# Patient Record
Sex: Female | Born: 1945 | Race: White | Hispanic: No | Marital: Married | Smoking: Never smoker
Health system: Southern US, Community
[De-identification: ages and names within clinical notes are randomized; demographics above are authoritative.]

## PROBLEM LIST (undated history)

## (undated) DIAGNOSIS — F411 Generalized anxiety disorder: Secondary | ICD-10-CM

## (undated) DIAGNOSIS — R739 Hyperglycemia, unspecified: Secondary | ICD-10-CM

## (undated) DIAGNOSIS — E785 Hyperlipidemia, unspecified: Secondary | ICD-10-CM

## (undated) DIAGNOSIS — I10 Essential (primary) hypertension: Secondary | ICD-10-CM

## (undated) DIAGNOSIS — T7840XA Allergy, unspecified, initial encounter: Secondary | ICD-10-CM

## (undated) HISTORY — PX: TONSILLECTOMY: SUR1361

## (undated) HISTORY — DX: Generalized anxiety disorder: F41.1

## (undated) HISTORY — DX: Essential (primary) hypertension: I10

## (undated) HISTORY — DX: Hyperglycemia, unspecified: R73.9

## (undated) HISTORY — DX: Hyperlipidemia, unspecified: E78.5

## (undated) HISTORY — DX: Allergy, unspecified, initial encounter: T78.40XA

---

## 2007-07-31 ENCOUNTER — Ambulatory Visit: Payer: Self-pay | Admitting: Family Medicine

## 2008-08-11 ENCOUNTER — Ambulatory Visit: Payer: Self-pay | Admitting: Gastroenterology

## 2008-08-13 ENCOUNTER — Ambulatory Visit: Payer: Self-pay | Admitting: Family Medicine

## 2009-12-28 ENCOUNTER — Ambulatory Visit: Payer: Self-pay | Admitting: Family Medicine

## 2012-06-27 ENCOUNTER — Ambulatory Visit: Payer: Self-pay | Admitting: Family Medicine

## 2012-06-27 IMAGING — MG MM CAD SCREENING MAMMO
1 series · 5 of 5 positions shown · non-contrast
Comparison: none

REASON FOR EXAM: SCR MAMMO NO ORDER
COMMENTS:

[R CC · right · 5 of 5 slices shown]
[im 1/5]
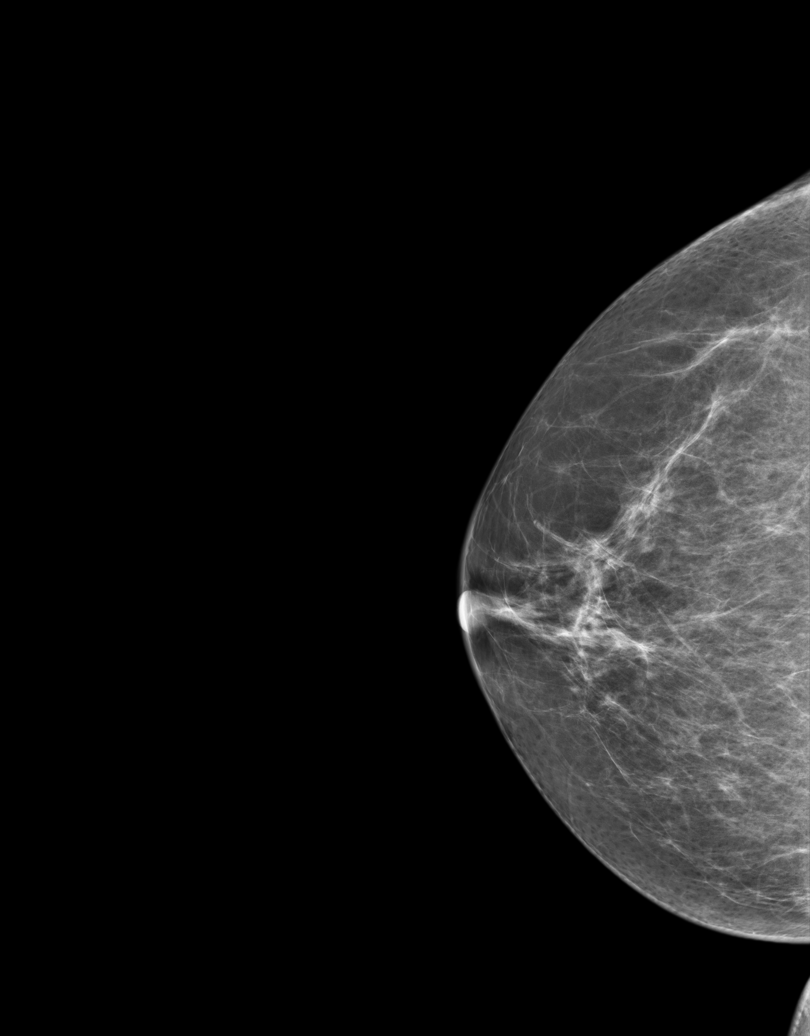
[im 2/5]
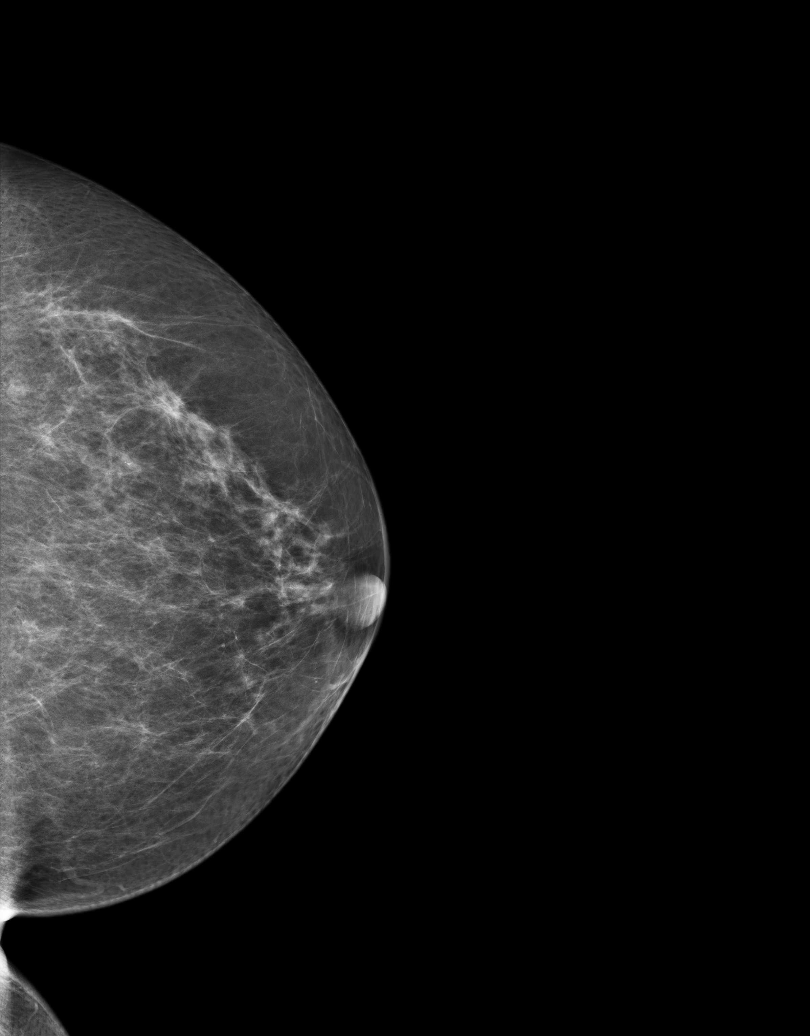
[im 3/5]
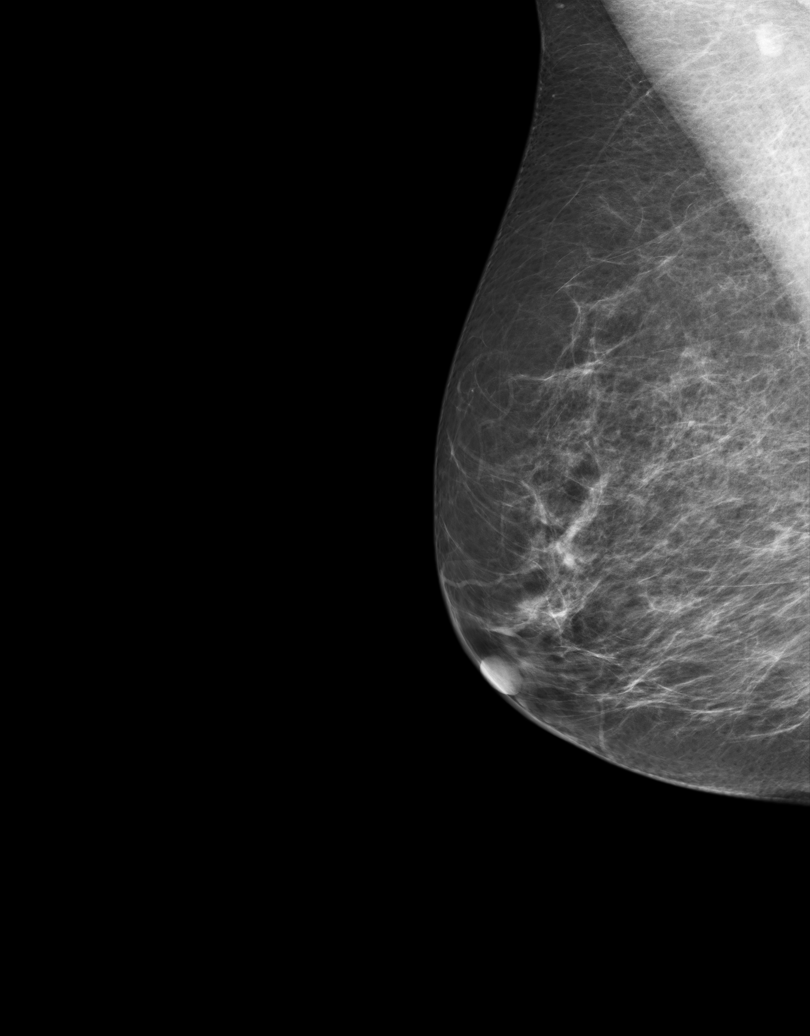
[im 4/5]
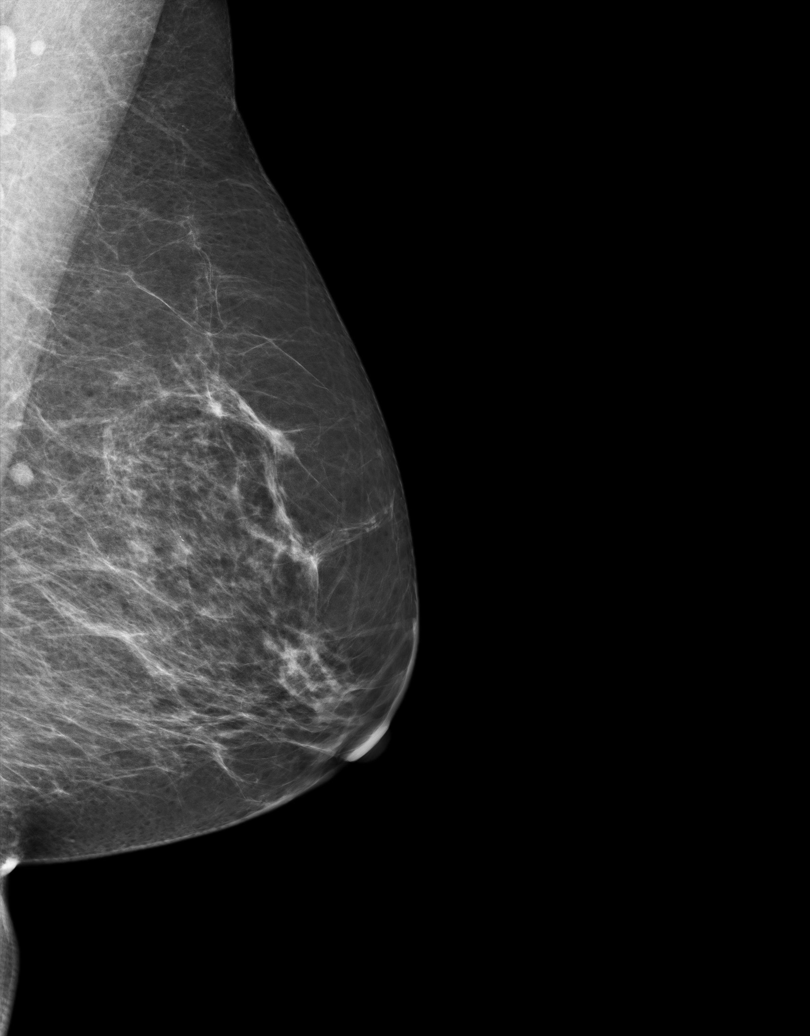
[im 5/5]
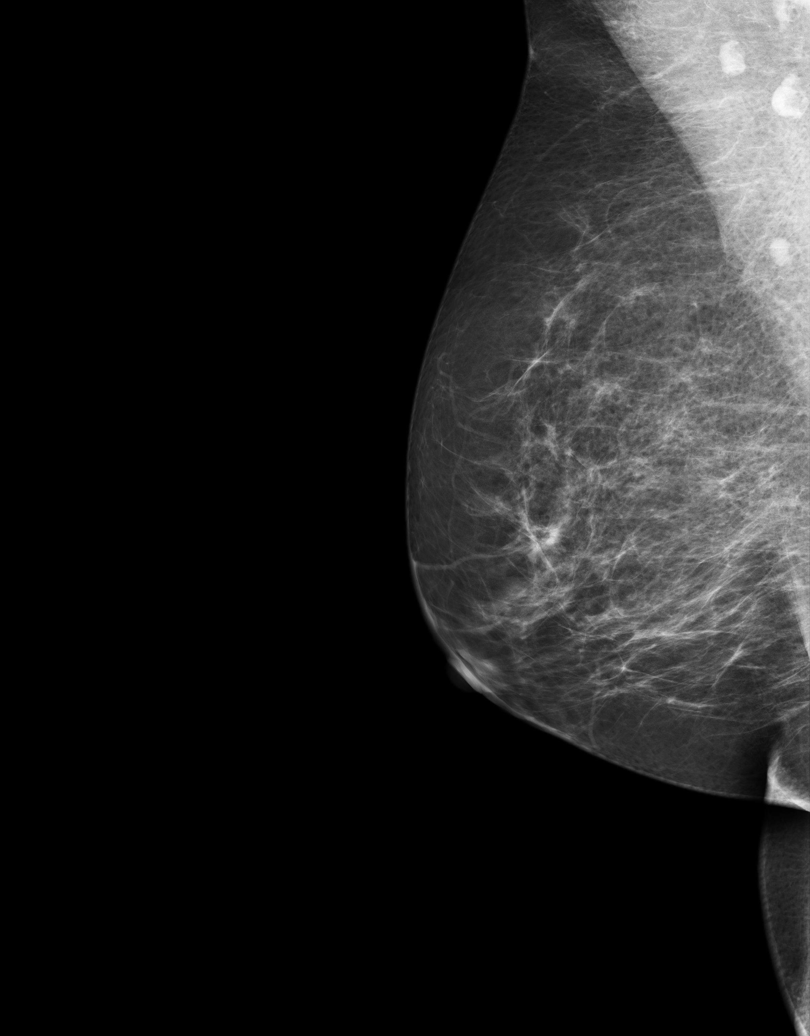

[5 of 5 positions shown; findings below may reference images not displayed]

PROCEDURE:     MAM - MAM DGTL SCRN MAM NO ORDER W/CAD  - [DATE]  [DATE]

RESULT:     There is a family history of breast cancer in the patient's
maternal aunt and maternal grandmother. The patient denies previous breast
surgery or breast cancer. Comparison is made to digital examinations
performed on [DATE] [DATE] [DATE], [DATE] [DATE] [DATE] and [DATE]. There is a
mild parenchymal density without a dominant mass or malignant appearing
calcification. There is stable nodularity in the MLO view of the left breast
posteriorly along the pectoralis region. Some stable central calcifications
are seen in the left breast laterally.
IMPRESSION: 1. Stable, benign appearing bilateral mammogram.

BI-RADS: Category 2 - Benign Finding.

Please continue to encourage annual mammographic follow-up.

A NEGATIVE MAMMOGRAM REPORT DOES NOT PRECLUDE BIOPSY OR OTHER EVALUATION OF
A CLINICALLY PALPABLE OR OTHERWISE SUSPICIOUS MASS OR LESION. BREAST CANCER
MAY NOT BE DETECTED BY MAMMOGRAPHY IN UP TO 10% OF CASES.

[REDACTED]

## 2012-10-09 ENCOUNTER — Ambulatory Visit: Payer: Self-pay | Admitting: Family Medicine

## 2013-01-13 LAB — HM MAMMOGRAPHY: HM Mammogram: NORMAL

## 2013-02-24 ENCOUNTER — Ambulatory Visit: Payer: Self-pay | Admitting: Unknown Physician Specialty

## 2013-02-26 LAB — PATHOLOGY REPORT

## 2013-03-20 LAB — HM COLONOSCOPY

## 2014-10-05 DIAGNOSIS — Z0000 Encounter for general adult medical examination without abnormal findings: Secondary | ICD-10-CM | POA: Diagnosis not present

## 2014-10-05 DIAGNOSIS — Z9181 History of falling: Secondary | ICD-10-CM | POA: Diagnosis not present

## 2014-10-05 DIAGNOSIS — Z1389 Encounter for screening for other disorder: Secondary | ICD-10-CM | POA: Diagnosis not present

## 2014-10-05 DIAGNOSIS — Z13820 Encounter for screening for osteoporosis: Secondary | ICD-10-CM | POA: Diagnosis not present

## 2014-10-05 DIAGNOSIS — Z1211 Encounter for screening for malignant neoplasm of colon: Secondary | ICD-10-CM | POA: Diagnosis not present

## 2015-04-19 ENCOUNTER — Encounter: Payer: Self-pay | Admitting: Family Medicine

## 2015-04-19 ENCOUNTER — Ambulatory Visit (INDEPENDENT_AMBULATORY_CARE_PROVIDER_SITE_OTHER): Payer: Medicare Other | Admitting: Family Medicine

## 2015-04-19 VITALS — BP 118/72 | HR 69 | Temp 97.9°F | Resp 16 | Ht 66.0 in | Wt 173.6 lb

## 2015-04-19 DIAGNOSIS — Z23 Encounter for immunization: Secondary | ICD-10-CM

## 2015-04-19 DIAGNOSIS — Z780 Asymptomatic menopausal state: Secondary | ICD-10-CM | POA: Diagnosis not present

## 2015-04-19 DIAGNOSIS — I10 Essential (primary) hypertension: Secondary | ICD-10-CM

## 2015-04-19 DIAGNOSIS — E785 Hyperlipidemia, unspecified: Secondary | ICD-10-CM | POA: Diagnosis not present

## 2015-04-19 DIAGNOSIS — R9431 Abnormal electrocardiogram [ECG] [EKG]: Secondary | ICD-10-CM

## 2015-04-19 MED ORDER — LOSARTAN POTASSIUM 100 MG PO TABS: 100.0000 mg | ORAL_TABLET | Freq: Every day | ORAL | Status: DC

## 2015-04-19 MED ORDER — SIMVASTATIN 40 MG PO TABS: 40.0000 mg | ORAL_TABLET | Freq: Every day | ORAL | Status: DC

## 2015-04-19 MED ORDER — ASPIRIN 81 MG PO TABS: 81.0000 mg | ORAL_TABLET | Freq: Every day | ORAL | Status: DC

## 2015-04-19 NOTE — Patient Instructions (Signed)
Referral to cardiologist in view of abnormal EKG and risk factors of hyperlipidemia and hypertension and age over 4.

## 2015-04-19 NOTE — Progress Notes (Signed)
Name: Hannah Lindsey   MRN: [id number]    DOB: 01-20-1946   Date:04/19/2015       Progress Note  Subjective  Chief Complaint  Chief Complaint  Patient presents with  . Hyperlipidemia    6 month follow up  . Hypertension    HPI  Hyperlipidemia  Patient has a history of hyperlipidemia for over 5 years.  Current medical regimen consist of simvastatin 40 .  Compliance is good .  Diet and exercise are currently followed moderately well .  Risk factors for cardiovascular disease include hyperlipidemia hypertension relatively sedentary lifestyle .   There have been no side effects from the medication.    Hypertension   Patient presents for follow-up of hypertension. It has been present for over 5 years.  Patient states that there is compliance with medical regimen which consists of losartan 100 mg daily . There is no end organ disease. Cardiac risk factors include hypertension hyperlipidemia and diabetes.  Exercise regimen consist of some walking .  Diet consist of low sodium low-fat the most part  Menopause  Patient is many years postmenopausal. She has no recurrent complaint of menopausal symptoms. It has been over 2 years since her last DEXA scan. .  Past Medical History  Diagnosis Date  . Hypertension   . Psoriasis   . Hyperglycemia   . Allergy   . Hyperlipidemia     Social History  Substance Use Topics  . Smoking status: Never Smoker   . Smokeless tobacco: Not on file  . Alcohol Use: No     Current outpatient prescriptions:  .  Calcium Carbonate-Vitamin D (CALCIUM-VITAMIN D) 500-200 MG-UNIT per tablet, Take 1 tablet by mouth daily., Disp: , Rfl:  .  losartan (COZAAR) 100 MG tablet, Take 1 tablet (100 mg total) by mouth daily., Disp: 90 tablet, Rfl: 1 .  simvastatin (ZOCOR) 40 MG tablet, Take 1 tablet (40 mg total) by mouth daily., Disp: 90 tablet, Rfl: 1  Allergies  Allergen Reactions  . Crestor [Rosuvastatin Calcium] Other (See Comments)    headaches    Review  of Systems  Constitutional: Negative for fever, chills and weight loss.  HENT: Negative for congestion, hearing loss, sore throat and tinnitus.   Eyes: Negative for blurred vision, double vision and redness.  Respiratory: Negative for cough, hemoptysis and shortness of breath.   Cardiovascular: Negative for chest pain, palpitations, orthopnea, claudication and leg swelling.  Gastrointestinal: Negative for heartburn, nausea, vomiting, diarrhea, constipation and blood in stool.  Genitourinary: Negative for dysuria, urgency, frequency and hematuria.  Musculoskeletal: Positive for joint pain. Negative for myalgias, back pain, falls and neck pain.  Skin: Negative for itching.  Neurological: Negative for dizziness, tingling, tremors, focal weakness, seizures, loss of consciousness, weakness and headaches.  Endo/Heme/Allergies: Does not bruise/bleed easily.  Psychiatric/Behavioral: Negative for depression and substance abuse. The patient is not nervous/anxious and does not have insomnia.      Objective  Filed Vitals:   04/19/15 0744  BP: 118/72  Pulse: 69  Temp: 97.9 F (36.6 C)  TempSrc: Oral  Resp: 16  Height: 5\' 6"  (1.676 m)  Weight: 173 lb 9.6 oz (78.744 kg)  SpO2: 98%     Physical Exam  Constitutional: She is oriented to person, place, and time and well-developed, well-nourished, and in no distress.  HENT:  Head: Normocephalic.  Eyes: EOM are normal. Pupils are equal, round, and reactive to light.  Neck: Normal range of motion. No thyromegaly present.  Cardiovascular: Normal rate,  regular rhythm and normal heart sounds.   No murmur heard. Pulmonary/Chest: Effort normal and breath sounds normal.  Abdominal: Soft. Bowel sounds are normal.  Musculoskeletal: Normal range of motion. She exhibits no edema.  Neurological: She is alert and oriented to person, place, and time. No cranial nerve deficit. Gait normal.  Skin: Skin is warm and dry. No rash noted.  Psychiatric: Memory and  affect normal.      Assessment & Plan  1. Hyperlipemia Labs today - Lipid panel  2. Essential hypertension Well-controlled - Comprehensive metabolic panel - TSH  3. Need for influenza vaccination Given today - Flu vaccine HIGH DOSE PF (Fluzone High dose)  4. Menopause Bone density - DG Bone Density; Future  5. Abnormal EKG Referral to cardiologist for consideration for cardiac evaluation as well as stress Myoview - Ambulatory referral to Cardiology

## 2015-04-20 LAB — COMPREHENSIVE METABOLIC PANEL
ALT: 28 IU/L (ref 0–32)
AST: 26 IU/L (ref 0–40)
Albumin/Globulin Ratio: 1.8 (ref 1.1–2.5)
Albumin: 4.7 g/dL (ref 3.6–4.8)
Alkaline Phosphatase: 91 IU/L (ref 39–117)
BUN/Creatinine Ratio: 25 (ref 11–26)
BUN: 21 mg/dL (ref 8–27)
Bilirubin Total: 0.7 mg/dL (ref 0.0–1.2)
CO2: 28 mmol/L (ref 18–29)
Calcium: 10 mg/dL (ref 8.7–10.3)
Chloride: 99 mmol/L (ref 97–108)
Creatinine, Ser: 0.83 mg/dL (ref 0.57–1.00)
GFR calc Af Amer: 84 mL/min/{1.73_m2} (ref >59)
GFR calc non Af Amer: 73 mL/min/{1.73_m2} (ref >59)
Globulin, Total: 2.6 g/dL (ref 1.5–4.5)
Glucose: 110 mg/dL — ABNORMAL HIGH (ref 65–99)
Potassium: 4.9 mmol/L (ref 3.5–5.2)
Sodium: 140 mmol/L (ref 134–144)
Total Protein: 7.3 g/dL (ref 6.0–8.5)

## 2015-04-20 LAB — LIPID PANEL
Chol/HDL Ratio: 3.7 ratio units (ref 0.0–4.4)
Cholesterol, Total: 188 mg/dL (ref 100–199)
HDL: 51 mg/dL (ref >39)
LDL Calculated: 113 mg/dL — ABNORMAL HIGH (ref 0–99)
Triglycerides: 118 mg/dL (ref 0–149)
VLDL Cholesterol Cal: 24 mg/dL (ref 5–40)

## 2015-04-20 LAB — TSH: TSH: 1.31 u[IU]/mL (ref 0.450–4.500)

## 2015-04-21 ENCOUNTER — Telehealth: Payer: Self-pay | Admitting: Emergency Medicine

## 2015-04-21 NOTE — Telephone Encounter (Signed)
Patient notified

## 2015-05-27 ENCOUNTER — Other Ambulatory Visit: Payer: Self-pay | Admitting: Family Medicine

## 2015-06-04 ENCOUNTER — Encounter: Payer: Self-pay | Admitting: Cardiovascular Disease

## 2015-06-04 ENCOUNTER — Ambulatory Visit (INDEPENDENT_AMBULATORY_CARE_PROVIDER_SITE_OTHER): Payer: Medicare Other | Admitting: Cardiovascular Disease

## 2015-06-04 VITALS — BP 126/70 | HR 68 | Ht 66.0 in | Wt 165.0 lb

## 2015-06-04 DIAGNOSIS — I10 Essential (primary) hypertension: Secondary | ICD-10-CM

## 2015-06-04 DIAGNOSIS — R9431 Abnormal electrocardiogram [ECG] [EKG]: Secondary | ICD-10-CM

## 2015-06-04 DIAGNOSIS — R079 Chest pain, unspecified: Secondary | ICD-10-CM | POA: Diagnosis not present

## 2015-06-04 NOTE — Assessment & Plan Note (Signed)
Rare atypical chest pain. Recent EKG was slightly abnormal likely due to lead misplacement. EKG today is normal. I requested a treadmill stress test for evaluation but overall suspicion for heart disease is low.

## 2015-06-04 NOTE — Assessment & Plan Note (Signed)
Blood pressure is controlled on losartan. 

## 2015-06-04 NOTE — Patient Instructions (Signed)
Medication Instructions:  Your physician recommends that you continue on your current medications as directed. Please refer to the Current Medication list given to you today.   Labwork: none  Testing/Procedures: Your physician has requested that you have an exercise tolerance test. For further information please visit [email or web address]. Please also follow instruction sheet, as given.    Follow-Up: Your physician recommends that you schedule a follow-up appointment with Dr. Fletcher Anon as needed.    Any Other Special Instructions Will Be Listed Below (If Applicable).     If you need a refill on your cardiac medications before your next appointment, please call your pharmacy.  Exercise Stress Electrocardiogram An exercise stress electrocardiogram is a test that is done to evaluate the blood supply to your heart. This test may also be called exercise stress electrocardiography. The test is done while you are walking on a treadmill. The goal of this test is to raise your heart rate. This test is done to find areas of poor blood flow to the heart by determining the extent of coronary artery disease (CAD).   CAD is defined as narrowing in one or more heart (coronary) arteries of more than 70%. If you have an abnormal test result, this may mean that you are not getting adequate blood flow to your heart during exercise. Additional testing may be needed to understand why your test was abnormal. LET [facility] CARE PROVIDER KNOW ABOUT:   Any allergies you have.  All medicines you are taking, including vitamins, herbs, eye drops, creams, and over-the-counter medicines.  Previous problems you or members of your family have had with the use of anesthetics.  Any blood disorders you have.  Previous surgeries you have had.  Medical conditions you have.  Possibility of pregnancy, if this applies. RISKS AND COMPLICATIONS Generally, this is a safe procedure. However, as with any procedure,  complications can occur. Possible complications can include:  Pain or pressure in the following areas:  Chest.  Jaw or neck.  Between your shoulder blades.  Radiating down your left arm.  Dizziness or light-headedness.  Shortness of breath.  Increased or irregular heartbeats.  Nausea or vomiting.  Heart attack (rare). BEFORE THE PROCEDURE  Avoid all forms of caffeine 24 hours before your test or as directed by your health care provider. This includes coffee, tea (even decaffeinated tea), caffeinated sodas, chocolate, cocoa, and certain pain medicines.  Follow your health care provider's instructions regarding eating and drinking before the test.  Take your medicines as directed at regular times with water unless instructed otherwise. Exceptions may include:  If you have diabetes, ask how you are to take your insulin or pills. It is common to adjust insulin dosing the morning of the test.  If you are taking beta-blocker medicines, it is important to talk to your health care provider about these medicines well before the date of your test. Taking beta-blocker medicines may interfere with the test. In some cases, these medicines need to be changed or stopped 24 hours or more before the test.  If you wear a nitroglycerin patch, it may need to be removed prior to the test. Ask your health care provider if the patch should be removed before the test.  If you use an inhaler for any breathing condition, bring it with you to the test.  If you are an outpatient, bring a snack so you can eat right after the stress phase of the test.  Do not smoke for 4 hours prior to the  test or as directed by your health care provider.  Do not apply lotions, powders, creams, or oils on your chest prior to the test.  Wear loose-fitting clothes and comfortable shoes for the test. This test involves walking on a treadmill. PROCEDURE  Multiple patches (electrodes) will be put on your chest. If needed,  small areas of your chest may have to be shaved to get better contact with the electrodes. Once the electrodes are attached to your body, multiple wires will be attached to the electrodes and your heart rate will be monitored.  Your heart will be monitored both at rest and while exercising.  You will walk on a treadmill. The treadmill will be started at a slow pace. The treadmill speed and incline will gradually be increased to raise your heart rate. AFTER THE PROCEDURE  Your heart rate and blood pressure will be monitored after the test.  You may return to your normal schedule including diet, activities, and medicines, unless your health care provider tells you otherwise.   This information is not intended to replace advice given to you by your health care provider. Make sure you discuss any questions you have with your health care provider.   Document Released: 07/21/2000 Document Revised: 07/29/2013 Document Reviewed: 03/31/2013 Elsevier Interactive Patient Education [employer].

## 2015-06-04 NOTE — Progress Notes (Signed)
Primary care physician: Dr. Rutherford Nail  HPI  This is a pleasant 69 year old female who was referred for evaluation of an abnormal EKG. She has no previous cardiac history. She has known history of well-controlled hypertension and hyperlipidemia. She is not a smoker. Family history is remarkable for coronary artery disease. Her father had CABG in his 75s. Her mother had rheumatic fever. The patient works at [employer] on her job is overall physical. She was found recently to have an abnormal EKG suggestive of possible old anterior MI. She reports rare episodes of chest pain described as indigestion at rest and not with physical activities. She denies any shortness of breath. No orthopnea, PND or lower extremity edema.  Allergies  Allergen Reactions  . Crestor [Rosuvastatin Calcium] Other (See Comments)    headaches     Current Outpatient Prescriptions on File Prior to Visit  Medication Sig Dispense Refill  . aspirin 81 MG tablet Take 1 tablet (81 mg total) by mouth daily. 30 tablet 12  . Calcium Carbonate-Vitamin D (CALCIUM-VITAMIN D) 500-200 MG-UNIT per tablet Take 1 tablet by mouth daily.    Marland Kitchen losartan (COZAAR) 100 MG tablet TAKE 1 TABLET BY MOUTH DAILY 90 tablet 0  . simvastatin (ZOCOR) 40 MG tablet Take 1 tablet (40 mg total) by mouth daily. 90 tablet 1   No current facility-administered medications on file prior to visit.     Past Medical History  Diagnosis Date  . Hypertension   . Psoriasis   . Hyperglycemia   . Allergy   . Hyperlipidemia      Past Surgical History  Procedure Laterality Date  . Tonsillectomy       Family History  Problem Relation Age of Onset  . Fibromyalgia Mother   . Heart Problems Mother   . Dementia Father   . Heart Problems Father   . Heart disease Father      Social History   Social History  . Marital Status: Married    Spouse Name: N/A  . Number of Children: N/A  . Years of Education: N/A   Occupational History  . Not on file.     Social History Main Topics  . Smoking status: Never Smoker   . Smokeless tobacco: Not on file  . Alcohol Use: 0.0 oz/week    0 Standard drinks or equivalent per week  . Drug Use: No  . Sexual Activity:    Partners: Male   Other Topics Concern  . Not on file   Social History Narrative     ROS A 10 point review of system was performed. It is negative other than that mentioned in the history of present illness.   PHYSICAL EXAM   BP 126/70 mmHg  Pulse 68  Ht 5\' 6"  (1.676 m)  Wt 165 lb (74.844 kg)  BMI 26.64 kg/m2 Constitutional: She is oriented to person, place, and time. She appears well-developed and well-nourished. No distress.  HENT: No nasal discharge.  Head: Normocephalic and atraumatic.  Eyes: Pupils are equal and round. No discharge.  Neck: Normal range of motion. Neck supple. No JVD present. No thyromegaly present.  Cardiovascular: Normal rate, regular rhythm, normal heart sounds. Exam reveals no gallop and no friction rub. No murmur heard.  Pulmonary/Chest: Effort normal and breath sounds normal. No stridor. No respiratory distress. She has no wheezes. She has no rales. She exhibits no tenderness.  Abdominal: Soft. Bowel sounds are normal. She exhibits no distension. There is no tenderness. There is no rebound and  no guarding.  Musculoskeletal: Normal range of motion. She exhibits no edema and no tenderness.  Neurological: She is alert and oriented to person, place, and time. Coordination normal.  Skin: Skin is warm and dry. No rash noted. She is not diaphoretic. No erythema. No pallor.  Psychiatric: She has a normal mood and affect. Her behavior is normal. Judgment and thought content normal.     EKG: Normal sinus rhythm with no significant ST or T wave changes   ASSESSMENT AND PLAN

## 2015-06-07 ENCOUNTER — Ambulatory Visit (INDEPENDENT_AMBULATORY_CARE_PROVIDER_SITE_OTHER): Payer: Medicare Other

## 2015-06-07 DIAGNOSIS — R079 Chest pain, unspecified: Secondary | ICD-10-CM

## 2015-06-09 LAB — EXERCISE TOLERANCE TEST
Estimated workload: 9.9 METS
Exercise duration (min): 7 min
Exercise duration (sec): 56 s
MPHR: 152 {beats}/min
Peak HR: 130 {beats}/min
Percent HR: 85 %
Rest HR: 79 {beats}/min

## 2015-09-05 ENCOUNTER — Other Ambulatory Visit: Payer: Self-pay | Admitting: Family Medicine

## 2015-10-18 ENCOUNTER — Ambulatory Visit: Payer: Medicare Other | Admitting: Family Medicine

## 2015-11-24 ENCOUNTER — Ambulatory Visit: Payer: Medicare Other | Admitting: Family Medicine

## 2016-05-22 ENCOUNTER — Telehealth: Payer: Self-pay | Admitting: Family Medicine

## 2016-05-22 NOTE — Telephone Encounter (Signed)
Called Pt to schedule AWV with NHA and CPE with Manuella Ghazi for 10/23-knb

## 2016-05-29 ENCOUNTER — Ambulatory Visit (INDEPENDENT_AMBULATORY_CARE_PROVIDER_SITE_OTHER): Payer: Medicare Other | Admitting: Family Medicine

## 2016-05-29 VITALS — BP 145/72 | HR 68 | Temp 97.9°F | Ht 64.75 in | Wt 166.5 lb

## 2016-05-29 DIAGNOSIS — Z1159 Encounter for screening for other viral diseases: Secondary | ICD-10-CM

## 2016-05-29 DIAGNOSIS — E162 Hypoglycemia, unspecified: Secondary | ICD-10-CM | POA: Diagnosis not present

## 2016-05-29 DIAGNOSIS — Z23 Encounter for immunization: Secondary | ICD-10-CM | POA: Diagnosis not present

## 2016-05-29 DIAGNOSIS — E7800 Pure hypercholesterolemia, unspecified: Secondary | ICD-10-CM

## 2016-05-29 DIAGNOSIS — Z0000 Encounter for general adult medical examination without abnormal findings: Secondary | ICD-10-CM | POA: Diagnosis not present

## 2016-05-29 LAB — HEPATITIS C ANTIBODY: HCV Ab: NEGATIVE

## 2016-05-29 LAB — TSH: TSH: 1.09 mIU/L

## 2016-05-29 NOTE — Patient Instructions (Signed)
Preventive Care for Adults  A healthy lifestyle and preventive care can promote health and wellness. Preventive health guidelines for adults include the following key practices.  . A routine yearly physical is a good way to check with your health care provider about your health and preventive screening. It is a chance to share any concerns and updates on your health and to receive a thorough exam.  . Visit your dentist for a routine exam and preventive care every 6 months. Brush your teeth twice a day and floss once a day. Good oral hygiene prevents tooth decay and gum disease.  . The frequency of eye exams is based on your age, health, family medical history, use  of contact lenses, and other factors. Follow your health care provider's ecommendations for frequency of eye exams.  . Eat a healthy diet. Foods like vegetables, fruits, whole grains, low-fat dairy products, and lean protein foods contain the nutrients you need without too many calories. Decrease your intake of foods high in solid fats, added sugars, and salt. Eat the right amount of calories for you. Get information about a proper diet from your health care provider, if necessary.  . Regular physical exercise is one of the most important things you can do for your health. Most adults should get at least 150 minutes of moderate-intensity exercise (any activity that increases your heart rate and causes you to sweat) each week. In addition, most adults need muscle-strengthening exercises on 2 or more days a week.  Silver Sneakers may be a benefit available to you. To determine eligibility, you may visit the website: www.silversneakers.com or contact program at 1-866-584-7389 Mon-Fri between 8AM-8PM.   . Maintain a healthy weight. The body mass index (BMI) is a screening tool to identify possible weight problems. It provides an estimate of body fat based on height and weight. Your health care provider can find your BMI and can help you  achieve or maintain a healthy weight.   For adults 20 years and older: ? A BMI below 18.5 is considered underweight. ? A BMI of 18.5 to 24.9 is normal. ? A BMI of 25 to 29.9 is considered overweight. ? A BMI of 30 and above is considered obese.   . Maintain normal blood lipids and cholesterol levels by exercising and minimizing your intake of saturated fat. Eat a balanced diet with plenty of fruit and vegetables. Blood tests for lipids and cholesterol should begin at age 20 and be repeated every 5 years. If your lipid or cholesterol levels are high, you are over 50, or you are at high risk for heart disease, you may need your cholesterol levels checked more frequently. Ongoing high lipid and cholesterol levels should be treated with medicines if diet and exercise are not working.  . If you smoke, find out from your health care provider how to quit. If you do not use tobacco, please do not start.  . If you choose to drink alcohol, please do not consume more than 2 drinks per day. One drink is considered to be 12 ounces (355 mL) of beer, 5 ounces (148 mL) of wine, or 1.5 ounces (44 mL) of liquor.  . If you are 55-79 years old, ask your health care provider if you should take aspirin to prevent strokes.  . Use sunscreen. Apply sunscreen liberally and repeatedly throughout the day. You should seek shade when your shadow is shorter than you. Protect yourself by wearing long sleeves, pants, a wide-brimmed hat, and sunglasses year   round, whenever you are outdoors.  . Once a month, do a whole body skin exam, using a mirror to look at the skin on your back. Tell your health care provider of new moles, moles that have irregular borders, moles that are larger than a pencil eraser, or moles that have changed in shape or color.     Hannah Lindsey , Thank you for taking time to come for your Medicare Wellness Visit. I appreciate your ongoing commitment to your health goals. Please review the following plan  we discussed and let me know if I can assist you in the future.   These are the goals we discussed: Goals    . water intake          Starting 05/29/16, I will continue to drink 6-8 glasses of water a day.       This is a list of the screening recommended for you and due dates:  Health Maintenance  Topic Date Due  . Mammogram  08/06/2016*  .  Hepatitis C: One time screening is recommended by Center for Disease Control  (CDC) for  adults born from [birth year] through 1964.   08/06/2016*  . Tetanus Vaccine  02/28/2022  . Colon Cancer Screening  03/21/2023  . Flu Shot  Completed  . DEXA scan (bone density measurement)  Completed  . Shingles Vaccine  Completed  . Pneumonia vaccines  Completed  *Topic was postponed. The date shown is not the original due date.

## 2016-05-29 NOTE — Progress Notes (Signed)
Subjective:   Hannah Lindsey is a 70 y.o. female who presents for Medicare Annual (Subsequent) preventive examination.  Review of Systems:  N/A Cardiac Risk Factors include: advanced age (>26men, >48 women);dyslipidemia;hypertension     Objective:     Vitals: BP (!) 145/72 (BP Location: Left Arm)   Pulse 68   Temp 97.9 F (36.6 C) (Oral)   Ht 5' 4.75" (1.645 m)   Wt 166 lb 8 oz (75.5 kg)   BMI 27.92 kg/m   Body mass index is 27.92 kg/m.   Tobacco History  Smoking Status  . Never Smoker  Smokeless Tobacco  . Never Used     Counseling given: No   Past Medical History:  Diagnosis Date  . Allergy   . Hyperglycemia   . Hyperlipidemia   . Hypertension   . Psoriasis    Past Surgical History:  Procedure Laterality Date  . TONSILLECTOMY     Family History  Problem Relation Age of Onset  . Fibromyalgia Mother   . Heart Problems Mother   . Dementia Father   . Heart Problems Father   . Heart disease Father    History  Sexual Activity  . Sexual activity: Yes  . Partners: Male    Outpatient Encounter Prescriptions as of 05/29/2016  Medication Sig  . diphenhydrAMINE (BENADRYL) 25 MG tablet Take 25 mg by mouth every 6 (six) hours as needed (as needed).  Marland Kitchen aspirin 81 MG tablet Take 1 tablet (81 mg total) by mouth daily.  . Calcium Carbonate-Vitamin D (CALCIUM-VITAMIN D) 500-200 MG-UNIT per tablet Take 1 tablet by mouth daily.  Marland Kitchen losartan (COZAAR) 100 MG tablet TAKE 1 TABLET BY MOUTH DAILY  . simvastatin (ZOCOR) 40 MG tablet TAKE 1 TABLET (40 MG TOTAL) BY MOUTH DAILY.   No facility-administered encounter medications on file as of 05/29/2016.     Activities of Daily Living In your present state of health, do you have any difficulty performing the following activities: 05/29/2016  Hearing? N  Vision? N  Difficulty concentrating or making decisions? N  Walking or climbing stairs? N  Dressing or bathing? N  Doing errands, shopping? N  Preparing Food and  eating ? N  Using the Toilet? N  In the past six months, have you accidently leaked urine? Y  Do you have problems with loss of bowel control? N  Managing your Medications? N  Managing your Finances? N  Housekeeping or managing your Housekeeping? N  Some recent data might be hidden    Patient Care Team: Ashok Norris, MD as PCP - General (Family Medicine)    Assessment:     Exercise Activities and Dietary recommendations Current Exercise Habits: Home exercise routine, Type of exercise: walking, Time (Minutes): 30, Frequency (Times/Week): 4, Weekly Exercise (Minutes/Week): 120, Intensity: Mild  Goals    . water intake          Starting 05/29/16, I will continue to drink 6-8 glasses of water a day.      Fall Risk Fall Risk  05/29/2016 04/19/2015 04/19/2015  Falls in the past year? No No No   Depression Screen PHQ 2/9 Scores 05/29/2016 04/19/2015  PHQ - 2 Score 0 0     Cognitive Function     6CIT Screen 05/29/2016  What Year? 0 points  What month? 0 points  What time? 0 points  Count back from 20 0 points  Months in reverse 0 points  Repeat phrase 2 points  Total Score 2  Immunization History  Administered Date(s) Administered  . Influenza, High Dose Seasonal PF 04/19/2015  . Influenza-Unspecified 04/07/2014  . Pneumococcal Conjugate-13 10/08/2013  . Pneumococcal Polysaccharide-23 02/29/2012  . Tdap 02/29/2012  . Zoster 04/14/2013   Screening Tests Health Maintenance  Topic Date Due  . MAMMOGRAM  08/06/2016 (Originally 01/14/2015)  . Hepatitis C Screening  08/06/2016 (Originally 1945-11-12)  . TETANUS/TDAP  02/28/2022  . COLONOSCOPY  03/21/2023  . INFLUENZA VACCINE  Completed  . DEXA SCAN  Completed  . ZOSTAVAX  Completed  . PNA vac Low Risk Adult  Completed      6CIT Screen 05/29/2016  What Year? 0 points  What month? 0 points  What time? 0 points  Count back from 20 0 points  Months in reverse 0 points  Repeat phrase 2 points  Total Score 2      Plan:    I have personally reviewed and addressed the Medicare Annual Wellness questionnaire and have noted the following in the patient's chart:  A. Medical and social history B. Use of alcohol, tobacco or illicit drugs  C. Current medications and supplements D. Functional ability and status E.  Nutritional status F.  Physical activity G. Advance directives H. List of other physicians I.  Hospitalizations, surgeries, and ER visits in previous 12 months J.  Roberts such as hearing and vision if needed, cognitive and depression L. Referrals and appointments - none  In addition, I have reviewed and discussed with patient certain preventive protocols, quality metrics, and best practice recommendations. A written personalized care plan for preventive services as well as general preventive health recommendations were provided to patient.  See attached scanned questionnaire for additional information.   Signed,  Fabio Neighbors, LPN Nurse Health Advisor

## 2016-05-30 LAB — LIPID PANEL
Cholesterol: 184 mg/dL (ref 125–200)
HDL: 59 mg/dL (ref >=46)
LDL Cholesterol: 103 mg/dL (ref <130)
Total CHOL/HDL Ratio: 3.1 Ratio (ref <=5.0)
Triglycerides: 109 mg/dL (ref <150)
VLDL: 22 mg/dL (ref <30)

## 2016-05-30 LAB — HEMOGLOBIN A1C
Hgb A1c MFr Bld: 5 % (ref <5.7)
Mean Plasma Glucose: 97 mg/dL

## 2016-06-19 ENCOUNTER — Encounter: Payer: Self-pay | Admitting: Family Medicine

## 2016-06-19 ENCOUNTER — Ambulatory Visit (INDEPENDENT_AMBULATORY_CARE_PROVIDER_SITE_OTHER): Payer: Medicare Other | Admitting: Family Medicine

## 2016-06-19 VITALS — BP 141/76 | HR 81 | Temp 98.2°F | Resp 17 | Ht 65.0 in | Wt 167.5 lb

## 2016-06-19 DIAGNOSIS — E785 Hyperlipidemia, unspecified: Secondary | ICD-10-CM | POA: Diagnosis not present

## 2016-06-19 DIAGNOSIS — I10 Essential (primary) hypertension: Secondary | ICD-10-CM | POA: Diagnosis not present

## 2016-06-19 MED ORDER — LOSARTAN POTASSIUM 100 MG PO TABS: 100.0000 mg | ORAL_TABLET | Freq: Every day | ORAL | 0 refills | Status: DC

## 2016-06-19 MED ORDER — SIMVASTATIN 40 MG PO TABS: ORAL_TABLET | ORAL | 0 refills | Status: DC

## 2016-06-19 NOTE — Progress Notes (Signed)
Name: Hannah Lindsey   MRN: [id number]    DOB: [birth year]   Date:06/19/2016       Progress Note  Subjective  Chief Complaint  Chief Complaint  Patient presents with  . Medication Refill    simvastatin / losartan    Hypertension  This is a chronic problem. The problem is unchanged. The problem is controlled. Pertinent negatives include no blurred vision, chest pain, headaches, palpitations or shortness of breath. Past treatments include angiotensin blockers. There is no history of kidney disease, CAD/MI or CVA.  Hyperlipidemia  This is a chronic problem. The problem is controlled. Recent lipid tests were reviewed and are normal. Pertinent negatives include no chest pain, leg pain, myalgias or shortness of breath. Current antihyperlipidemic treatment includes statins. Risk factors for coronary artery disease include dyslipidemia.    Past Medical History:  Diagnosis Date  . Allergy   . Hyperglycemia   . Hyperlipidemia   . Hypertension   . Psoriasis     Past Surgical History:  Procedure Laterality Date  . TONSILLECTOMY      Family History  Problem Relation Age of Onset  . Fibromyalgia Mother   . Heart Problems Mother   . Dementia Father   . Heart Problems Father   . Heart disease Father     Social History   Social History  . Marital status: Married    Spouse name: N/A  . Number of children: N/A  . Years of education: N/A   Occupational History  . Not on file.   Social History Main Topics  . Smoking status: Never Smoker  . Smokeless tobacco: Never Used  . Alcohol use 0.6 oz/week    1 Glasses of wine per week     Comment: nightly  . Drug use: No  . Sexual activity: Yes    Partners: Male   Other Topics Concern  . Not on file   Social History Narrative  . No narrative on file     Current Outpatient Prescriptions:  .  aspirin 81 MG tablet, Take 1 tablet (81 mg total) by mouth daily., Disp: 30 tablet, Rfl: 12 .  Calcium Carbonate-Vitamin D  (CALCIUM-VITAMIN D) 500-200 MG-UNIT per tablet, Take 1 tablet by mouth daily., Disp: , Rfl:  .  diphenhydrAMINE (BENADRYL) 25 MG tablet, Take 25 mg by mouth every 6 (six) hours as needed (as needed)., Disp: , Rfl:  .  losartan (COZAAR) 100 MG tablet, TAKE 1 TABLET BY MOUTH DAILY, Disp: 90 tablet, Rfl: 0 .  simvastatin (ZOCOR) 40 MG tablet, TAKE 1 TABLET (40 MG TOTAL) BY MOUTH DAILY., Disp: 90 tablet, Rfl: 0  Allergies  Allergen Reactions  . Crestor [Rosuvastatin Calcium] Other (See Comments)    headaches     Review of Systems  Eyes: Negative for blurred vision.  Respiratory: Negative for shortness of breath.   Cardiovascular: Negative for chest pain and palpitations.  Musculoskeletal: Negative for myalgias.  Neurological: Negative for headaches.    Objective  Vitals:   06/19/16 1022  BP: (!) 141/76  Pulse: 81  Resp: 17  Temp: 98.2 F (36.8 C)  TempSrc: Oral  SpO2: 97%  Weight: 167 lb 8 oz (76 kg)  Height: 5\' 5"  (1.651 m)    Physical Exam  Constitutional: She is well-developed, well-nourished, and in no distress.  Cardiovascular: Normal rate, regular rhythm, S1 normal and S2 normal.   No murmur heard. Pulmonary/Chest: Breath sounds normal. She has no wheezes. She has no rhonchi.  Abdominal: Soft. Bowel  sounds are normal. There is no tenderness.  Musculoskeletal:       Right ankle: She exhibits no swelling.       Left ankle: She exhibits no swelling.  Psychiatric: Mood, memory, affect and judgment normal.  Nursing note and vitals reviewed.    Assessment & Plan  1. Essential hypertension BP stable and controlled on present therapy - losartan (COZAAR) 100 MG tablet; Take 1 tablet (100 mg total) by mouth daily.  Dispense: 90 tablet; Refill: 0  2. Hyperlipidemia, unspecified hyperlipidemia type FLP at goal, continue on statin - simvastatin (ZOCOR) 40 MG tablet; TAKE 1 TABLET (40 MG TOTAL) BY MOUTH DAILY.  Dispense: 90 tablet; Refill: 0   Toyna Erisman Asad A.  [facility] 06/19/2016 10:39 AM

## 2016-06-26 ENCOUNTER — Other Ambulatory Visit: Payer: Self-pay | Admitting: Family Medicine

## 2016-06-26 DIAGNOSIS — I10 Essential (primary) hypertension: Secondary | ICD-10-CM

## 2016-06-30 ENCOUNTER — Other Ambulatory Visit: Payer: Self-pay | Admitting: Family Medicine

## 2016-06-30 DIAGNOSIS — E785 Hyperlipidemia, unspecified: Secondary | ICD-10-CM

## 2016-08-11 ENCOUNTER — Encounter: Payer: Medicare Other | Admitting: Family Medicine

## 2016-09-06 ENCOUNTER — Encounter: Payer: Self-pay | Admitting: Family Medicine

## 2016-09-06 ENCOUNTER — Ambulatory Visit (INDEPENDENT_AMBULATORY_CARE_PROVIDER_SITE_OTHER): Payer: Medicare Other | Admitting: Family Medicine

## 2016-09-06 DIAGNOSIS — Z780 Asymptomatic menopausal state: Secondary | ICD-10-CM

## 2016-09-06 DIAGNOSIS — Z1239 Encounter for other screening for malignant neoplasm of breast: Secondary | ICD-10-CM

## 2016-09-06 DIAGNOSIS — Z1231 Encounter for screening mammogram for malignant neoplasm of breast: Secondary | ICD-10-CM

## 2016-09-06 DIAGNOSIS — Z0000 Encounter for general adult medical examination without abnormal findings: Secondary | ICD-10-CM

## 2016-09-06 NOTE — Progress Notes (Signed)
Name: Hannah Lindsey   MRN: [id number]    DOB: 26-May-1946   Date:09/06/2016       Progress Note  Subjective  Chief Complaint  Chief Complaint  Patient presents with  . Annual Exam    HPI  Pt. Presents for Complete Physical Exam She will need mammogram and DEXA scan. She is up to date with colonoscopy, due in 2021.  Past Medical History:  Diagnosis Date  . Allergy   . Hyperglycemia   . Hyperlipidemia   . Hypertension   . Psoriasis     Past Surgical History:  Procedure Laterality Date  . TONSILLECTOMY      Family History  Problem Relation Age of Onset  . Fibromyalgia Mother   . Heart Problems Mother   . Dementia Father   . Heart Problems Father   . Heart disease Father     Social History   Social History  . Marital status: Married    Spouse name: N/A  . Number of children: N/A  . Years of education: N/A   Occupational History  . Not on file.   Social History Main Topics  . Smoking status: Never Smoker  . Smokeless tobacco: Never Used  . Alcohol use 0.6 oz/week    1 Glasses of wine per week     Comment: nightly  . Drug use: No  . Sexual activity: Yes    Partners: Male   Other Topics Concern  . Not on file   Social History Narrative  . No narrative on file     Current Outpatient Prescriptions:  .  aspirin 81 MG tablet, Take 1 tablet (81 mg total) by mouth daily., Disp: 30 tablet, Rfl: 12 .  Calcium Carbonate-Vitamin D (CALCIUM-VITAMIN D) 500-200 MG-UNIT per tablet, Take 1 tablet by mouth daily., Disp: , Rfl:  .  diphenhydrAMINE (BENADRYL) 25 MG tablet, Take 25 mg by mouth every 6 (six) hours as needed (as needed)., Disp: , Rfl:  .  losartan (COZAAR) 100 MG tablet, Take 1 tablet (100 mg total) by mouth daily., Disp: 90 tablet, Rfl: 0 .  simvastatin (ZOCOR) 40 MG tablet, TAKE 1 TABLET (40 MG TOTAL) BY MOUTH DAILY., Disp: 90 tablet, Rfl: 0  Allergies  Allergen Reactions  . Crestor [Rosuvastatin Calcium] Other (See Comments)    headaches      Review of Systems  Constitutional: Negative for chills, fever, malaise/fatigue and weight loss.  HENT: Negative for congestion, ear pain and sinus pain.   Eyes: Negative for blurred vision and double vision.  Respiratory: Negative for cough, shortness of breath and wheezing.   Cardiovascular: Negative for chest pain and leg swelling.  Gastrointestinal: Negative for blood in stool, constipation, nausea and vomiting.  Genitourinary: Negative for dysuria and hematuria.  Musculoskeletal: Positive for back pain (occasional back spasms). Negative for joint pain and neck pain.  Neurological: Negative for headaches.  Psychiatric/Behavioral: Negative for depression. The patient is not nervous/anxious and does not have insomnia.     Objective  Vitals:   09/06/16 1043  BP: 112/68  Pulse: 77  Resp: 16  Temp: 98.1 F (36.7 C)  TempSrc: Oral  SpO2: 97%  Weight: 168 lb 4.8 oz (76.3 kg)  Height: 5\' 5"  (1.651 m)    Physical Exam  Constitutional: She is oriented to person, place, and time and well-developed, well-nourished, and in no distress.  HENT:  Head: Normocephalic and atraumatic.  Right Ear: Tympanic membrane and ear canal normal. No drainage or swelling.  Mouth/Throat: No  posterior oropharyngeal erythema.  Left ear canal with cerumen impaction.  Eyes: Pupils are equal, round, and reactive to light.  Cardiovascular: Normal rate, regular rhythm, S1 normal, S2 normal and normal heart sounds.   No murmur heard. Pulmonary/Chest: Effort normal and breath sounds normal. She has no wheezes.  Abdominal: Soft. Bowel sounds are normal. There is no tenderness.  Musculoskeletal:       Right ankle: She exhibits no swelling.       Left ankle: She exhibits no swelling.  Neurological: She is alert and oriented to person, place, and time.  Skin: Skin is warm, dry and intact.  Psychiatric: Mood, memory, affect and judgment normal.  Nursing note and vitals reviewed.    Assessment &  Plan  1. Well woman exam (no gynecological exam)  2. Screening for breast cancer  - MM DIGITAL SCREENING BILATERAL; Future  3. Post-menopausal  - DG Bone Density; Future   Saga Balthazar Asad A. [facility] 09/06/2016 11:05 AM

## 2016-10-19 ENCOUNTER — Other Ambulatory Visit: Payer: Self-pay | Admitting: Family Medicine

## 2016-10-19 DIAGNOSIS — I10 Essential (primary) hypertension: Secondary | ICD-10-CM

## 2016-10-19 DIAGNOSIS — E785 Hyperlipidemia, unspecified: Secondary | ICD-10-CM

## 2016-10-19 NOTE — Telephone Encounter (Signed)
Last Cr, K+, and ALT were 2016; please ask patient to stop by NON-fasting sometime this week or next to just check kidney and liver tests on these medicines I'll be glad to re-order if she'll be able to do that for [place] [facility]: please enter CMP with GFR, diagnosis medication monitor encounter Thank you

## 2016-12-06 ENCOUNTER — Ambulatory Visit
Admission: RE | Admit: 2016-12-06 | Discharge: 2016-12-06 | Disposition: A | Discharge status: Home / Self Care | Payer: Medicare Other | Source: Ambulatory Visit | Attending: Family Medicine | Admitting: Family Medicine

## 2016-12-06 DIAGNOSIS — M8588 Other specified disorders of bone density and structure, other site: Secondary | ICD-10-CM | POA: Diagnosis not present

## 2016-12-06 DIAGNOSIS — Z780 Asymptomatic menopausal state: Secondary | ICD-10-CM

## 2016-12-06 DIAGNOSIS — Z1239 Encounter for other screening for malignant neoplasm of breast: Secondary | ICD-10-CM

## 2016-12-06 DIAGNOSIS — Z1231 Encounter for screening mammogram for malignant neoplasm of breast: Secondary | ICD-10-CM | POA: Insufficient documentation

## 2016-12-06 IMAGING — MG MM DIGITAL SCREENING BILAT W/ CAD
4 series · 4 of 4 positions shown · non-contrast
Comparison: Previous exam(s).

CLINICAL DATA: Screening.

EXAM:
DIGITAL SCREENING BILATERAL MAMMOGRAM WITH CAD

[R CC]
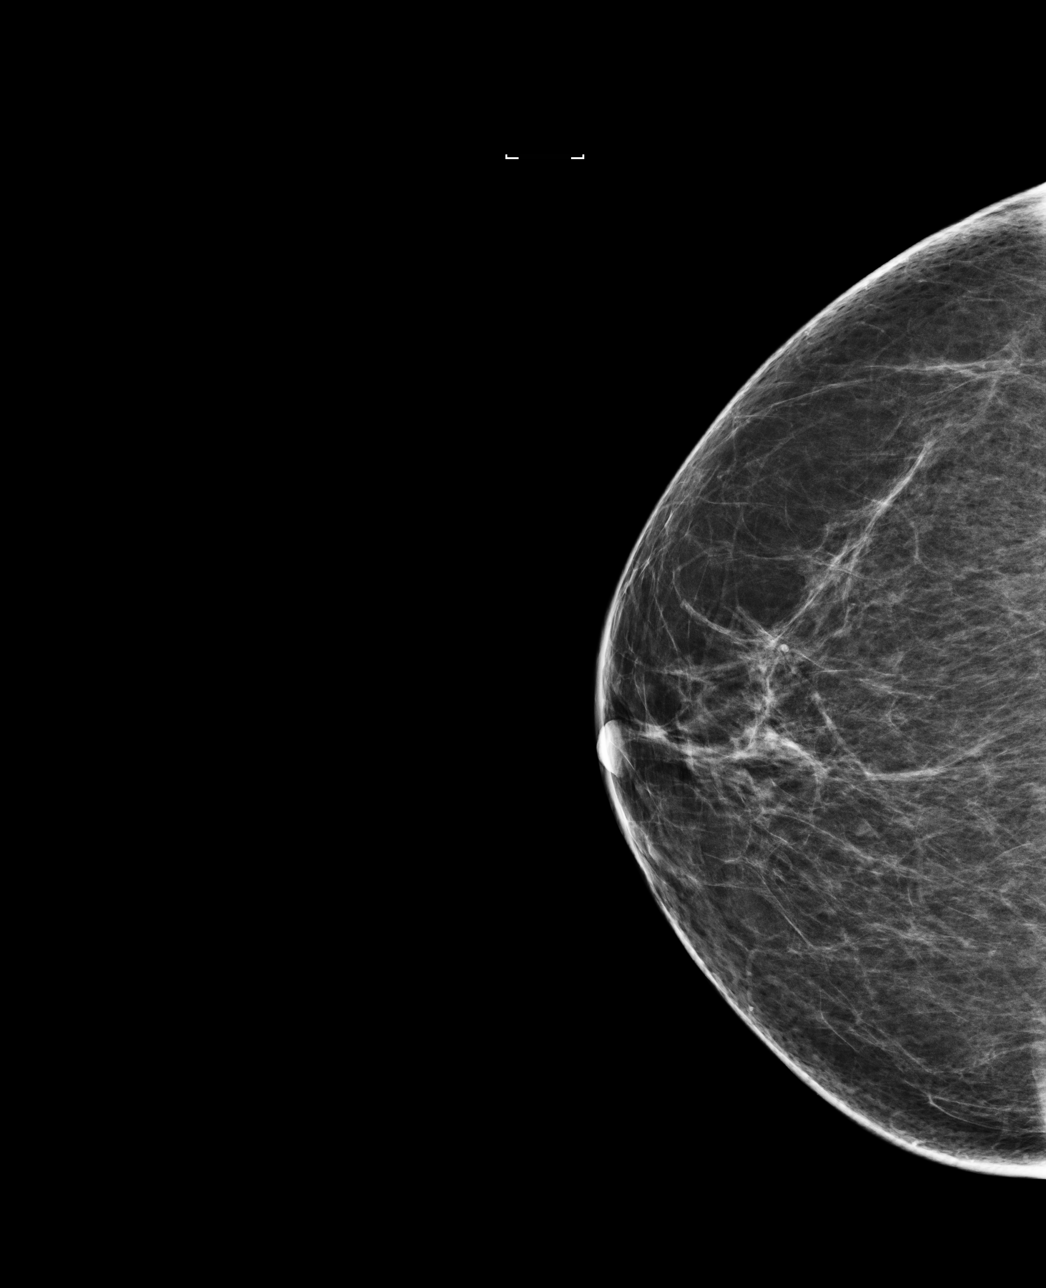

[L MLO]
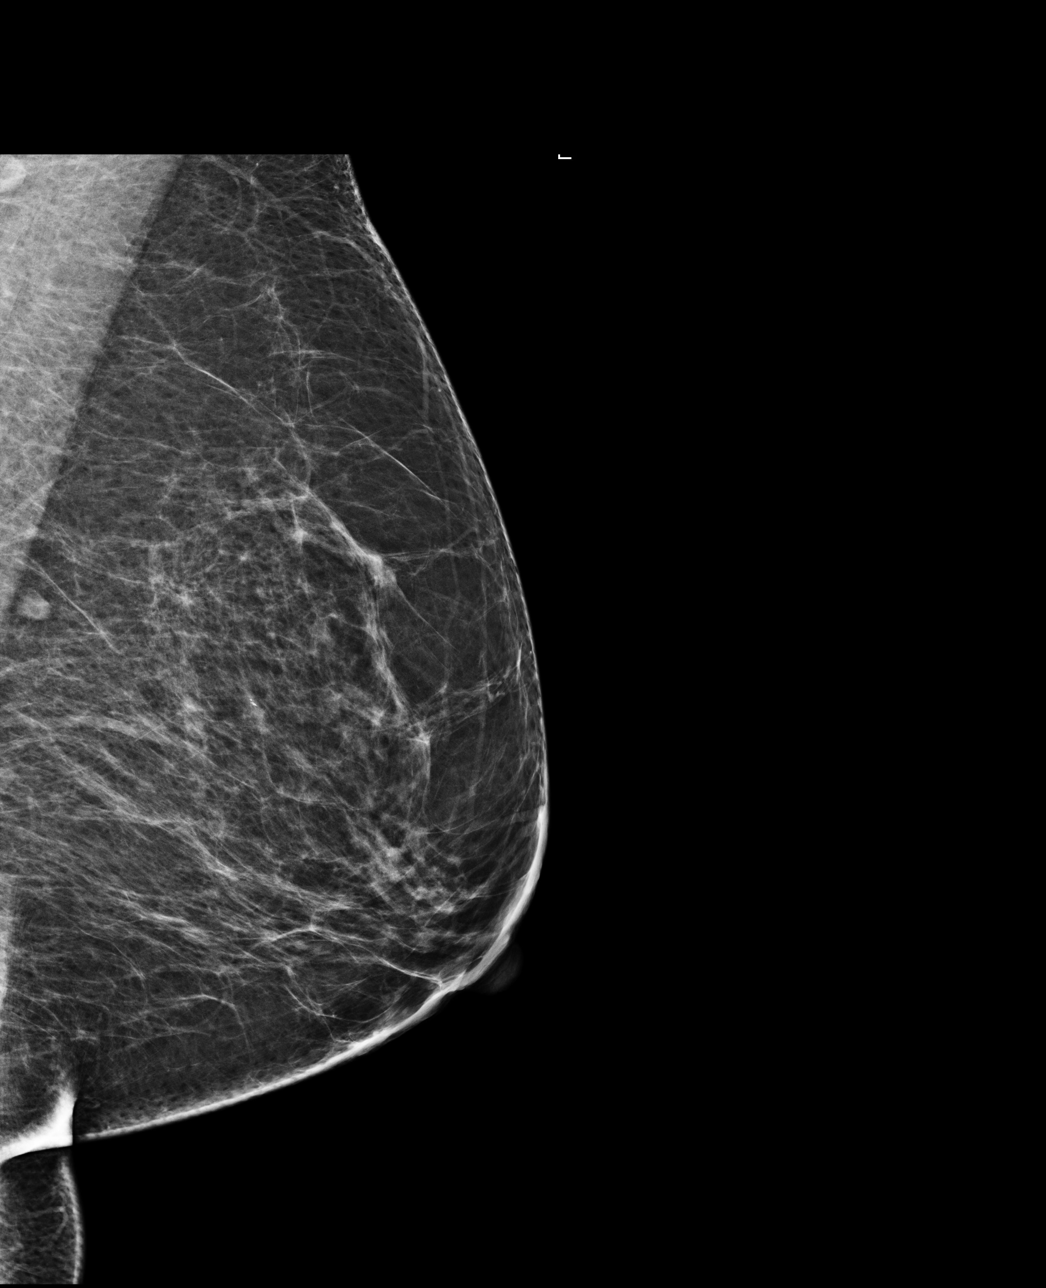

[R MLO]
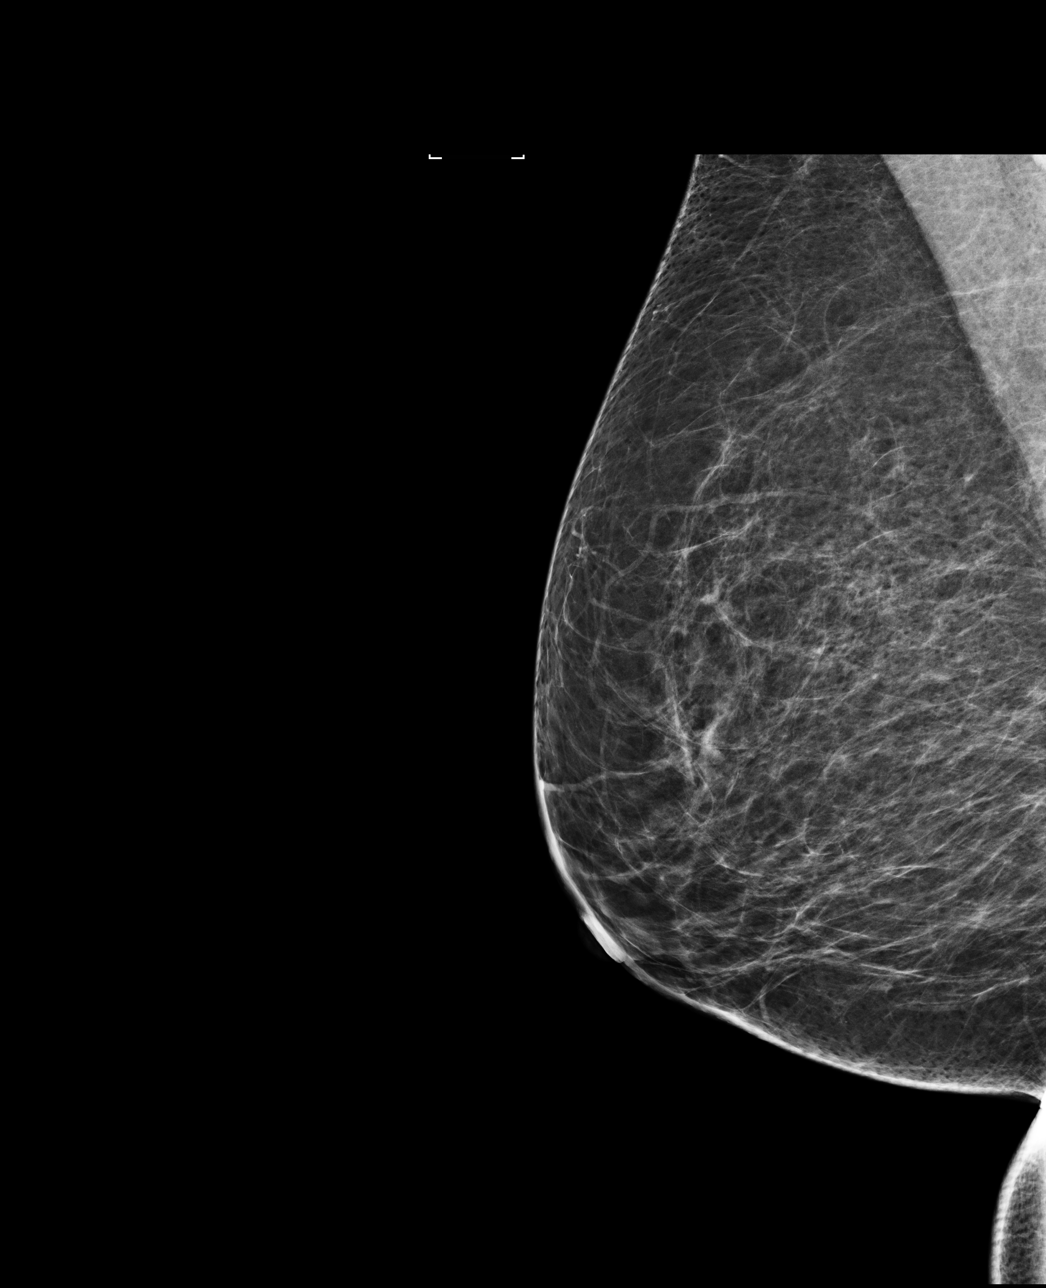

[L CC]
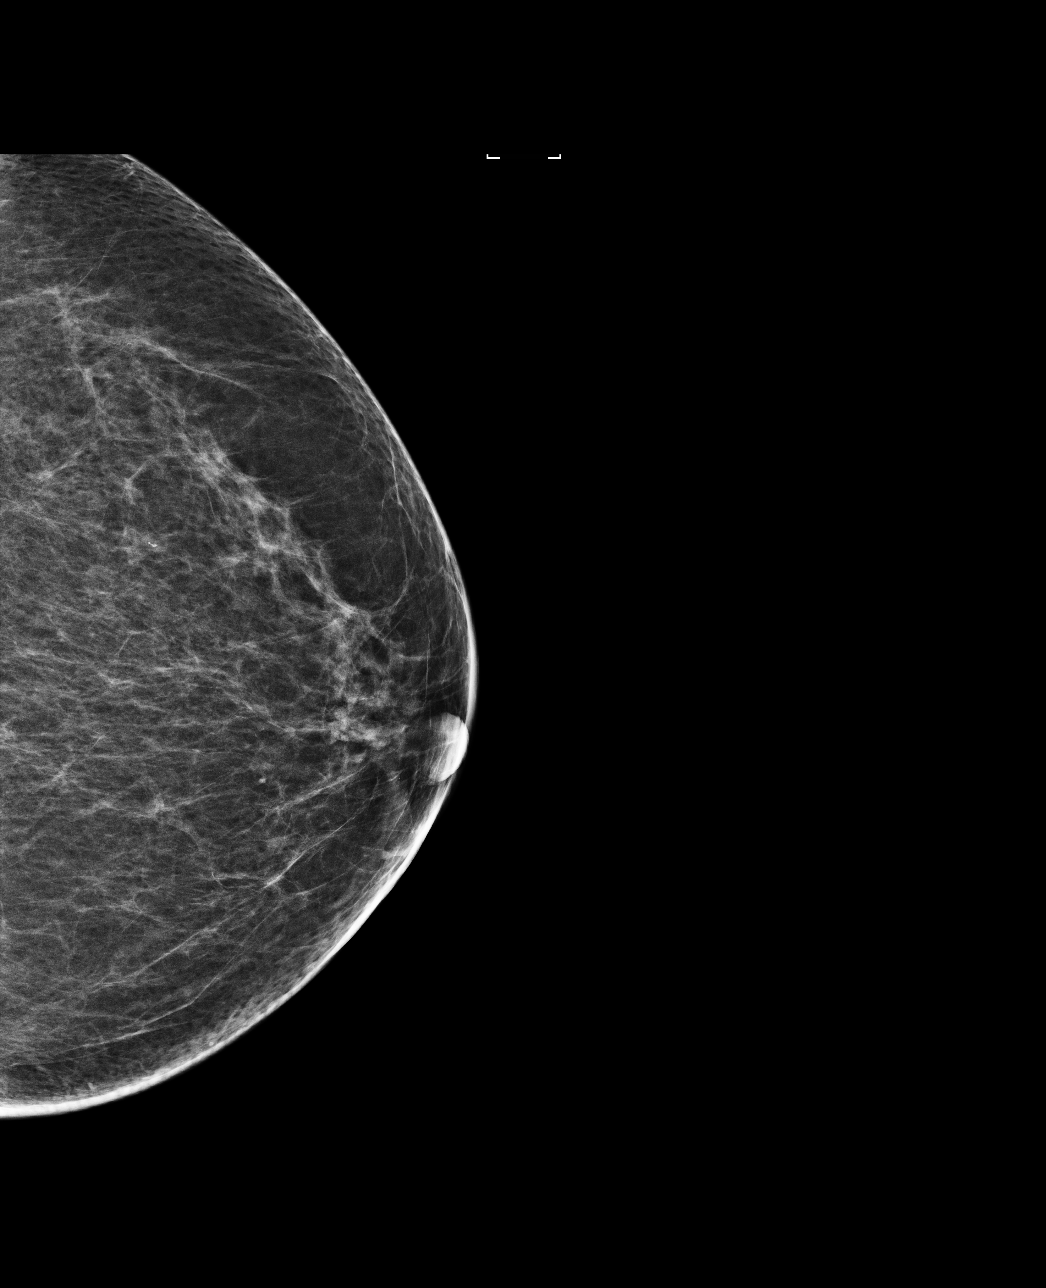

[4 of 4 positions shown; findings below may reference images not displayed]

ACR Breast Density Category b: There are scattered areas of
fibroglandular density.
FINDINGS: There are no findings suspicious for malignancy. Images were
processed with CAD.
IMPRESSION: No mammographic evidence of malignancy. A result letter of this
screening mammogram will be mailed directly to the patient.

RECOMMENDATION:
Screening mammogram in one year. (Code:[US])

BI-RADS CATEGORY  1: Negative.

## 2017-01-15 ENCOUNTER — Ambulatory Visit (INDEPENDENT_AMBULATORY_CARE_PROVIDER_SITE_OTHER): Payer: Medicare Other | Admitting: Family Medicine

## 2017-01-15 ENCOUNTER — Encounter: Payer: Self-pay | Admitting: Family Medicine

## 2017-01-15 DIAGNOSIS — E785 Hyperlipidemia, unspecified: Secondary | ICD-10-CM | POA: Diagnosis not present

## 2017-01-15 DIAGNOSIS — I10 Essential (primary) hypertension: Secondary | ICD-10-CM | POA: Diagnosis not present

## 2017-01-15 LAB — LIPID PANEL
Cholesterol: 178 mg/dL (ref <200)
HDL: 54 mg/dL (ref >50)
LDL Cholesterol: 106 mg/dL — ABNORMAL HIGH (ref <100)
Total CHOL/HDL Ratio: 3.3 Ratio (ref <5.0)
Triglycerides: 91 mg/dL (ref <150)
VLDL: 18 mg/dL (ref <30)

## 2017-01-15 MED ORDER — SIMVASTATIN 40 MG PO TABS: 40.0000 mg | ORAL_TABLET | Freq: Every day | ORAL | 0 refills | Status: DC

## 2017-01-15 MED ORDER — LOSARTAN POTASSIUM 100 MG PO TABS: 100.0000 mg | ORAL_TABLET | Freq: Every day | ORAL | 0 refills | Status: DC

## 2017-01-15 NOTE — Progress Notes (Signed)
Name: LEEONA MCCARDLE   MRN: [id number]    DOB: 04-05-1946   Date:01/15/2017       Progress Note  Subjective  Chief Complaint  Chief Complaint  Patient presents with  . Medication Refill    Hypertension  This is a chronic problem. The problem is unchanged. The problem is controlled. Pertinent negatives include no blurred vision, palpitations or shortness of breath. Past treatments include angiotensin blockers. There is no history of kidney disease, CAD/MI or CVA.  Hyperlipidemia  This is a chronic problem. The problem is controlled. Recent lipid tests were reviewed and are normal. Pertinent negatives include no leg pain or shortness of breath. Current antihyperlipidemic treatment includes statins. Risk factors for coronary artery disease include dyslipidemia.      Past Medical History:  Diagnosis Date  . Allergy   . Hyperglycemia   . Hyperlipidemia   . Hypertension   . Psoriasis     Past Surgical History:  Procedure Laterality Date  . TONSILLECTOMY      Family History  Problem Relation Age of Onset  . Fibromyalgia Mother   . Heart Problems Mother   . Dementia Father   . Heart Problems Father   . Heart disease Father   . Breast cancer Maternal Grandmother     Social History   Social History  . Marital status: Married    Spouse name: N/A  . Number of children: N/A  . Years of education: N/A   Occupational History  . Not on file.   Social History Main Topics  . Smoking status: Never Smoker  . Smokeless tobacco: Never Used  . Alcohol use 0.6 oz/week    1 Glasses of wine per week     Comment: nightly  . Drug use: No  . Sexual activity: Yes    Partners: Male   Other Topics Concern  . Not on file   Social History Narrative  . No narrative on file     Current Outpatient Prescriptions:  .  aspirin 81 MG tablet, Take 1 tablet (81 mg total) by mouth daily., Disp: 30 tablet, Rfl: 12 .  Calcium Carbonate-Vitamin D (CALCIUM-VITAMIN D) 500-200 MG-UNIT per  tablet, Take 1 tablet by mouth daily., Disp: , Rfl:  .  diphenhydrAMINE (BENADRYL) 25 MG tablet, Take 25 mg by mouth every 6 (six) hours as needed (as needed)., Disp: , Rfl:  .  losartan (COZAAR) 100 MG tablet, TAKE ONE TABLET BY MOUTH DAILY, Disp: 90 tablet, Rfl: 0 .  simvastatin (ZOCOR) 40 MG tablet, TAKE ONE TABLET BY MOUTH DAILY, Disp: 90 tablet, Rfl: 0  Allergies  Allergen Reactions  . Crestor [Rosuvastatin Calcium] Other (See Comments)    headaches     Review of Systems  Eyes: Negative for blurred vision.  Respiratory: Negative for shortness of breath.   Cardiovascular: Negative for palpitations.      Objective  Vitals:   01/15/17 0818  BP: 114/78  Pulse: 73  Resp: 16  Temp: 97.8 F (36.6 C)  TempSrc: Oral  SpO2: 96%  Weight: 171 lb 6.4 oz (77.7 kg)  Height: 5\' 5"  (1.651 m)    Physical Exam  Constitutional: She is oriented to person, place, and time and well-developed, well-nourished, and in no distress.  HENT:  Head: Normocephalic and atraumatic.  Cardiovascular: Normal rate, regular rhythm, S1 normal and S2 normal.   No murmur heard. Pulmonary/Chest: Breath sounds normal. She has no wheezes. She has no rhonchi.  Abdominal: Soft. Bowel sounds are normal. There  is no tenderness.  Musculoskeletal: She exhibits no edema.       Right ankle: She exhibits no swelling.       Left ankle: She exhibits no swelling.  Neurological: She is alert and oriented to person, place, and time.  Psychiatric: Mood, memory, affect and judgment normal.  Nursing note and vitals reviewed.    Assessment & Plan  1. Essential hypertension BP stable on present antihypertensive - losartan (COZAAR) 100 MG tablet; Take 1 tablet (100 mg total) by mouth daily.  Dispense: 90 tablet; Refill: 0  2. Hyperlipidemia, unspecified hyperlipidemia type Stable FLP, repeat today - simvastatin (ZOCOR) 40 MG tablet; Take 1 tablet (40 mg total) by mouth daily.  Dispense: 90 tablet; Refill: 0 - Lipid  panel   Anylah Scheib Asad A. [facility] 01/15/2017 8:34 AM

## 2017-05-09 ENCOUNTER — Ambulatory Visit (INDEPENDENT_AMBULATORY_CARE_PROVIDER_SITE_OTHER): Payer: Medicare Other | Admitting: Family Medicine

## 2017-05-09 ENCOUNTER — Encounter: Payer: Self-pay | Admitting: Family Medicine

## 2017-05-09 VITALS — BP 128/64 | HR 86 | Temp 98.0°F | Ht 65.0 in | Wt 167.7 lb

## 2017-05-09 DIAGNOSIS — E785 Hyperlipidemia, unspecified: Secondary | ICD-10-CM | POA: Diagnosis not present

## 2017-05-09 DIAGNOSIS — I10 Essential (primary) hypertension: Secondary | ICD-10-CM | POA: Diagnosis not present

## 2017-05-09 DIAGNOSIS — Z23 Encounter for immunization: Secondary | ICD-10-CM | POA: Diagnosis not present

## 2017-05-09 DIAGNOSIS — H6122 Impacted cerumen, left ear: Secondary | ICD-10-CM | POA: Diagnosis not present

## 2017-05-09 MED ORDER — SIMVASTATIN 40 MG PO TABS: 40.0000 mg | ORAL_TABLET | Freq: Every day | ORAL | 0 refills | Status: DC

## 2017-05-09 MED ORDER — LOSARTAN POTASSIUM 100 MG PO TABS: 100.0000 mg | ORAL_TABLET | Freq: Every day | ORAL | 0 refills | Status: DC

## 2017-05-09 NOTE — Progress Notes (Signed)
Name: Hannah Lindsey   MRN: [id number]    DOB: 08/12/1945   Date:05/09/2017       Progress Note  Subjective  Chief Complaint  Chief Complaint  Patient presents with  . Hyperlipidemia  . Hypertension    Hyperlipidemia  This is a chronic problem. The problem is uncontrolled. Recent lipid tests were reviewed and are high (elevated LDL). Pertinent negatives include no chest pain, leg pain or shortness of breath. Current antihyperlipidemic treatment includes statins. Risk factors for coronary artery disease include dyslipidemia.  Hypertension  This is a chronic problem. The problem is unchanged. The problem is controlled. Pertinent negatives include no blurred vision, chest pain, headaches, neck pain, palpitations or shortness of breath. Past treatments include angiotensin blockers. There is no history of kidney disease, CAD/MI or CVA.  Otalgia   There is pain in the left ear. This is a recurrent problem. The current episode started in the past 7 days (2 days ago). The problem has been waxing and waning. There has been no fever. The pain is at a severity of 1/10. Pertinent negatives include no coughing, ear discharge, headaches, hearing loss, neck pain, rash, sore throat or vomiting. Treatments tried: has used Debrox in the past, but nothing recently.     Past Medical History:  Diagnosis Date  . Allergy   . Hyperglycemia   . Hyperlipidemia   . Hypertension   . Psoriasis     Past Surgical History:  Procedure Laterality Date  . TONSILLECTOMY      Family History  Problem Relation Age of Onset  . Fibromyalgia Mother   . Heart Problems Mother   . Dementia Father   . Heart Problems Father   . Heart disease Father   . Breast cancer Maternal Grandmother     Social History   Social History  . Marital status: Married    Spouse name: N/A  . Number of children: N/A  . Years of education: N/A   Occupational History  . Not on file.   Social History Main Topics  . Smoking  status: Never Smoker  . Smokeless tobacco: Never Used  . Alcohol use 0.6 oz/week    1 Glasses of wine per week     Comment: nightly  . Drug use: No  . Sexual activity: Yes    Partners: Male    Birth control/ protection: Post-menopausal   Other Topics Concern  . Not on file   Social History Narrative  . No narrative on file     Current Outpatient Prescriptions:  .  aspirin 81 MG tablet, Take 1 tablet (81 mg total) by mouth daily., Disp: 30 tablet, Rfl: 12 .  Calcium Carbonate-Vitamin D (CALCIUM-VITAMIN D) 500-200 MG-UNIT per tablet, Take 1 tablet by mouth daily., Disp: , Rfl:  .  diphenhydrAMINE (BENADRYL) 25 MG tablet, Take 25 mg by mouth every 6 (six) hours as needed (as needed)., Disp: , Rfl:  .  losartan (COZAAR) 100 MG tablet, Take 1 tablet (100 mg total) by mouth daily., Disp: 90 tablet, Rfl: 0 .  simvastatin (ZOCOR) 40 MG tablet, Take 1 tablet (40 mg total) by mouth daily., Disp: 90 tablet, Rfl: 0  Allergies  Allergen Reactions  . Crestor [Rosuvastatin Calcium] Other (See Comments)    headaches     Review of Systems  HENT: Positive for ear pain. Negative for ear discharge, hearing loss and sore throat.   Eyes: Negative for blurred vision.  Respiratory: Negative for cough and shortness of breath.  Cardiovascular: Negative for chest pain and palpitations.  Gastrointestinal: Negative for vomiting.  Musculoskeletal: Negative for neck pain.  Skin: Negative for rash.  Neurological: Negative for headaches.      Objective  Vitals:   05/09/17 1107  BP: 128/64  Pulse: 86  Temp: 98 F (36.7 C)  TempSrc: Oral  SpO2: 96%  Weight: 167 lb 11.2 oz (76.1 kg)  Height: 5\' 5"  (1.651 m)    Physical Exam  Constitutional: She is oriented to person, place, and time and well-developed, well-nourished, and in no distress.  HENT:  Head: Normocephalic.  Right Ear: Tympanic membrane and ear canal normal. No drainage or swelling.  Left Ear: No drainage or swelling.  Left ear  canal with cerumen impaction.  Cardiovascular: Normal rate, regular rhythm and normal heart sounds.   No murmur heard. Pulmonary/Chest: Effort normal and breath sounds normal. She has no wheezes.  Neurological: She is alert and oriented to person, place, and time.  Nursing note and vitals reviewed.     Assessment & Plan  1. Flu vaccine need  - Flu vaccine HIGH DOSE PF (Fluzone High dose)  2. Essential hypertension BP stable on present antihypertensive treatment - losartan (COZAAR) 100 MG tablet; Take 1 tablet (100 mg total) by mouth daily.  Dispense: 90 tablet; Refill: 0  3. Hyperlipidemia, unspecified hyperlipidemia type  - simvastatin (ZOCOR) 40 MG tablet; Take 1 tablet (40 mg total) by mouth daily.  Dispense: 90 tablet; Refill: 0 - Lipid panel  4. Excessive cerumen in left ear canal Device to use over-the-counter cerumen removal treatment, if not successful, may need ear lavage in office   Bryn Saline Asad A. [facility] 05/09/2017 11:25 AM

## 2017-08-06 ENCOUNTER — Ambulatory Visit: Payer: Medicare Other

## 2017-08-06 LAB — LIPID PANEL
Cholesterol: 231 mg/dL — ABNORMAL HIGH (ref <200)
HDL: 57 mg/dL (ref >50)
LDL Cholesterol (Calc): 148 mg/dL (calc) — ABNORMAL HIGH
Non-HDL Cholesterol (Calc): 174 mg/dL (calc) — ABNORMAL HIGH (ref <130)
Total CHOL/HDL Ratio: 4.1 (calc) (ref <5.0)
Triglycerides: 135 mg/dL (ref <150)

## 2017-08-10 ENCOUNTER — Ambulatory Visit (INDEPENDENT_AMBULATORY_CARE_PROVIDER_SITE_OTHER): Payer: Medicare Other | Admitting: Family Medicine

## 2017-08-10 ENCOUNTER — Encounter: Payer: Self-pay | Admitting: Family Medicine

## 2017-08-10 VITALS — BP 126/64 | HR 62 | Temp 97.6°F | Resp 14 | Wt 170.8 lb

## 2017-08-10 DIAGNOSIS — E7800 Pure hypercholesterolemia, unspecified: Secondary | ICD-10-CM | POA: Diagnosis not present

## 2017-08-10 DIAGNOSIS — I10 Essential (primary) hypertension: Secondary | ICD-10-CM

## 2017-08-10 MED ORDER — LOSARTAN POTASSIUM 100 MG PO TABS: 100.0000 mg | ORAL_TABLET | Freq: Every day | ORAL | 0 refills | Status: DC

## 2017-08-10 MED ORDER — ROSUVASTATIN CALCIUM 10 MG PO TABS: 10.0000 mg | ORAL_TABLET | Freq: Every day | ORAL | 0 refills | Status: DC

## 2017-08-10 NOTE — Progress Notes (Signed)
Name: Hannah Lindsey   MRN: [id number]    DOB: 1946-05-14   Date:08/10/2017       Progress Note  Subjective  Chief Complaint  Chief Complaint  Patient presents with  . Follow-up    Discuss changing higher intensity statin due to abdnormal lipid panel  . Hyperlipidemia    Pt states she ruin her diet over the holidays but she is trying to get back on a low card diet   . Hypertension    Pt states Bp has been steady. Pt denies any issue.   . Medication Refill    Hyperlipidemia  This is a chronic problem. The problem is uncontrolled. Recent lipid tests were reviewed and are high. Pertinent negatives include no chest pain, leg pain, myalgias or shortness of breath. Current antihyperlipidemic treatment includes statins.  Hypertension  This is a chronic problem. The problem is unchanged. The problem is controlled. Pertinent negatives include no blurred vision, chest pain, headaches, palpitations or shortness of breath. Past treatments include angiotensin blockers. There is no history of kidney disease, CAD/MI or CVA.     Past Medical History:  Diagnosis Date  . Allergy   . Hyperglycemia   . Hyperlipidemia   . Hypertension   . Psoriasis     Past Surgical History:  Procedure Laterality Date  . TONSILLECTOMY      Family History  Problem Relation Age of Onset  . Fibromyalgia Mother   . Heart Problems Mother   . Dementia Father   . Heart Problems Father   . Heart disease Father   . Breast cancer Maternal Grandmother     Social History   Socioeconomic History  . Marital status: Married    Spouse name: Not on file  . Number of children: Not on file  . Years of education: Not on file  . Highest education level: Not on file  Social Needs  . Financial resource strain: Not on file  . Food insecurity - worry: Not on file  . Food insecurity - inability: Not on file  . Transportation needs - medical: Not on file  . Transportation needs - non-medical: Not on file   Occupational History  . Not on file  Tobacco Use  . Smoking status: Never Smoker  . Smokeless tobacco: Never Used  Substance and Sexual Activity  . Alcohol use: Yes    Alcohol/week: 0.6 oz    Types: 1 Glasses of wine per week    Comment: nightly  . Drug use: No  . Sexual activity: Yes    Partners: Male    Birth control/protection: Post-menopausal  Other Topics Concern  . Not on file  Social History Narrative  . Not on file     Current Outpatient Medications:  .  aspirin 81 MG tablet, Take 1 tablet (81 mg total) by mouth daily., Disp: 30 tablet, Rfl: 12 .  Calcium Carbonate-Vitamin D (CALCIUM-VITAMIN D) 500-200 MG-UNIT per tablet, Take 1 tablet by mouth daily., Disp: , Rfl:  .  diphenhydrAMINE (BENADRYL) 25 MG tablet, Take 25 mg by mouth every 6 (six) hours as needed (as needed)., Disp: , Rfl:  .  losartan (COZAAR) 100 MG tablet, Take 1 tablet (100 mg total) by mouth daily., Disp: 90 tablet, Rfl: 0 .  simvastatin (ZOCOR) 40 MG tablet, Take 1 tablet (40 mg total) by mouth daily., Disp: 90 tablet, Rfl: 0  Allergies  Allergen Reactions  . Crestor [Rosuvastatin Calcium] Other (See Comments)    headaches  Review of Systems  Eyes: Negative for blurred vision.  Respiratory: Negative for shortness of breath.   Cardiovascular: Negative for chest pain and palpitations.  Musculoskeletal: Negative for myalgias.  Neurological: Negative for headaches.    Objective  Vitals:   08/10/17 0822 08/10/17 0827  BP: 140/68 126/64  Pulse: 62   Resp: 14   Temp: 97.6 F (36.4 C)   TempSrc: Oral   SpO2: 98%   Weight: 170 lb 12.8 oz (77.5 kg)     Physical Exam  Constitutional: She is oriented to person, place, and time and well-developed, well-nourished, and in no distress.  HENT:  Head: Normocephalic and atraumatic.  Cardiovascular: Normal rate, regular rhythm and normal heart sounds.  No murmur heard. Pulmonary/Chest: Effort normal and breath sounds normal. She has no  wheezes.  Abdominal: Soft. Bowel sounds are normal.  Neurological: She is alert and oriented to person, place, and time.  Psychiatric: Mood, memory, affect and judgment normal.  Nursing note and vitals reviewed.     Assessment & Plan  1. Essential hypertension BP stable on present antihypertensive treatment - losartan (COZAAR) 100 MG tablet; Take 1 tablet (100 mg total) by mouth daily.  Dispense: 90 tablet; Refill: 0  2. Pure hypercholesterolemia Will change to Crestor for abnormal lipid profile, advised on dietary and lifestyle changes, recheck in 3 months - rosuvastatin (CRESTOR) 10 MG tablet; Take 1 tablet (10 mg total) by mouth daily.  Dispense: 90 tablet; Refill: 0   Taishaun Levels [facility] 08/10/2017 8:37 AM

## 2017-09-06 ENCOUNTER — Telehealth: Payer: Self-pay

## 2017-09-06 NOTE — Telephone Encounter (Signed)
Received a letter from her insurance regarding her losartan may be on recall. Pt states that she spoke with her pharmacist and its not the brand of losartan she is taking that has been recalled.

## 2017-09-21 DIAGNOSIS — H6123 Impacted cerumen, bilateral: Secondary | ICD-10-CM | POA: Diagnosis not present

## 2017-09-21 DIAGNOSIS — H10023 Other mucopurulent conjunctivitis, bilateral: Secondary | ICD-10-CM | POA: Diagnosis not present

## 2017-09-21 DIAGNOSIS — J0100 Acute maxillary sinusitis, unspecified: Secondary | ICD-10-CM | POA: Diagnosis not present

## 2017-09-24 ENCOUNTER — Ambulatory Visit: Payer: Medicare Other | Admitting: Family Medicine

## 2017-11-09 ENCOUNTER — Ambulatory Visit (INDEPENDENT_AMBULATORY_CARE_PROVIDER_SITE_OTHER): Payer: Medicare Other | Admitting: Family Medicine

## 2017-11-09 ENCOUNTER — Encounter: Payer: Self-pay | Admitting: Family Medicine

## 2017-11-09 ENCOUNTER — Ambulatory Visit (INDEPENDENT_AMBULATORY_CARE_PROVIDER_SITE_OTHER): Payer: Medicare Other

## 2017-11-09 VITALS — BP 128/68 | HR 84 | Temp 98.4°F | Ht 65.0 in | Wt 170.0 lb

## 2017-11-09 VITALS — BP 128/68 | HR 84 | Temp 98.4°F | Resp 16 | Ht 65.0 in | Wt 170.9 lb

## 2017-11-09 DIAGNOSIS — R252 Cramp and spasm: Secondary | ICD-10-CM

## 2017-11-09 DIAGNOSIS — I10 Essential (primary) hypertension: Secondary | ICD-10-CM

## 2017-11-09 DIAGNOSIS — E7800 Pure hypercholesterolemia, unspecified: Secondary | ICD-10-CM

## 2017-11-09 DIAGNOSIS — Z0000 Encounter for general adult medical examination without abnormal findings: Secondary | ICD-10-CM | POA: Diagnosis not present

## 2017-11-09 DIAGNOSIS — J302 Other seasonal allergic rhinitis: Secondary | ICD-10-CM | POA: Insufficient documentation

## 2017-11-09 DIAGNOSIS — J3089 Other allergic rhinitis: Secondary | ICD-10-CM | POA: Diagnosis not present

## 2017-11-09 DIAGNOSIS — G4709 Other insomnia: Secondary | ICD-10-CM | POA: Diagnosis not present

## 2017-11-09 DIAGNOSIS — M810 Age-related osteoporosis without current pathological fracture: Secondary | ICD-10-CM | POA: Diagnosis not present

## 2017-11-09 DIAGNOSIS — R739 Hyperglycemia, unspecified: Secondary | ICD-10-CM | POA: Diagnosis not present

## 2017-11-09 DIAGNOSIS — Z780 Asymptomatic menopausal state: Secondary | ICD-10-CM

## 2017-11-09 MED ORDER — LORATADINE 10 MG PO TABS: 10.0000 mg | ORAL_TABLET | Freq: Every day | ORAL | 1 refills | Status: DC

## 2017-11-09 MED ORDER — LOSARTAN POTASSIUM 100 MG PO TABS: 100.0000 mg | ORAL_TABLET | Freq: Every day | ORAL | 1 refills | Status: DC

## 2017-11-09 MED ORDER — FLUTICASONE PROPIONATE 50 MCG/ACT NA SUSP: 2.0000 | Freq: Every day | NASAL | 6 refills | Status: DC

## 2017-11-09 NOTE — Progress Notes (Addendum)
Subjective:   Hannah Lindsey is a 72 y.o. female who presents for Medicare Annual (Subsequent) preventive examination.  Review of Systems:  N/A Cardiac Risk Factors include: advanced age (>12men, >16 women);dyslipidemia;hypertension     Objective:     Vitals: BP 128/68   Pulse 84   Temp 98.4 F (36.9 C) (Oral)   Ht 5\' 5"  (1.651 m)   Wt 170 lb (77.1 kg)   BMI 28.29 kg/m   Body mass index is 28.29 kg/m.  Advanced Directives 11/09/2017 01/15/2017 09/06/2016 06/19/2016 05/29/2016 04/19/2015  Does Patient Have a Medical Advance Directive? No No No No No No  Would patient like information on creating a medical advance directive? Yes (MAU/Ambulatory/Procedural Areas - Information given) - - No - patient declined information No - patient declined information -    Tobacco Social History   Tobacco Use  Smoking Status Never Smoker  Smokeless Tobacco Never Used     Counseling given: No   Clinical Intake:  Pre-visit preparation completed: Yes  Pain : No/denies pain   BMI - recorded: 28.29 Nutritional Status: BMI 25 -29 Overweight Nutritional Risks: None Diabetes: No  How often do you need to have someone help you when you read instructions, pamphlets, or other written materials from your doctor or pharmacy?: 1 - Never  Interpreter Needed?: No  Information entered by :: AEversole, LPN  Hospitalizations/ED visits and surgeries occurring within the previous 12 months:  Within the previous 12 months, pt has not underwent any surgical procedures, has not been hospitalized for any conditions and has not been treated by an emergency room clinician.  Past Medical History:  Diagnosis Date  . Allergy   . Hyperglycemia   . Hyperlipidemia   . Hypertension    Past Surgical History:  Procedure Laterality Date  . TONSILLECTOMY     Family History  Problem Relation Age of Onset  . Fibromyalgia Mother   . Heart Problems Mother   . Dementia Father   . Heart Problems Father    . Heart disease Father   . Breast cancer Maternal Grandmother    Social History   Socioeconomic History  . Marital status: Married    Spouse name: Hannah Lindsey  . Number of children: 1  . Years of education: Not on file  . Highest education level: Bachelor's degree (e.g., BA, AB, BS)  Occupational History    Employer: [employer]  Social Needs  . Financial resource strain: Not hard at all  . Food insecurity:    Worry: Never true    Inability: Never true  . Transportation needs:    Medical: No    Non-medical: No  Tobacco Use  . Smoking status: Never Smoker  . Smokeless tobacco: Never Used  Substance and Sexual Activity  . Alcohol use: Yes    Alcohol/week: 0.6 oz    Types: 1 Glasses of wine per week    Comment: nightly  . Drug use: No  . Sexual activity: Not Currently    Partners: Male    Birth control/protection: Post-menopausal  Lifestyle  . Physical activity:    Days per week: 4 days    Minutes per session: 40 min  . Stress: Not at all  Relationships  . Social connections:    Talks on phone: Patient refused    Gets together: Patient refused    Attends religious service: Patient refused    Active member of club or organization: Patient refused    Attends meetings of clubs or organizations:  Patient refused    Relationship status: Married  Other Topics Concern  . Not on file  Social History Narrative  . Not on file    Outpatient Encounter Medications as of 11/09/2017  Medication Sig  . aspirin 81 MG tablet Take 1 tablet (81 mg total) by mouth daily.  . Calcium Carbonate-Vitamin D (CALCIUM-VITAMIN D) 500-200 MG-UNIT per tablet Take 1 tablet by mouth daily.  . fluticasone (FLONASE) 50 MCG/ACT nasal spray Place 2 sprays into both nostrils daily.  Marland Kitchen loratadine (CLARITIN) 10 MG tablet Take 1 tablet (10 mg total) by mouth daily.  Marland Kitchen losartan (COZAAR) 100 MG tablet Take 1 tablet (100 mg total) by mouth daily.  . rosuvastatin (CRESTOR) 10 MG tablet Take 1 tablet (10 mg  total) by mouth daily.   No facility-administered encounter medications on file as of 11/09/2017.     Activities of Daily Living In your present state of health, do you have any difficulty performing the following activities: 11/09/2017 11/09/2017  Hearing? N N  Comment denies hearing aids -  Vision? N N  Comment wears glasses -  Difficulty concentrating or making decisions? N N  Walking or climbing stairs? N N  Dressing or bathing? N N  Doing errands, shopping? N N  Preparing Food and eating ? N -  Comment denies dentures -  Using the Toilet? N -  In the past six months, have you accidently leaked urine? N -  Do you have problems with loss of bowel control? N -  Managing your Medications? N -  Managing your Finances? N -  Housekeeping or managing your Housekeeping? N -  Some recent data might be hidden    Patient Care Team: Steele Sizer, MD as PCP - General (Family Medicine)    Assessment:   This is a routine wellness examination for Hannah Lindsey.  Exercise Activities and Dietary recommendations Current Exercise Habits: The patient has a physically strenous job, but has no regular exercise apart from work., Type of exercise: walking, Time (Minutes): 40, Frequency (Times/Week): 4, Weekly Exercise (Minutes/Week): 160, Intensity: Mild, Exercise limited by: None identified  Goals    . DIET - INCREASE WATER INTAKE     Recommend to drink at least 6-8 8oz glasses of water per day.       Fall Risk Fall Risk  11/09/2017 11/09/2017 08/10/2017 01/15/2017 06/19/2016  Falls in the past year? No No No No No  Risk for fall due to : Impaired vision - - - -  Risk for fall due to: Comment wears eyeglasses - - - -   Is the home free of loose throw rugs in walkways, pet beds, electrical cords, etc? Yes Adequate lighting to reduce risk of falls?  Yes In addition, does the patient have any of the following: Stairs in or around the home WITH handrails? Yes Grab bars in the bathroom? No  Shower chair  or a place to sit while bathing? No Use of an elevated toilet seat or a handicapped toilet? No Use of a cane, walker or w/c? No  Timed Get Up and Go Performed: Yes. Pt ambulated 10 feet within 5 sec. Gait stead-fast and without the use of an assistive device No intervention required at this time. Fall risk prevention has been discussed.  Community Resource Referral not required at this time.  Depression Screen PHQ 2/9 Scores 11/09/2017 11/09/2017 11/09/2017 08/10/2017  PHQ - 2 Score 0 0 0 0  PHQ- 9 Score 0 1 - -  Cognitive Function     6CIT Screen 11/09/2017 05/29/2016  What Year? 0 points 0 points  What month? 0 points 0 points  What time? 0 points 0 points  Count back from 20 0 points 0 points  Months in reverse 0 points 0 points  Repeat phrase 2 points 2 points  Total Score 2 2    Immunization History  Administered Date(s) Administered  . Influenza, High Dose Seasonal PF 04/19/2015, 05/29/2016, 05/09/2017  . Influenza-Unspecified 04/07/2014  . Pneumococcal Conjugate-13 10/08/2013  . Pneumococcal Polysaccharide-23 02/29/2012  . Tdap 02/29/2012  . Zoster 04/14/2013    Qualifies for Shingles Vaccine? Yes. Zostavax completed 04/14/13. Due for Shingrix vaccine. Education has been provided regarding the importance of this vaccine. Pt has been advised to call her insurance company to determine her out of pocket expense. Advised she may also receive this vaccine at her local pharmacy or Health Dept. Verbalized acceptance and understanding.  Screening Tests Health Maintenance  Topic Date Due  . MAMMOGRAM  12/06/2017  . INFLUENZA VACCINE  03/07/2018  . COLONOSCOPY  03/20/2018  . TETANUS/TDAP  02/28/2022  . DEXA SCAN  Completed  . Hepatitis C Screening  Completed  . PNA vac Low Risk Adult  Completed    Cancer Screenings: Lung: Low Dose CT Chest recommended if Age 65-80 years, 30 pack-year currently smoking OR have quit w/in 15years. Patient does not qualify. Breast:  Up to date on  Mammogram? Yes. Completed 12/06/16. Repeat every year   Up to date of Bone Density/Dexa? Yes. Completed 01/01/17. No longer required Colorectal: Completed 03/20/13. Repeat every 5 years.  Additional Screenings: Hepatitis C Screening: Completed 05/29/16    Plan:  I have personally reviewed and addressed the Medicare Annual Wellness questionnaire and have noted the following in the patient's chart:  A. Medical and social history B. Use of alcohol, tobacco or illicit drugs  C. Current medications and supplements D. Functional ability and status E.  Nutritional status F.  Physical activity G. Advance directives H. List of other physicians I.  Hospitalizations, surgeries, and ER visits in previous 12 months J.  [place] such as hearing and vision if needed, cognitive and depression L. Referrals and appointments  In addition, I have reviewed and discussed with patient certain preventive protocols, quality metrics, and best practice recommendations. A written personalized care plan for preventive services as well as general preventive health recommendations were provided to patient.  See attached scanned questionnaire for additional information.   Signed,  Aleatha Borer, LPN Nurse Health Advisor  I have reviewed this encounter including the documentation in this note and/or discussed this patient with the provider, Aleatha Borer, LPN. I am certifying that I agree with the content of this note as supervising physician.  Steele Sizer, MD [facility] 11/09/2017, 3:20 PM

## 2017-11-09 NOTE — Patient Instructions (Signed)

## 2017-11-09 NOTE — Progress Notes (Signed)
Name: Hannah Lindsey   MRN: [id number]    DOB: [birth year]   Date:11/09/2017       Progress Note  Subjective  Chief Complaint  Chief Complaint  Patient presents with  . Follow-up    3 month F/U  . Pure hypercholesterolemia    Doing well with Rosuvastatin-not having same symptoms as Crestor.  . Hypertension    Denies any symptoms    HPI  HTN: taking medication, no recent labs. We will recheck labs today. No chest pain or palpitation. Denies SOB or decrease in exercise tolerance.   Hyperlipidemia: she states used to have headache with Crestor, she was switched simvastatin but LDL went up, she is back on Crestor 10 mg since January 2019 and no side effects. We will recheck labs today  Osteopenia: low FRAX score, on high calcium diet, taking supplements and never had a fracture  Perennial allergic rhinitis: she has been taking Benadryl, but advised her to stop that and try loratadine daily and add nasal spray   Patient Active Problem List   Diagnosis Date Noted  . Perennial allergic rhinitis with seasonal variation 11/09/2017  . Osteopenia after menopause 11/09/2017  . Post-menopausal 09/06/2016  . Hyperlipidemia 06/19/2016  . Essential hypertension 06/04/2015    Past Surgical History:  Procedure Laterality Date  . TONSILLECTOMY      Family History  Problem Relation Age of Onset  . Fibromyalgia Mother   . Heart Problems Mother   . Dementia Father   . Heart Problems Father   . Heart disease Father   . Breast cancer Maternal Grandmother     Social History   Socioeconomic History  . Marital status: Married    Spouse name: Jenny Reichmann  . Number of children: 1  . Years of education: Not on file  . Highest education level: Bachelor's degree (e.g., BA, AB, BS)  Occupational History    Employer: [employer]  Social Needs  . Financial resource strain: Not hard at all  . Food insecurity:    Worry: Never true    Inability: Never true  . Transportation needs:     Medical: No    Non-medical: No  Tobacco Use  . Smoking status: Never Smoker  . Smokeless tobacco: Never Used  Substance and Sexual Activity  . Alcohol use: Yes    Alcohol/week: 0.6 oz    Types: 1 Glasses of wine per week    Comment: nightly  . Drug use: No  . Sexual activity: Not Currently    Partners: Male    Birth control/protection: Post-menopausal  Lifestyle  . Physical activity:    Days per week: 4 days    Minutes per session: 40 min  . Stress: Not at all  Relationships  . Social connections:    Talks on phone: Patient refused    Gets together: Patient refused    Attends religious service: Patient refused    Active member of club or organization: Patient refused    Attends meetings of clubs or organizations: Patient refused    Relationship status: Married  . Intimate partner violence:    Fear of current or ex partner: No    Emotionally abused: No    Physically abused: No    Forced sexual activity: No  Other Topics Concern  . Not on file  Social History Narrative  . Not on file     Current Outpatient Medications:  .  aspirin 81 MG tablet, Take 1 tablet (81 mg total) by mouth daily.,  Disp: 30 tablet, Rfl: 12 .  Calcium Carbonate-Vitamin D (CALCIUM-VITAMIN D) 500-200 MG-UNIT per tablet, Take 1 tablet by mouth daily., Disp: , Rfl:  .  losartan (COZAAR) 100 MG tablet, Take 1 tablet (100 mg total) by mouth daily., Disp: 90 tablet, Rfl: 1 .  rosuvastatin (CRESTOR) 10 MG tablet, Take 1 tablet (10 mg total) by mouth daily., Disp: 90 tablet, Rfl: 0 .  fluticasone (FLONASE) 50 MCG/ACT nasal spray, Place 2 sprays into both nostrils daily., Disp: 16 g, Rfl: 6 .  loratadine (CLARITIN) 10 MG tablet, Take 1 tablet (10 mg total) by mouth daily., Disp: 90 tablet, Rfl: 1  No Known Allergies   ROS  Constitutional: Negative for fever or weight change.  Respiratory: Negative for cough and shortness of breath.   Cardiovascular: Negative for chest pain or palpitations.   Gastrointestinal: Negative for abdominal pain, no bowel changes.  Musculoskeletal: Negative for gait problem or joint swelling.  Skin: Negative for rash.  Neurological: Negative for dizziness or headache.  No other specific complaints in a complete review of systems (except as listed in HPI above).   Objective  Vitals:   11/09/17 0915  BP: 128/68  Pulse: 84  Resp: 16  Temp: 98.4 F (36.9 C)  TempSrc: Oral  SpO2: 95%  Weight: 170 lb 14.4 oz (77.5 kg)  Height: 5\' 5"  (1.651 m)    Body mass index is 28.44 kg/m.  Physical Exam  Constitutional: Patient appears well-developed and well-nourished. Overweight.  No distress.  HEENT: head atraumatic, normocephalic, pupils equal and reactive to light, neck supple, throat within normal limits Cardiovascular: Normal rate, regular rhythm and normal heart sounds.  No murmur heard. No BLE edema. Pulmonary/Chest: Effort normal and breath sounds normal. No respiratory distress. Abdominal: Soft.  There is no tenderness. Psychiatric: Patient has a normal mood and affect. behavior is normal. Judgment and thought content normal.   PHQ2/9: Depression screen [facility] 2/9 11/09/2017 11/09/2017 11/09/2017 08/10/2017 01/15/2017  Decreased Interest 0 0 0 0 0  Down, Depressed, Hopeless 0 0 0 0 0  PHQ - 2 Score 0 0 0 0 0  Altered sleeping 0 1 - - -  Tired, decreased energy 0 0 - - -  Change in appetite 0 0 - - -  Feeling bad or failure about yourself  0 0 - - -  Trouble concentrating 0 0 - - -  Moving slowly or fidgety/restless 0 0 - - -  Suicidal thoughts 0 0 - - -  PHQ-9 Score 0 1 - - -  Difficult doing work/chores Not difficult at all Not difficult at all - - -     Fall Risk: Fall Risk  11/09/2017 11/09/2017 08/10/2017 01/15/2017 06/19/2016  Falls in the past year? No No No No No  Risk for fall due to : Impaired vision - - - -  Risk for fall due to: Comment wears eyeglasses - - - -     Functional Status Survey: Is the patient deaf or have difficulty  hearing?: No Does the patient have difficulty seeing, even when wearing glasses/contacts?: No Does the patient have difficulty concentrating, remembering, or making decisions?: No Does the patient have difficulty walking or climbing stairs?: No Does the patient have difficulty dressing or bathing?: No Does the patient have difficulty doing errands alone such as visiting a doctor's office or shopping?: No    Assessment & Plan  1. Perennial allergic rhinitis with seasonal variation  Stop benadryl  - loratadine (CLARITIN) 10 MG tablet;  Take 1 tablet (10 mg total) by mouth daily.  Dispense: 90 tablet; Refill: 1  2. Osteopenia after menopause  FRAX no high enough   3. Essential hypertension  - COMPLETE METABOLIC PANEL WITH GFR - CBC with Differential/Platelet - losartan (COZAAR) 100 MG tablet; Take 1 tablet (100 mg total) by mouth daily.  Dispense: 90 tablet; Refill: 1  4. Pure hypercholesterolemia  - Lipid panel  5. Leg cramps  - Magnesium  6. Hyperglycemia  - Hemoglobin A1c  7. Other insomnia  Discussed sleep hygiene.

## 2017-11-09 NOTE — Patient Instructions (Signed)
Hannah Lindsey , Thank you for taking time to come for your Medicare Wellness Visit. I appreciate your ongoing commitment to your health goals. Please review the following plan we discussed and let me know if I can assist you in the future.   Screening recommendations/referrals: Colorectal Screening: Completed 03/20/13. Repeat every 5 years Mammogram: Completed 12/06/16. Repeat every year Bone Density: Completed 01/01/17. No longer required Lung Cancer Screening: You do not qualify for this screening Hepatitis C Screening: Completed 05/29/16  Vision and Dental Exams: Recommended annual ophthalmology exams for early detection of glaucoma and other disorders of the eye Recommended annual dental exams for proper oral hygiene  Vaccinations: Influenza vaccine: Up to date Pneumococcal vaccine: Completed series Tdap vaccine: Up to date Shingles vaccine: Please call your insurance company to determine your out of pocket expense for the Shingrix vaccine. You may also receive this vaccine at your local pharmacy or Health Dept.  Advanced directives: Advance directive discussed with you today. I have provided a copy for you to complete at home and have notarized. Once this is complete please bring a copy in to our office so we can scan it into your chart.  Conditions/risks identified: Recommend to drink at least 6-8 8oz glasses of water per day.  Next appointment: Please schedule your Annual Wellness Visit with your Nurse Health Advisor in one year.  Preventive Care 7 Years and Older, Female Preventive care refers to lifestyle choices and visits with your health care provider that can promote health and wellness. What does preventive care include?  A yearly physical exam. This is also called an annual well check.  Dental exams once or twice a year.  Routine eye exams. Ask your health care provider how often you should have your eyes checked.  Personal lifestyle choices, including:  Daily care of  your teeth and gums.  Regular physical activity.  Eating a healthy diet.  Avoiding tobacco and drug use.  Limiting alcohol use.  Practicing safe sex.  Taking low-dose aspirin every day.  Taking vitamin and mineral supplements as recommended by your health care provider. What happens during an annual well check? The services and screenings done by your health care provider during your annual well check will depend on your age, overall health, lifestyle risk factors, and family history of disease. Counseling  Your health care provider may ask you questions about your:  Alcohol use.  Tobacco use.  Drug use.  Emotional well-being.  Home and relationship well-being.  Sexual activity.  Eating habits.  History of falls.  Memory and ability to understand (cognition).  Work and work Statistician.  Reproductive health. Screening  You may have the following tests or measurements:  Height, weight, and BMI.  Blood pressure.  Lipid and cholesterol levels. These may be checked every 5 years, or more frequently if you are over 13 years old.  Skin check.  Lung cancer screening. You may have this screening every year starting at age 38 if you have a 30-pack-year history of smoking and currently smoke or have quit within the past 15 years.  Fecal occult blood test (FOBT) of the stool. You may have this test every year starting at age 42.  Flexible sigmoidoscopy or colonoscopy. You may have a sigmoidoscopy every 5 years or a colonoscopy every 10 years starting at age 59.  Hepatitis C blood test.  Hepatitis B blood test.  Sexually transmitted disease (STD) testing.  Diabetes screening. This is done by checking your blood sugar (glucose) after you  have not eaten for a while (fasting). You may have this done every 1-3 years.  Bone density scan. This is done to screen for osteoporosis. You may have this done starting at age 33.  Mammogram. This may be done every 1-2 years.  Talk to your health care provider about how often you should have regular mammograms. Talk with your health care provider about your test results, treatment options, and if necessary, the need for more tests. Vaccines  Your health care provider may recommend certain vaccines, such as:  Influenza vaccine. This is recommended every year.  Tetanus, diphtheria, and acellular pertussis (Tdap, Td) vaccine. You may need a Td booster every 10 years.  Zoster vaccine. You may need this after age 80.  Pneumococcal 13-valent conjugate (PCV13) vaccine. One dose is recommended after age 76.  Pneumococcal polysaccharide (PPSV23) vaccine. One dose is recommended after age 56. Talk to your health care provider about which screenings and vaccines you need and how often you need them. This information is not intended to replace advice given to you by your health care provider. Make sure you discuss any questions you have with your health care provider. Document Released: 08/20/2015 Document Revised: 04/12/2016 Document Reviewed: 05/25/2015 Elsevier Interactive Patient Education  2017 [employer] Prevention in the Home Falls can cause injuries. They can happen to people of all ages. There are many things you can do to make your home safe and to help prevent falls. What can I do on the outside of my home?  Regularly fix the edges of walkways and driveways and fix any cracks.  Remove anything that might make you trip as you walk through a door, such as a raised step or threshold.  Trim any bushes or trees on the path to your home.  Use bright outdoor lighting.  Clear any walking paths of anything that might make someone trip, such as rocks or tools.  Regularly check to see if handrails are loose or broken. Make sure that both sides of any steps have handrails.  Any raised decks and porches should have guardrails on the edges.  Have any leaves, snow, or ice cleared regularly.  Use sand or  salt on walking paths during winter.  Clean up any spills in your garage right away. This includes oil or grease spills. What can I do in the bathroom?  Use night lights.  Install grab bars by the toilet and in the tub and shower. Do not use towel bars as grab bars.  Use non-skid mats or decals in the tub or shower.  If you need to sit down in the shower, use a plastic, non-slip stool.  Keep the floor dry. Clean up any water that spills on the floor as soon as it happens.  Remove soap buildup in the tub or shower regularly.  Attach bath mats securely with double-sided non-slip rug tape.  Do not have throw rugs and other things on the floor that can make you trip. What can I do in the bedroom?  Use night lights.  Make sure that you have a light by your bed that is easy to reach.  Do not use any sheets or blankets that are too big for your bed. They should not hang down onto the floor.  Have a firm chair that has side arms. You can use this for support while you get dressed.  Do not have throw rugs and other things on the floor that can make you trip. What  can I do in the kitchen?  Clean up any spills right away.  Avoid walking on wet floors.  Keep items that you use a lot in easy-to-reach places.  If you need to reach something above you, use a strong step stool that has a grab bar.  Keep electrical cords out of the way.  Do not use floor polish or wax that makes floors slippery. If you must use wax, use non-skid floor wax.  Do not have throw rugs and other things on the floor that can make you trip. What can I do with my stairs?  Do not leave any items on the stairs.  Make sure that there are handrails on both sides of the stairs and use them. Fix handrails that are broken or loose. Make sure that handrails are as long as the stairways.  Check any carpeting to make sure that it is firmly attached to the stairs. Fix any carpet that is loose or worn.  Avoid having  throw rugs at the top or bottom of the stairs. If you do have throw rugs, attach them to the floor with carpet tape.  Make sure that you have a light switch at the top of the stairs and the bottom of the stairs. If you do not have them, ask someone to add them for you. What else can I do to help prevent falls?  Wear shoes that:  Do not have high heels.  Have rubber bottoms.  Are comfortable and fit you well.  Are closed at the toe. Do not wear sandals.  If you use a stepladder:  Make sure that it is fully opened. Do not climb a closed stepladder.  Make sure that both sides of the stepladder are locked into place.  Ask someone to hold it for you, if possible.  Clearly mark and make sure that you can see:  Any grab bars or handrails.  First and last steps.  Where the edge of each step is.  Use tools that help you move around (mobility aids) if they are needed. These include:  Canes.  Walkers.  Scooters.  Crutches.  Turn on the lights when you go into a dark area. Replace any light bulbs as soon as they burn out.  Set up your furniture so you have a clear path. Avoid moving your furniture around.  If any of your floors are uneven, fix them.  If there are any pets around you, be aware of where they are.  Review your medicines with your doctor. Some medicines can make you feel dizzy. This can increase your chance of falling. Ask your doctor what other things that you can do to help prevent falls. This information is not intended to replace advice given to you by your health care provider. Make sure you discuss any questions you have with your health care provider. Document Released: 05/20/2009 Document Revised: 12/30/2015 Document Reviewed: 08/28/2014 Elsevier Interactive Patient Education  2017 [employer].  Not yet established with an ophthalmologist? A list of physicians has been provided below. Please contact one of the providers below to establish care.    [facility] Address: [address] [place], [place] [address]   Address: [address], [place],  [address]  Phone: [phone]      Phone: [phone]  Website: [email or web address]    Website: [email or web address]  [facility]  Address: [address] [place], [place], [place] [address]   Address: [place], [place], [place] [address] Phone: [phone]      Phone: [phone]    [facility] Address: [place], [place], [place] [address]  Phone: [phone]

## 2017-11-10 LAB — MAGNESIUM: Magnesium: 2.1 mg/dL (ref 1.5–2.5)

## 2017-11-10 LAB — CBC WITH DIFFERENTIAL/PLATELET
Basophils Absolute: 32 cells/uL (ref 0–200)
Basophils Relative: 0.6 %
Eosinophils Absolute: 42 cells/uL (ref 15–500)
Eosinophils Relative: 0.8 %
HCT: 39.9 % (ref 35.0–45.0)
Hemoglobin: 13.9 g/dL (ref 11.7–15.5)
Lymphs Abs: 1601 cells/uL (ref 850–3900)
MCH: 29.1 pg (ref 27.0–33.0)
MCHC: 34.8 g/dL (ref 32.0–36.0)
MCV: 83.6 fL (ref 80.0–100.0)
MPV: 9 fL (ref 7.5–12.5)
Monocytes Relative: 6.8 %
Neutro Abs: 3265 cells/uL (ref 1500–7800)
Neutrophils Relative %: 61.6 %
Platelets: 157 10*3/uL (ref 140–400)
RBC: 4.77 10*6/uL (ref 3.80–5.10)
RDW: 12.4 % (ref 11.0–15.0)
Total Lymphocyte: 30.2 %
WBC mixed population: 360 cells/uL (ref 200–950)
WBC: 5.3 10*3/uL (ref 3.8–10.8)

## 2017-11-10 LAB — LIPID PANEL
Cholesterol: 177 mg/dL (ref <200)
HDL: 49 mg/dL — ABNORMAL LOW (ref >50)
LDL Cholesterol (Calc): 104 mg/dL (calc) — ABNORMAL HIGH
Non-HDL Cholesterol (Calc): 128 mg/dL (calc) (ref <130)
Total CHOL/HDL Ratio: 3.6 (calc) (ref <5.0)
Triglycerides: 144 mg/dL (ref <150)

## 2017-11-10 LAB — COMPLETE METABOLIC PANEL WITH GFR
AG Ratio: 1.9 (calc) (ref 1.0–2.5)
ALT: 18 U/L (ref 6–29)
AST: 18 U/L (ref 10–35)
Albumin: 4.5 g/dL (ref 3.6–5.1)
Alkaline phosphatase (APISO): 74 U/L (ref 33–130)
BUN: 18 mg/dL (ref 7–25)
CO2: 31 mmol/L (ref 20–32)
Calcium: 9.7 mg/dL (ref 8.6–10.4)
Chloride: 104 mmol/L (ref 98–110)
Creat: 0.65 mg/dL (ref 0.60–0.93)
GFR, Est African American: 104 mL/min/{1.73_m2} (ref >=60)
GFR, Est Non African American: 89 mL/min/{1.73_m2} (ref >=60)
Globulin: 2.4 g/dL (calc) (ref 1.9–3.7)
Glucose, Bld: 93 mg/dL (ref 65–99)
Potassium: 4.2 mmol/L (ref 3.5–5.3)
Sodium: 139 mmol/L (ref 135–146)
Total Bilirubin: 0.4 mg/dL (ref 0.2–1.2)
Total Protein: 6.9 g/dL (ref 6.1–8.1)

## 2017-11-10 LAB — HEMOGLOBIN A1C
Hgb A1c MFr Bld: 5.1 % of total Hgb (ref <5.7)
Mean Plasma Glucose: 100 (calc)
eAG (mmol/L): 5.5 (calc)

## 2017-12-03 ENCOUNTER — Other Ambulatory Visit: Payer: Self-pay

## 2017-12-03 DIAGNOSIS — E7800 Pure hypercholesterolemia, unspecified: Secondary | ICD-10-CM

## 2017-12-03 MED ORDER — ROSUVASTATIN CALCIUM 10 MG PO TABS: 10.0000 mg | ORAL_TABLET | Freq: Every day | ORAL | 1 refills | Status: DC

## 2017-12-03 NOTE — Telephone Encounter (Signed)
Refill Request for Cholesterol medication. Crestor 10 mg  Last physical: 11/09/2017  Lab Results  Component Value Date   CHOL 177 11/09/2017   HDL 49 (L) 11/09/2017   LDLCALC 104 (H) 11/09/2017   TRIG 144 11/09/2017   CHOLHDL 3.6 11/09/2017    Follow up visit: 05/10/2018

## 2017-12-17 ENCOUNTER — Telehealth: Payer: Self-pay

## 2017-12-17 NOTE — Telephone Encounter (Signed)
Called patient in regards to the recall on the Losartan HCTZ she is taking. She has contacted her pharmacy to see if her medication was part of the recall. I spoke with her husband and he states that she is aware and has already talked to her pharmacist.

## 2018-03-07 ENCOUNTER — Other Ambulatory Visit: Payer: Self-pay

## 2018-03-07 NOTE — Patient Outreach (Signed)
[facility]) Care Management  03/07/2018  Hannah Lindsey 01-18-1946 [id number]   Medication Adherence call to Hannah Lindsey left a message for patient to call back patient is due on Losartan 100 mg and Rosuvastatin 10 mg. Charlette Caffey said patient has not pick up but, patient works at [employer] and will ask.Hannah Lindsey is showing past due under [place].   [place] Management Direct Dial [phone]  Fax [phone] [email or web address]

## 2018-05-10 ENCOUNTER — Encounter: Payer: Self-pay | Admitting: Family Medicine

## 2018-05-10 ENCOUNTER — Ambulatory Visit (INDEPENDENT_AMBULATORY_CARE_PROVIDER_SITE_OTHER): Payer: Medicare Other | Admitting: Family Medicine

## 2018-05-10 VITALS — BP 134/76 | HR 72 | Temp 97.9°F | Resp 16 | Ht 65.0 in | Wt 169.2 lb

## 2018-05-10 DIAGNOSIS — I10 Essential (primary) hypertension: Secondary | ICD-10-CM

## 2018-05-10 DIAGNOSIS — Z1239 Encounter for other screening for malignant neoplasm of breast: Secondary | ICD-10-CM

## 2018-05-10 DIAGNOSIS — J302 Other seasonal allergic rhinitis: Secondary | ICD-10-CM

## 2018-05-10 DIAGNOSIS — J3089 Other allergic rhinitis: Secondary | ICD-10-CM

## 2018-05-10 DIAGNOSIS — R739 Hyperglycemia, unspecified: Secondary | ICD-10-CM

## 2018-05-10 DIAGNOSIS — G4709 Other insomnia: Secondary | ICD-10-CM

## 2018-05-10 DIAGNOSIS — E7800 Pure hypercholesterolemia, unspecified: Secondary | ICD-10-CM

## 2018-05-10 DIAGNOSIS — Z23 Encounter for immunization: Secondary | ICD-10-CM | POA: Diagnosis not present

## 2018-05-10 DIAGNOSIS — Z1211 Encounter for screening for malignant neoplasm of colon: Secondary | ICD-10-CM

## 2018-05-10 MED ORDER — ROSUVASTATIN CALCIUM 10 MG PO TABS: 10.0000 mg | ORAL_TABLET | Freq: Every day | ORAL | 1 refills | Status: DC

## 2018-05-10 MED ORDER — LOSARTAN POTASSIUM 100 MG PO TABS: 100.0000 mg | ORAL_TABLET | Freq: Every day | ORAL | 1 refills | Status: DC

## 2018-05-10 NOTE — Progress Notes (Signed)
Name: Hannah Lindsey   MRN: [id number]    DOB: 1946/02/05   Date:05/10/2018       Progress Note  Subjective  Chief Complaint  Chief Complaint  Patient presents with  . Medication Refill  . Allergic Rhinitis     Unchanged  . Hyperlipidemia  . Hypertension    Denies any symptoms  . Osteopenia    HPI  HTN: taking medication, bp slightly up today but she is under stress. She states she just moved and is still unpacking. No chest pain or palpitation. Denies SOB or decrease in exercise tolerance.   Hyperlipidemia: she states used to have headache with Crestor, she was switched simvastatin but LDL went up, she is back on Crestor 10 mg since January 2019 and labs done in January showed a drop of LDL from 148 to 104, she denies any side effects of medication at this time.   Osteopenia: low FRAX score, on high calcium diet, taking supplements and never had a fracture  Perennial allergic rhinitis: she is back on  Benadryl, using nasal spray occasionally, states loratadine only worked for a couple of weeks.     Patient Active Problem List   Diagnosis Date Noted  . Perennial allergic rhinitis with seasonal variation 11/09/2017  . Osteopenia after menopause 11/09/2017  . Post-menopausal 09/06/2016  . Hyperlipidemia 06/19/2016  . Essential hypertension 06/04/2015    Past Surgical History:  Procedure Laterality Date  . TONSILLECTOMY      Family History  Problem Relation Age of Onset  . Fibromyalgia Mother   . Heart Problems Mother   . Dementia Father   . Heart Problems Father   . Heart disease Father   . Breast cancer Maternal Grandmother     Social History   Socioeconomic History  . Marital status: Married    Spouse name: Jenny Reichmann  . Number of children: 1  . Years of education: Not on file  . Highest education level: Bachelor's degree (e.g., BA, AB, BS)  Occupational History    Employer: [employer]  Social Needs  . Financial resource strain: Not hard at all  .  Food insecurity:    Worry: Never true    Inability: Never true  . Transportation needs:    Medical: No    Non-medical: No  Tobacco Use  . Smoking status: Never Smoker  . Smokeless tobacco: Never Used  Substance and Sexual Activity  . Alcohol use: Yes    Alcohol/week: 1.0 standard drinks    Types: 1 Glasses of wine per week    Comment: nightly  . Drug use: No  . Sexual activity: Not Currently    Partners: Male    Birth control/protection: Post-menopausal  Lifestyle  . Physical activity:    Days per week: 4 days    Minutes per session: 40 min  . Stress: Not at all  Relationships  . Social connections:    Talks on phone: Patient refused    Gets together: Patient refused    Attends religious service: Patient refused    Active member of club or organization: Patient refused    Attends meetings of clubs or organizations: Patient refused    Relationship status: Married  . Intimate partner violence:    Fear of current or ex partner: No    Emotionally abused: No    Physically abused: No    Forced sexual activity: No  Other Topics Concern  . Not on file  Social History Narrative  . Not on file  Current Outpatient Medications:  .  Calcium Carbonate-Vitamin D (CALCIUM-VITAMIN D) 500-200 MG-UNIT per tablet, Take 1 tablet by mouth daily., Disp: , Rfl:  .  fluticasone (FLONASE) 50 MCG/ACT nasal spray, Place 2 sprays into both nostrils daily., Disp: 16 g, Rfl: 6 .  losartan (COZAAR) 100 MG tablet, Take 1 tablet (100 mg total) by mouth daily., Disp: 90 tablet, Rfl: 1 .  rosuvastatin (CRESTOR) 10 MG tablet, Take 1 tablet (10 mg total) by mouth daily., Disp: 90 tablet, Rfl: 1 .  aspirin 81 MG tablet, Take 1 tablet (81 mg total) by mouth daily. (Patient not taking: Reported on 05/10/2018), Disp: 30 tablet, Rfl: 12 .  loratadine (CLARITIN) 10 MG tablet, Take 1 tablet (10 mg total) by mouth daily. (Patient not taking: Reported on 05/10/2018), Disp: 90 tablet, Rfl: 1  No Known  Allergies  I personally reviewed active problem list, medication list, allergies, family history, social history with the patient/caregiver today.   ROS  Constitutional: Negative for fever or weight change.  Respiratory: Negative for cough and shortness of breath.   Cardiovascular: Negative for chest pain or palpitations.  Gastrointestinal: Negative for abdominal pain, no bowel changes.  Musculoskeletal: Negative for gait problem or joint swelling.  Skin: Negative for rash.  Neurological: Negative for dizziness or headache.  No other specific complaints in a complete review of systems (except as listed in HPI above).  Objective  Vitals:   05/10/18 0826  BP: (!) 146/72  Pulse: 72  Resp: 16  Temp: 97.9 F (36.6 C)  TempSrc: Oral  SpO2: 97%  Weight: 169 lb 3.2 oz (76.7 kg)  Height: 5\' 5"  (1.651 m)    Body mass index is 28.16 kg/m.  Physical Exam  Constitutional: Patient appears well-developed and well-nourished. Overweight.  No distress.  HEENT: head atraumatic, normocephalic, pupils equal and reactive to light, neck supple, throat within normal limits Cardiovascular: Normal rate, regular rhythm and normal heart sounds.  No murmur heard. No BLE edema. Pulmonary/Chest: Effort normal and breath sounds normal. No respiratory distress. Abdominal: Soft.  There is no tenderness. Psychiatric: Patient has a normal mood and affect. behavior is normal. Judgment and thought content normal.  PHQ2/9: Depression screen [facility] 2/9 05/10/2018 11/09/2017 11/09/2017 11/09/2017 08/10/2017  Decreased Interest 0 0 0 0 0  Down, Depressed, Hopeless 0 0 0 0 0  PHQ - 2 Score 0 0 0 0 0  Altered sleeping 1 0 1 - -  Tired, decreased energy 0 0 0 - -  Change in appetite 0 0 0 - -  Feeling bad or failure about yourself  0 0 0 - -  Trouble concentrating 0 0 0 - -  Moving slowly or fidgety/restless 0 0 0 - -  Suicidal thoughts 0 0 0 - -  PHQ-9 Score 1 0 1 - -  Difficult doing work/chores Not difficult at  all Not difficult at all Not difficult at all - -     Fall Risk: Fall Risk  05/10/2018 11/09/2017 11/09/2017 08/10/2017 01/15/2017  Falls in the past year? No No No No No  Risk for fall due to : - Impaired vision - - -  Risk for fall due to: Comment - wears eyeglasses - - -     Functional Status Survey: Is the patient deaf or have difficulty hearing?: No Does the patient have difficulty seeing, even when wearing glasses/contacts?: Yes(driving glasses) Does the patient have difficulty concentrating, remembering, or making decisions?: No Does the patient have difficulty walking or climbing  stairs?: No Does the patient have difficulty dressing or bathing?: No Does the patient have difficulty doing errands alone such as visiting a doctor's office or shopping?: No    Assessment & Plan   1. Essential hypertension  - losartan (COZAAR) 100 MG tablet; Take 1 tablet (100 mg total) by mouth daily.  Dispense: 90 tablet; Refill: 1  2. Need for immunization against influenza  - Flu vaccine HIGH DOSE PF (Fluzone High dose)  3. Pure hypercholesterolemia  - rosuvastatin (CRESTOR) 10 MG tablet; Take 1 tablet (10 mg total) by mouth daily.  Dispense: 90 tablet; Refill: 1  4. Hyperglycemia  Recheck yearly  5. Perennial allergic rhinitis with seasonal variation  Stable  6. Other insomnia  Sleep better now  7. Colon cancer screening  - Ambulatory referral to Gastroenterology  8. Breast cancer screening  - MM 3D SCREEN BREAST BILATERAL; Future

## 2018-05-31 DIAGNOSIS — Z86010 Personal history of colonic polyps: Secondary | ICD-10-CM | POA: Diagnosis not present

## 2018-06-17 ENCOUNTER — Other Ambulatory Visit: Payer: Self-pay

## 2018-06-17 NOTE — Patient Outreach (Signed)
[facility]) Care Management  06/17/2018  Hannah Lindsey 1946-04-29 [id number]   Medication Adherence call to Mrs : Hannah Lindsey left a message for patient to call back patient is due on Rosuvastatin 10 mg and Lovastatin 100 mg Hannah Lindsey is showing past due under [facility].   [facility] Management Direct Dial [phone]  Fax [phone] [email or web address]

## 2018-08-19 ENCOUNTER — Ambulatory Visit
Admission: RE | Admit: 2018-08-19 | Discharge: 2018-08-19 | Disposition: A | Discharge status: Home / Self Care | Payer: Medicare Other | Attending: Unknown Physician Specialty | Admitting: Unknown Physician Specialty

## 2018-08-19 ENCOUNTER — Encounter: Payer: Self-pay | Admitting: *Deleted

## 2018-08-19 ENCOUNTER — Ambulatory Visit: Payer: Medicare Other | Admitting: Anesthesiology

## 2018-08-19 ENCOUNTER — Encounter
Admission: RE | Disposition: A | Discharge status: Home / Self Care | Payer: Self-pay | Source: Home / Self Care | Attending: Unknown Physician Specialty

## 2018-08-19 DIAGNOSIS — Z79899 Other long term (current) drug therapy: Secondary | ICD-10-CM | POA: Insufficient documentation

## 2018-08-19 DIAGNOSIS — Z86010 Personal history of colonic polyps: Secondary | ICD-10-CM | POA: Insufficient documentation

## 2018-08-19 DIAGNOSIS — K640 First degree hemorrhoids: Secondary | ICD-10-CM | POA: Diagnosis not present

## 2018-08-19 DIAGNOSIS — K648 Other hemorrhoids: Secondary | ICD-10-CM | POA: Diagnosis not present

## 2018-08-19 DIAGNOSIS — E785 Hyperlipidemia, unspecified: Secondary | ICD-10-CM | POA: Insufficient documentation

## 2018-08-19 DIAGNOSIS — K5730 Diverticulosis of large intestine without perforation or abscess without bleeding: Secondary | ICD-10-CM | POA: Diagnosis not present

## 2018-08-19 DIAGNOSIS — D126 Benign neoplasm of colon, unspecified: Secondary | ICD-10-CM | POA: Diagnosis not present

## 2018-08-19 DIAGNOSIS — K5790 Diverticulosis of intestine, part unspecified, without perforation or abscess without bleeding: Secondary | ICD-10-CM | POA: Diagnosis not present

## 2018-08-19 DIAGNOSIS — I10 Essential (primary) hypertension: Secondary | ICD-10-CM | POA: Insufficient documentation

## 2018-08-19 DIAGNOSIS — D123 Benign neoplasm of transverse colon: Secondary | ICD-10-CM | POA: Insufficient documentation

## 2018-08-19 DIAGNOSIS — Z1211 Encounter for screening for malignant neoplasm of colon: Secondary | ICD-10-CM | POA: Insufficient documentation

## 2018-08-19 DIAGNOSIS — K635 Polyp of colon: Secondary | ICD-10-CM | POA: Diagnosis not present

## 2018-08-19 HISTORY — PX: COLONOSCOPY WITH PROPOFOL: SHX5780

## 2018-08-19 SURGERY — COLONOSCOPY WITH PROPOFOL
Anesthesia: General

## 2018-08-19 MED ORDER — MIDAZOLAM HCL 2 MG/2ML IJ SOLN
INTRAMUSCULAR | Status: AC
  Filled 2018-08-19: qty 2

## 2018-08-19 MED ORDER — SODIUM CHLORIDE 0.9 % IV SOLN: INTRAVENOUS | Status: DC

## 2018-08-19 MED ORDER — PROPOFOL 10 MG/ML IV BOLUS
INTRAVENOUS | Status: DC | PRN
  Administered 2018-08-19: 100 mg via INTRAVENOUS

## 2018-08-19 MED ORDER — SODIUM CHLORIDE 0.9 % IV SOLN
INTRAVENOUS | Status: DC
  Administered 2018-08-19: 10:00:00 via INTRAVENOUS

## 2018-08-19 MED ORDER — FENTANYL CITRATE (PF) 100 MCG/2ML IJ SOLN
INTRAMUSCULAR | Status: AC
  Filled 2018-08-19: qty 2

## 2018-08-19 MED ORDER — PHENYLEPHRINE HCL 10 MG/ML IJ SOLN
INTRAMUSCULAR | Status: DC | PRN
  Administered 2018-08-19: 100 ug via INTRAVENOUS

## 2018-08-19 MED ORDER — MIDAZOLAM HCL 5 MG/5ML IJ SOLN
INTRAMUSCULAR | Status: DC | PRN
  Administered 2018-08-19 (×2): 1 mg via INTRAVENOUS

## 2018-08-19 MED ORDER — PROPOFOL 500 MG/50ML IV EMUL
INTRAVENOUS | Status: DC | PRN
  Administered 2018-08-19: 190 ug/kg/min via INTRAVENOUS

## 2018-08-19 MED ORDER — LIDOCAINE 2% (20 MG/ML) 5 ML SYRINGE
INTRAMUSCULAR | Status: DC | PRN
  Administered 2018-08-19: 30 mg via INTRAVENOUS

## 2018-08-19 MED ORDER — FENTANYL CITRATE (PF) 100 MCG/2ML IJ SOLN
INTRAMUSCULAR | Status: DC | PRN
  Administered 2018-08-19 (×2): 50 ug via INTRAVENOUS

## 2018-08-19 MED ORDER — EPHEDRINE SULFATE 50 MG/ML IJ SOLN
INTRAMUSCULAR | Status: DC | PRN
  Administered 2018-08-19: 10 mg via INTRAVENOUS

## 2018-08-19 MED ORDER — PROPOFOL 500 MG/50ML IV EMUL
INTRAVENOUS | Status: AC
  Filled 2018-08-19: qty 50

## 2018-08-19 NOTE — Anesthesia Preprocedure Evaluation (Addendum)
Anesthesia Evaluation  Patient identified by MRN, date of birth, ID band Patient awake    Reviewed: Allergy & Precautions, H&P , NPO status , Patient's Chart, lab work & pertinent test results  Airway Mallampati: II       Dental  (+) Poor Dentition   Pulmonary neg pulmonary ROS, neg COPD,           Cardiovascular hypertension, (-) angina     Neuro/Psych negative neurological ROS  negative psych ROS   GI/Hepatic negative GI ROS, Neg liver ROS,   Endo/Other  negative endocrine ROS  Renal/GU negative Renal ROS  negative genitourinary   Musculoskeletal   Abdominal   Peds  Hematology negative hematology ROS (+)   Anesthesia Other Findings Past Medical History: No date: Allergy No date: Hyperglycemia No date: Hyperlipidemia No date: Hypertension  Past Surgical History: No date: TONSILLECTOMY  BMI    Body Mass Index:  27.46 kg/m      Reproductive/Obstetrics negative OB ROS                            Anesthesia Physical Anesthesia Plan  ASA: II  Anesthesia Plan: General   Post-op Pain Management:    Induction:   PONV Risk Score and Plan: Propofol infusion and TIVA  Airway Management Planned: Natural Airway and Nasal Cannula  Additional Equipment:   Intra-op Plan:   Post-operative Plan:   Informed Consent: I have reviewed the patients History and Physical, chart, labs and discussed the procedure including the risks, benefits and alternatives for the proposed anesthesia with the patient or authorized representative who has indicated his/her understanding and acceptance.   Dental Advisory Given  Plan Discussed with: Anesthesiologist  Anesthesia Plan Comments:         Anesthesia Quick Evaluation

## 2018-08-19 NOTE — Transfer of Care (Signed)
Immediate Anesthesia Transfer of Care Note  Patient: Hannah Lindsey  Procedure(s) Performed: COLONOSCOPY WITH PROPOFOL (N/A )  Patient Location: PACU and Endoscopy Unit  Anesthesia Type:General  Level of Consciousness: drowsy  Airway & Oxygen Therapy: Patient Spontanous Breathing and Patient connected to nasal cannula oxygen  Post-op Assessment: Report given to RN and Post -op Vital signs reviewed and stable  Post vital signs: Reviewed and stable  Last Vitals:  Vitals Value Taken Time  BP    Temp 36.3 C 08/19/2018 11:00 AM  Pulse    Resp    SpO2      Last Pain:  Vitals:   08/19/18 0935  TempSrc: Tympanic         Complications: No apparent anesthesia complications

## 2018-08-19 NOTE — H&P (Signed)
Primary Care Physician:  Steele Sizer, MD Primary Gastroenterologist:  Dr. Vira Agar  Pre-Procedure History & Physical: HPI:  Hannah Lindsey is a 73 y.o. female is here for an colonoscopy.  This is for previous history of colon polyps.last one 02/25/13.   Past Medical History:  Diagnosis Date  . Allergy   . Hyperglycemia   . Hyperlipidemia   . Hypertension     Past Surgical History:  Procedure Laterality Date  . TONSILLECTOMY      Prior to Admission medications   Medication Sig Start Date End Date Taking? Authorizing Provider  losartan (COZAAR) 100 MG tablet Take 1 tablet (100 mg total) by mouth daily. 05/10/18  Yes Sowles, Drue Stager, MD  rosuvastatin (CRESTOR) 10 MG tablet Take 1 tablet (10 mg total) by mouth daily. 05/10/18  Yes Steele Sizer, MD  aspirin 81 MG tablet Take 1 tablet (81 mg total) by mouth daily. Patient not taking: Reported on 05/10/2018 04/19/15   Ashok Norris, MD  Calcium Carbonate-Vitamin D (CALCIUM-VITAMIN D) 500-200 MG-UNIT per tablet Take 1 tablet by mouth daily.    [provider]  fluticasone (FLONASE) 50 MCG/ACT nasal spray Place 2 sprays into both nostrils daily. Patient not taking: Reported on 08/19/2018 11/09/17   Steele Sizer, MD  loratadine (CLARITIN) 10 MG tablet Take 1 tablet (10 mg total) by mouth daily. Patient not taking: Reported on 05/10/2018 11/09/17   Steele Sizer, MD    Allergies as of 06/19/2018  . (No Known Allergies)    Family History  Problem Relation Age of Onset  . Fibromyalgia Mother   . Heart Problems Mother   . Dementia Father   . Heart Problems Father   . Heart disease Father   . Breast cancer Maternal Grandmother   . Cancer Maternal Grandmother   . Cancer Maternal Aunt     Social History   Socioeconomic History  . Marital status: Married    Spouse name: Jenny Reichmann  . Number of children: 1  . Years of education: Not on file  . Highest education level: Bachelor's degree (e.g., BA, AB, BS)   Occupational History  . Occupation: Scientist, clinical (histocompatibility and immunogenetics) for [employer]  Social Needs  . Financial resource strain: Not hard at all  . Food insecurity:    Worry: Never true    Inability: Never true  . Transportation needs:    Medical: No    Non-medical: No  Tobacco Use  . Smoking status: Never Smoker  . Smokeless tobacco: Never Used  Substance and Sexual Activity  . Alcohol use: Yes    Alcohol/week: 1.0 standard drinks    Types: 1 Glasses of wine per week    Comment: nightly  . Drug use: No  . Sexual activity: Not Currently    Partners: Male    Birth control/protection: Post-menopausal  Lifestyle  . Physical activity:    Days per week: 4 days    Minutes per session: 40 min  . Stress: Not at all  Relationships  . Social connections:    Talks on phone: Patient refused    Gets together: Patient refused    Attends religious service: Patient refused    Active member of club or organization: Patient refused    Attends meetings of clubs or organizations: Patient refused    Relationship status: Married  . Intimate partner violence:    Fear of current or ex partner: No    Emotionally abused: No    Physically abused: No  Forced sexual activity: No  Other Topics Concern  . Not on file  Social History Narrative  . Not on file    Review of Systems: See HPI, otherwise negative ROS  Physical Exam: BP (!) 143/72   Pulse 81   Temp (!) 97.4 F (36.3 C) (Tympanic)   Resp 18   Ht 5\' 5"  (1.651 m)   Wt 74.8 kg   SpO2 100%   BMI 27.46 kg/m  General:   Alert,  pleasant and cooperative in NAD Head:  Normocephalic and atraumatic. Neck:  Supple; no masses or thyromegaly. Lungs:  Clear throughout to auscultation.    Heart:  Regular rate and rhythm. Abdomen:  Soft, nontender and nondistended. Normal bowel sounds, without guarding, and without rebound.   Neurologic:  Alert and  oriented x4;  grossly normal neurologically.  Impression/Plan: Hannah Lindsey is here for an colonoscopy to be performed for [facility] colon polyps.  Risks, benefits, limitations, and alternatives regarding  colonoscopy have been reviewed with the patient.  Questions have been answered.  All parties agreeable.   Gaylyn Cheers, MD  08/19/2018, 10:28 AM

## 2018-08-19 NOTE — Anesthesia Post-op Follow-up Note (Signed)
Anesthesia QCDR form completed.        

## 2018-08-19 NOTE — Op Note (Signed)
[facility] Gastroenterology Patient Name: Hannah Lindsey Procedure Date: 08/19/2018 10:29 AM MRN: [id number] Account #: [id number] Date of Birth: 1946-06-04 Admit Type: Outpatient Age: 73 Room: [facility] ENDO ROOM 3 Gender: Female Note Status: Finalized Procedure:            Colonoscopy Indications:          High risk colon cancer surveillance: Personal history                        of colonic polyps Providers:            Manya Silvas, MD Referring MD:         Bethena Roys. Sowles, MD (Referring MD) Medicines:            Propofol per Anesthesia Complications:        No immediate complications. Procedure:            Pre-Anesthesia Assessment:                       - After reviewing the risks and benefits, the patient                        was deemed in satisfactory condition to undergo the                        procedure.                       After obtaining informed consent, the colonoscope was                        passed under direct vision. Throughout the procedure,                        the patient's blood pressure, pulse, and oxygen                        saturations were monitored continuously. The                        Colonoscope was introduced through the anus and                        advanced to the the cecum, identified by appendiceal                        orifice and ileocecal valve. The colonoscopy was                        performed without difficulty. The patient tolerated the                        procedure well. The quality of the bowel preparation                        was excellent. Findings:      A diminutive polyp was found in the transverse colon. The polyp was       sessile. The polyp was removed with a jumbo cold forceps. Resection and       retrieval were complete.      A single small-mouthed diverticulum  was found in the sigmoid colon.      Internal hemorrhoids were found during endoscopy. The hemorrhoids were       small  and Grade I (internal hemorrhoids that do not prolapse).      The exam was otherwise without abnormality. Impression:           - One diminutive polyp in the transverse colon, removed                        with a jumbo cold forceps. Resected and retrieved.                       - Diverticulosis in the sigmoid colon.                       - Internal hemorrhoids.                       - The examination was otherwise normal. Recommendation:       - Await pathology results. Manya Silvas, MD 08/19/2018 10:58:56 AM This report has been signed electronically. Number of Addenda: 0 Note Initiated On: 08/19/2018 10:29 AM Scope Withdrawal Time: 0 hours 9 minutes 9 seconds  Total Procedure Duration: 0 hours 18 minutes 35 seconds       [facility]

## 2018-08-20 LAB — HM COLONOSCOPY

## 2018-08-20 LAB — SURGICAL PATHOLOGY

## 2018-08-20 NOTE — Anesthesia Postprocedure Evaluation (Signed)
Anesthesia Post Note  Patient: Hannah Lindsey  Procedure(s) Performed: COLONOSCOPY WITH PROPOFOL (N/A )  Patient location during evaluation: PACU Anesthesia Type: General Level of consciousness: awake and alert Pain management: pain level controlled Vital Signs Assessment: post-procedure vital signs reviewed and stable Respiratory status: spontaneous breathing, nonlabored ventilation and respiratory function stable Cardiovascular status: blood pressure returned to baseline and stable Postop Assessment: no apparent nausea or vomiting Anesthetic complications: no     Last Vitals:  Vitals:   08/19/18 1120 08/19/18 1130  BP: 134/70 (!) 144/68  Pulse: 67 60  Resp: 13 14  Temp:    SpO2: 96% 98%    Last Pain:  Vitals:   08/20/18 0906  TempSrc:   PainSc: 0-No pain                 Durenda Hurt

## 2018-08-21 ENCOUNTER — Encounter: Payer: Self-pay | Admitting: Unknown Physician Specialty

## 2018-08-27 ENCOUNTER — Other Ambulatory Visit: Payer: Self-pay | Admitting: Family Medicine

## 2018-08-27 DIAGNOSIS — J302 Other seasonal allergic rhinitis: Secondary | ICD-10-CM

## 2018-08-27 DIAGNOSIS — J3089 Other allergic rhinitis: Secondary | ICD-10-CM

## 2018-08-27 NOTE — Telephone Encounter (Signed)
Refill request for general medication. Loratadine to [facility].   Last office visit 05/10/2018   Follow up on 11/08/2018

## 2018-10-23 ENCOUNTER — Other Ambulatory Visit: Payer: Self-pay

## 2018-10-23 ENCOUNTER — Ambulatory Visit
Admission: RE | Admit: 2018-10-23 | Discharge: 2018-10-23 | Disposition: A | Discharge status: Home / Self Care | Payer: Medicare Other | Source: Ambulatory Visit | Attending: Family Medicine | Admitting: Family Medicine

## 2018-10-23 DIAGNOSIS — Z1231 Encounter for screening mammogram for malignant neoplasm of breast: Secondary | ICD-10-CM | POA: Diagnosis not present

## 2018-10-23 DIAGNOSIS — Z1239 Encounter for other screening for malignant neoplasm of breast: Secondary | ICD-10-CM

## 2018-10-23 IMAGING — MG DIGITAL SCREENING BILATERAL MAMMOGRAM WITH TOMO AND CAD
6 of 12 series · 6 of 36 positions shown · non-contrast
Comparison: Previous exam(s).

CLINICAL DATA: Screening.

EXAM:
DIGITAL SCREENING BILATERAL MAMMOGRAM WITH TOMO AND CAD

[R CC synth-2D (1 of 2)]
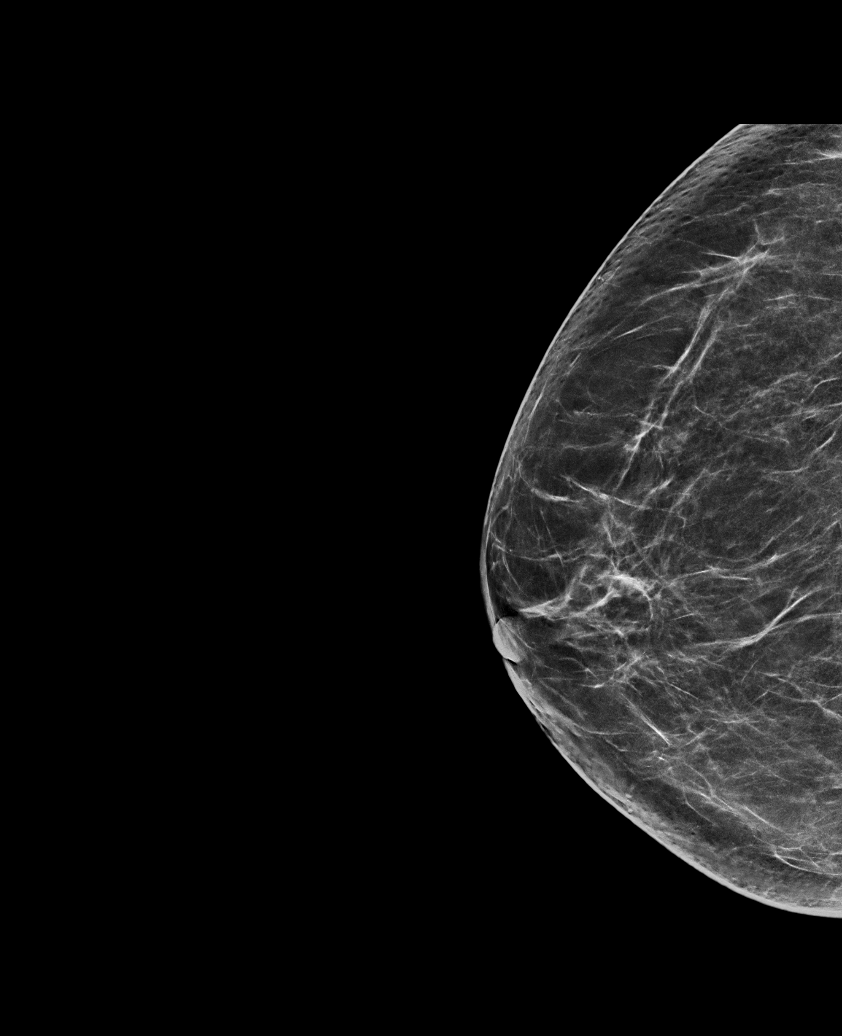

[R MLO synth-2D (1 of 2)]
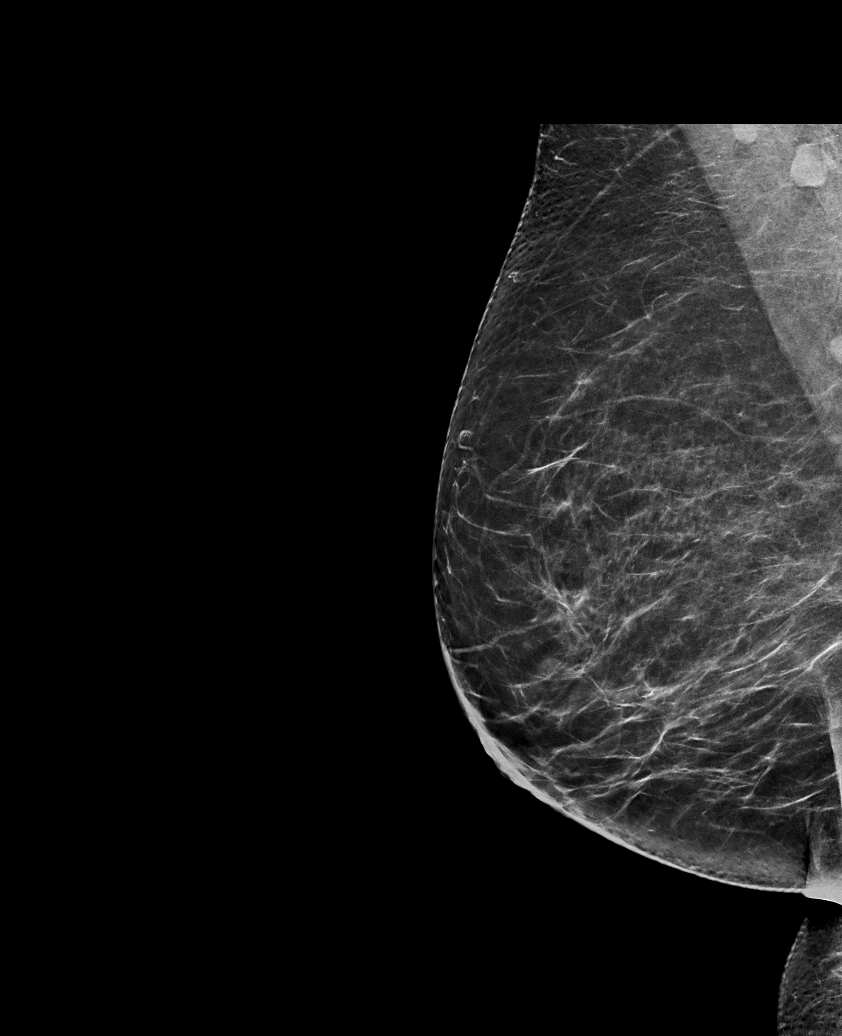

[L MLO synth-2D]
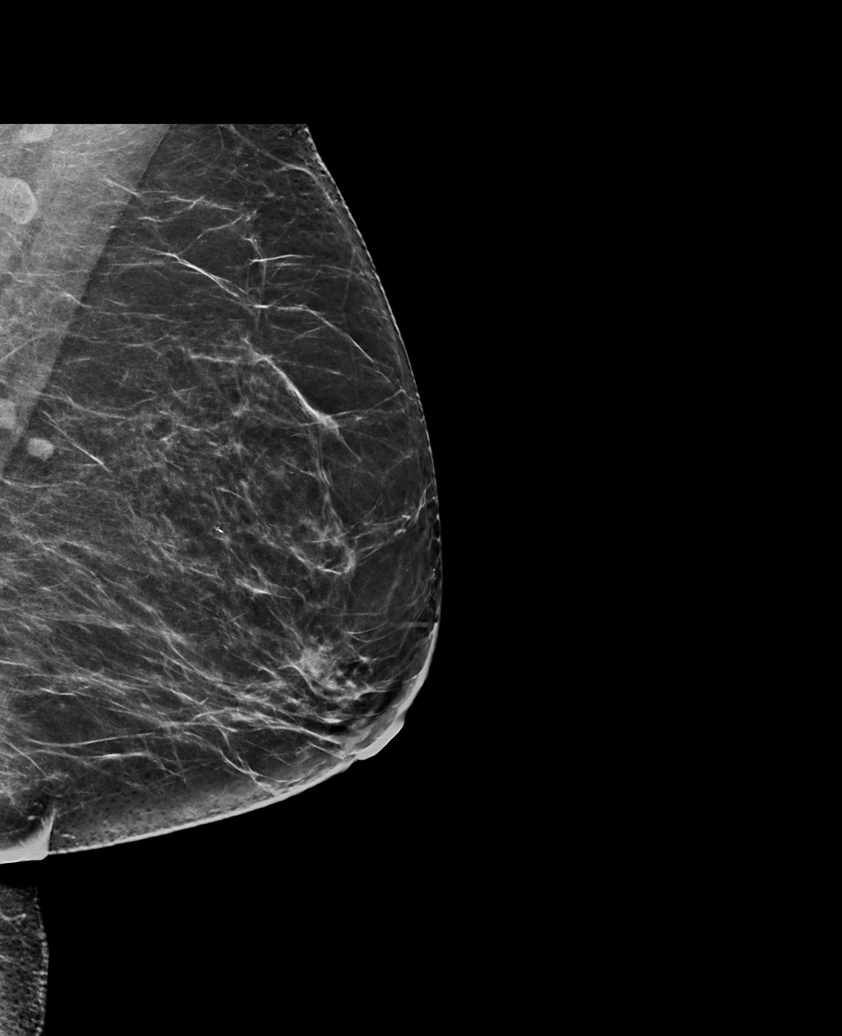

[L CC synth-2D]
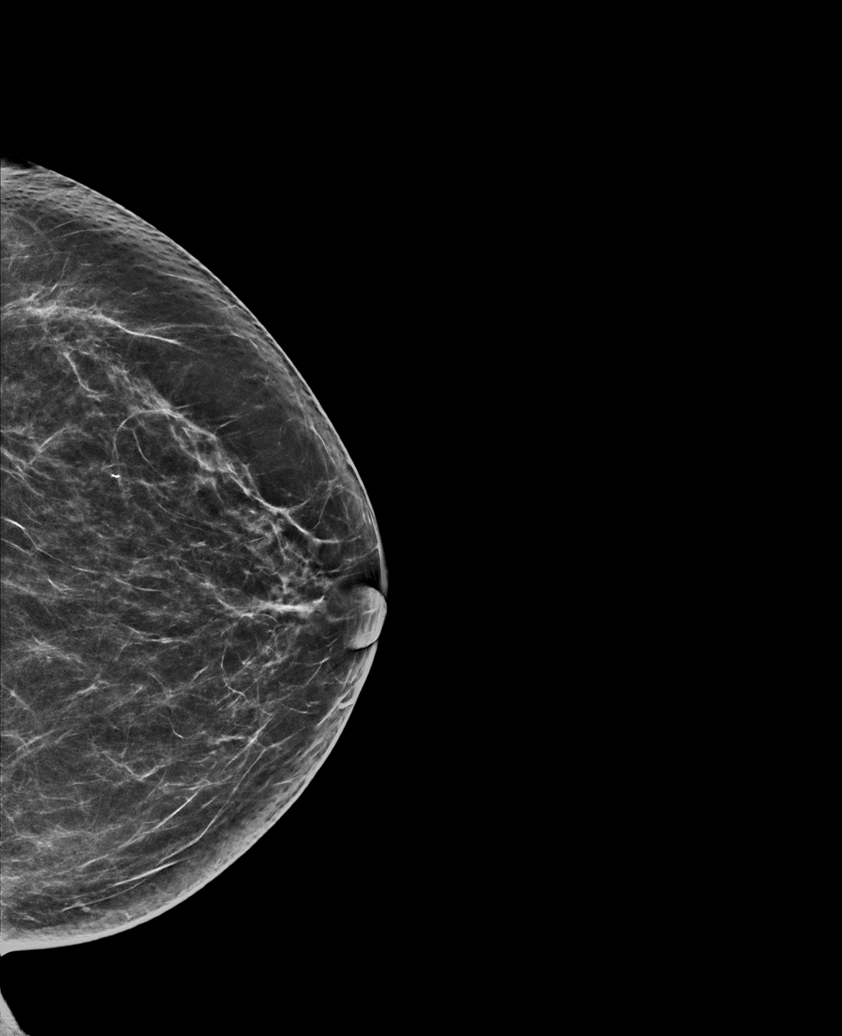

[R CC synth-2D (2 of 2)]
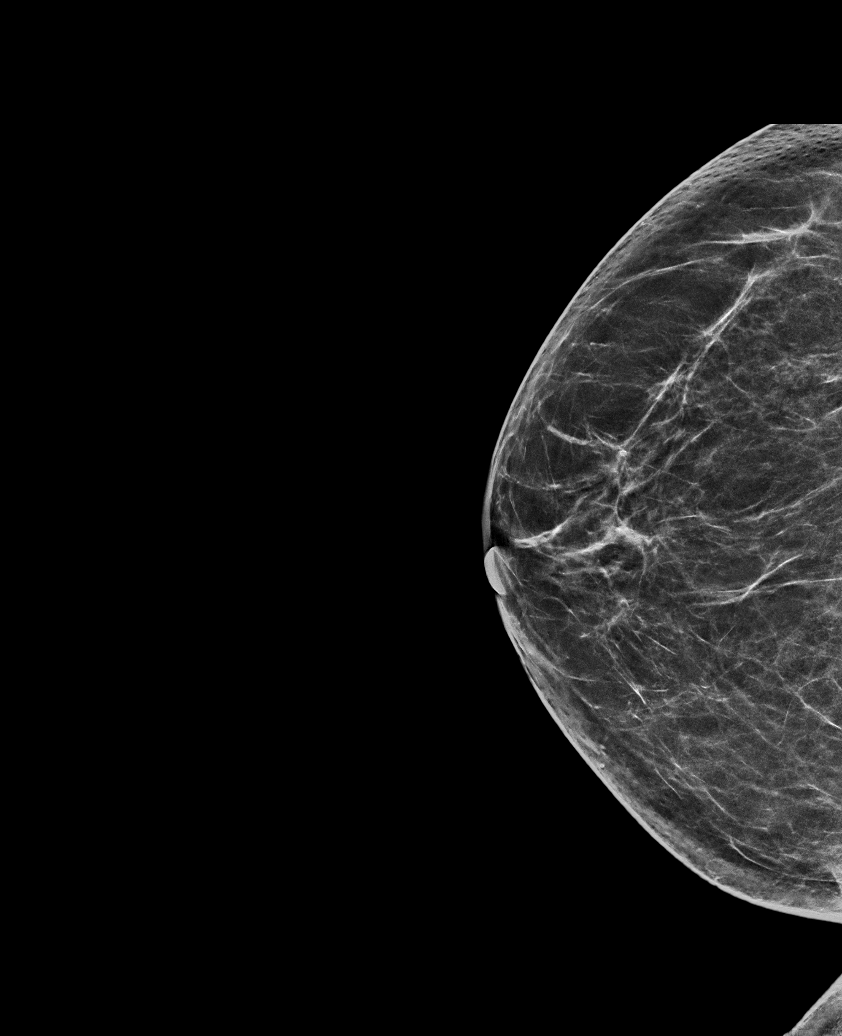

[R MLO synth-2D (2 of 2)]
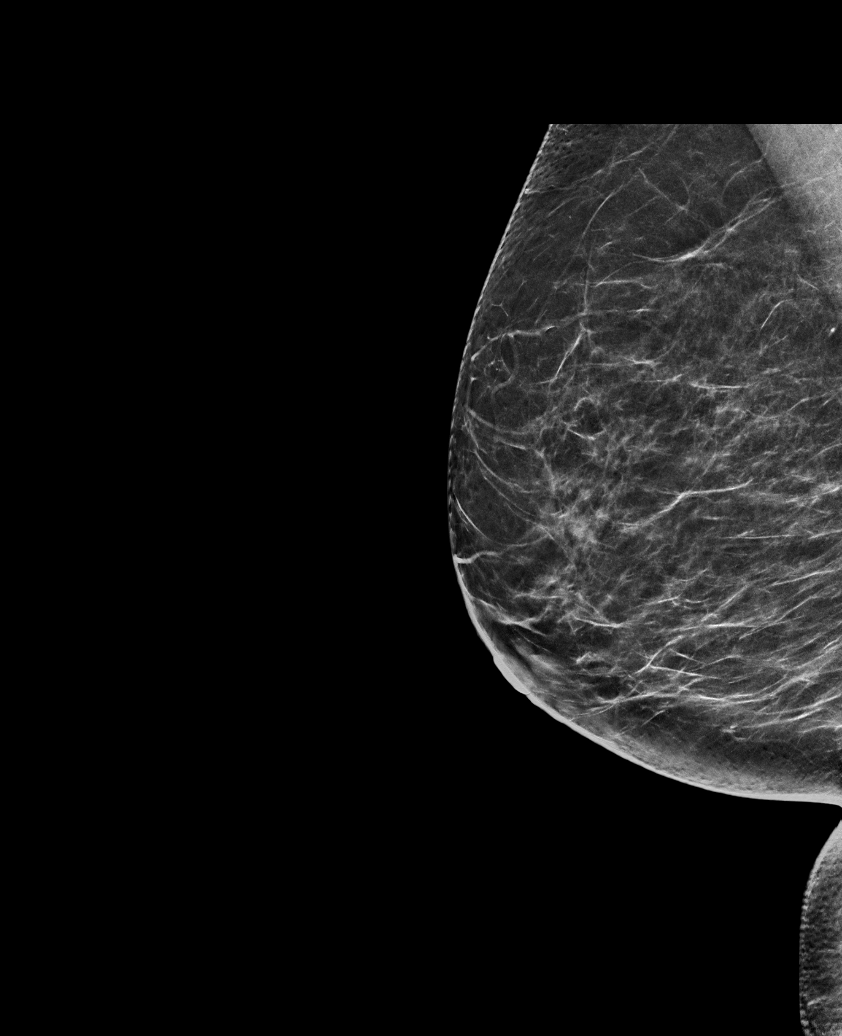

[6 of 36 positions shown; findings below may reference images not displayed]

ACR Breast Density Category b: There are scattered areas of
fibroglandular density.
FINDINGS: There are no findings suspicious for malignancy. Images were
processed with CAD.
IMPRESSION: No mammographic evidence of malignancy. A result letter of this
screening mammogram will be mailed directly to the patient.

RECOMMENDATION:
Screening mammogram in one year. (Code:[TQ])

BI-RADS CATEGORY  1: Negative.

## 2018-10-29 ENCOUNTER — Other Ambulatory Visit: Payer: Self-pay | Admitting: Family Medicine

## 2018-10-29 DIAGNOSIS — J3089 Other allergic rhinitis: Secondary | ICD-10-CM

## 2018-10-29 DIAGNOSIS — J302 Other seasonal allergic rhinitis: Secondary | ICD-10-CM

## 2018-10-29 NOTE — Telephone Encounter (Signed)
Refill request for general medication. Claritin to Hannah Lindsey   Last office visit 05/10/2018   Follow up on 11/08/2018

## 2018-11-08 ENCOUNTER — Ambulatory Visit: Payer: Medicare Other | Admitting: Family Medicine

## 2018-12-25 ENCOUNTER — Ambulatory Visit (INDEPENDENT_AMBULATORY_CARE_PROVIDER_SITE_OTHER): Payer: Medicare Other | Admitting: Family Medicine

## 2018-12-25 ENCOUNTER — Other Ambulatory Visit: Payer: Self-pay

## 2018-12-25 ENCOUNTER — Encounter: Payer: Self-pay | Admitting: Family Medicine

## 2018-12-25 VITALS — BP 152/78 | HR 78 | Temp 98.0°F | Resp 16 | Ht 65.0 in | Wt 170.2 lb

## 2018-12-25 DIAGNOSIS — E7800 Pure hypercholesterolemia, unspecified: Secondary | ICD-10-CM

## 2018-12-25 DIAGNOSIS — J3089 Other allergic rhinitis: Secondary | ICD-10-CM | POA: Diagnosis not present

## 2018-12-25 DIAGNOSIS — G4709 Other insomnia: Secondary | ICD-10-CM

## 2018-12-25 DIAGNOSIS — M810 Age-related osteoporosis without current pathological fracture: Secondary | ICD-10-CM | POA: Diagnosis not present

## 2018-12-25 DIAGNOSIS — I10 Essential (primary) hypertension: Secondary | ICD-10-CM | POA: Diagnosis not present

## 2018-12-25 DIAGNOSIS — R739 Hyperglycemia, unspecified: Secondary | ICD-10-CM

## 2018-12-25 DIAGNOSIS — F439 Reaction to severe stress, unspecified: Secondary | ICD-10-CM

## 2018-12-25 DIAGNOSIS — Z780 Asymptomatic menopausal state: Secondary | ICD-10-CM

## 2018-12-25 DIAGNOSIS — J302 Other seasonal allergic rhinitis: Secondary | ICD-10-CM

## 2018-12-25 MED ORDER — FLUTICASONE PROPIONATE 50 MCG/ACT NA SUSP: 2.0000 | Freq: Every day | NASAL | 6 refills | Status: DC

## 2018-12-25 MED ORDER — PRAVASTATIN SODIUM 40 MG PO TABS: 40.0000 mg | ORAL_TABLET | Freq: Every day | ORAL | 1 refills | Status: DC

## 2018-12-25 MED ORDER — VALSARTAN-HYDROCHLOROTHIAZIDE 160-12.5 MG PO TABS: 1.0000 | ORAL_TABLET | Freq: Every day | ORAL | 0 refills | Status: DC

## 2018-12-25 NOTE — Progress Notes (Signed)
Name: Hannah Lindsey   MRN: [id number]    DOB: Jan 14, 1946   Date:12/25/2018       Progress Note  Subjective  Chief Complaint  Chief Complaint  Patient presents with  . Medication Refill  . Hypertension    Headaches  . Hyperlipidemia    States she has had a tension headache taking Crestor  . Allergic Rhinitis   . Osteopenia    HPI  HTN: taking medication, bp is high again, we will adjust medication from losartan 100 to valsartan hctz. No chest pain or palpitation. Denies SOB or decrease in exercise tolerance.   Hyperlipidemia: she states used to have headache with Crestor, she was switched simvastatin but LDL went up, she is back on Crestor 10 mg since January 2019 and labs done in January showed a drop of LDL from 148 to 104, she has noticed again  some nuchal discomfort and we will try changing to pravastatin and monitor   Osteopenia: low FRAX score, on high calcium diet, taking supplements and never had a fracture Reviewed bone density with patient   Perennial allergic rhinitis: she uses flonase prn for nasal congestion, and sometimes post-nasal drainage that causes a cough  Stress: she has not been working because of risk of COVID-19 and her husband has COPD. She moved from a house to an apartment , not gardening as much, not as active as she used to be.    Patient Active Problem List   Diagnosis Date Noted  . Perennial allergic rhinitis with seasonal variation 11/09/2017  . Osteopenia after menopause 11/09/2017  . Post-menopausal 09/06/2016  . Hyperlipidemia 06/19/2016  . Essential hypertension 06/04/2015    Past Surgical History:  Procedure Laterality Date  . COLONOSCOPY WITH PROPOFOL N/A 08/19/2018   Procedure: COLONOSCOPY WITH PROPOFOL;  Surgeon: Manya Silvas, MD;  Location: [facility] ENDOSCOPY;  Service: Endoscopy;  Laterality: N/A;  . TONSILLECTOMY      Family History  Problem Relation Age of Onset  . Fibromyalgia Mother   . Heart Problems Mother   .  Dementia Father   . Heart Problems Father   . Heart disease Father   . Breast cancer Maternal Grandmother   . Cancer Maternal Grandmother   . Cancer Maternal Aunt   . Breast cancer Maternal Aunt     Social History   Socioeconomic History  . Marital status: Married    Spouse name: Jenny Reichmann  . Number of children: 1  . Years of education: Not on file  . Highest education level: Bachelor's degree (e.g., BA, AB, BS)  Occupational History  . Not on file  Social Needs  . Financial resource strain: Not hard at all  . Food insecurity:    Worry: Never true    Inability: Never true  . Transportation needs:    Medical: No    Non-medical: No  Tobacco Use  . Smoking status: Never Smoker  . Smokeless tobacco: Never Used  Substance and Sexual Activity  . Alcohol use: Yes    Alcohol/week: 1.0 standard drinks    Types: 1 Glasses of wine per week    Comment: nightly  . Drug use: No  . Sexual activity: Not Currently    Partners: Male    Birth control/protection: Post-menopausal  Lifestyle  . Physical activity:    Days per week: 4 days    Minutes per session: 40 min  . Stress: Not at all  Relationships  . Social connections:    Talks on phone: Patient refused  Gets together: Patient refused    Attends religious service: Patient refused    Active member of club or organization: Patient refused    Attends meetings of clubs or organizations: Patient refused    Relationship status: Married  . Intimate partner violence:    Fear of current or ex partner: No    Emotionally abused: No    Physically abused: No    Forced sexual activity: No  Other Topics Concern  . Not on file  Social History Narrative  . Not on file     Current Outpatient Medications:  .  aspirin 81 MG tablet, Take 1 tablet (81 mg total) by mouth daily., Disp: 30 tablet, Rfl: 12 .  Calcium Carbonate-Vitamin D (CALCIUM-VITAMIN D) 500-200 MG-UNIT per tablet, Take 1 tablet by mouth daily., Disp: , Rfl:  .  Calcium  Polycarbophil (FIBER-CAPS PO), Take 1 capsule by mouth as needed (Constipation)., Disp: , Rfl:  .  loratadine (CLARITIN) 10 MG tablet, TAKE ONE TABLET BY MOUTH DAILY, Disp: 90 tablet, Rfl: 0 .  fluticasone (FLONASE) 50 MCG/ACT nasal spray, Place 2 sprays into both nostrils daily., Disp: 16 g, Rfl: 6 .  pravastatin (PRAVACHOL) 40 MG tablet, Take 1 tablet (40 mg total) by mouth daily., Disp: 90 tablet, Rfl: 1 .  valsartan-hydrochlorothiazide (DIOVAN-HCT) 160-12.5 MG tablet, Take 1 tablet by mouth daily., Disp: 90 tablet, Rfl: 0  No Known Allergies  I personally reviewed active problem list, medication list, allergies, family history, social history with the patient/caregiver today.   ROS  Constitutional: Negative for fever or weight change.  Respiratory: Negative for cough and shortness of breath.   Cardiovascular: Negative for chest pain or palpitations.  Gastrointestinal: Negative for abdominal pain, no bowel changes.  Musculoskeletal: Negative for gait problem or joint swelling.  Skin: Negative for rash.  Neurological: Negative for dizziness or headache.  No other specific complaints in a complete review of systems (except as listed in HPI above).   Objective  Vitals:   12/25/18 0843 12/25/18 0903  BP: (!) 182/94 (!) 152/78  Pulse: 78   Resp: 16   Temp: 98 F (36.7 C)   TempSrc: Oral   SpO2: 98%   Weight: 170 lb 3.2 oz (77.2 kg)   Height: 5\' 5"  (1.651 m)     Body mass index is 28.32 kg/m.  Physical Exam  Constitutional: Patient appears well-developed and well-nourished.  No distress.  HEENT: head atraumatic, normocephalic, pupils equal and reactive to light, neck supple, throat within normal limits Cardiovascular: Normal rate, regular rhythm and normal heart sounds.  No murmur heard. No BLE edema. Pulmonary/Chest: Effort normal and breath sounds normal. No respiratory distress. Abdominal: Soft.  There is no tenderness. Psychiatric: Patient has a normal mood and  affect. behavior is normal. Judgment and thought content normal.    PHQ2/9: Depression screen [facility] 2/9 12/25/2018 05/10/2018 11/09/2017 11/09/2017 11/09/2017  Decreased Interest 0 0 0 0 0  Down, Depressed, Hopeless 1 0 0 0 0  PHQ - 2 Score 1 0 0 0 0  Altered sleeping 1 1 0 1 -  Tired, decreased energy 0 0 0 0 -  Change in appetite 1 0 0 0 -  Feeling bad or failure about yourself  0 0 0 0 -  Trouble concentrating 0 0 0 0 -  Moving slowly or fidgety/restless 0 0 0 0 -  Suicidal thoughts 0 0 0 0 -  PHQ-9 Score 3 1 0 1 -  Difficult doing work/chores Not difficult at  all Not difficult at all Not difficult at all Not difficult at all -    phq 9 is positive   Fall Risk: Fall Risk  12/25/2018 05/10/2018 11/09/2017 11/09/2017 08/10/2017  Falls in the past year? 0 No No No No  Number falls in past yr: 0 - - - -  Injury with Fall? 0 - - - -  Risk for fall due to : - - Impaired vision - -  Risk for fall due to: Comment - - wears eyeglasses - -    Functional Status Survey: Is the patient deaf or have difficulty hearing?: No Does the patient have difficulty seeing, even when wearing glasses/contacts?: No Does the patient have difficulty concentrating, remembering, or making decisions?: No Does the patient have difficulty walking or climbing stairs?: No Does the patient have difficulty dressing or bathing?: No Does the patient have difficulty doing errands alone such as visiting a doctor's office or shopping?: No    Assessment & Plan  1. Essential hypertension  - COMPLETE METABOLIC PANEL WITH GFR - CBC with Differential/Platelet - valsartan-hydrochlorothiazide (DIOVAN-HCT) 160-12.5 MG tablet; Take 1 tablet by mouth daily.  Dispense: 90 tablet; Refill: 0  2. Perennial allergic rhinitis with seasonal variation  - fluticasone (FLONASE) 50 MCG/ACT nasal spray; Place 2 sprays into both nostrils daily.  Dispense: 16 g; Refill: 6  3. Pure hypercholesterolemia  - Lipid panel - pravastatin (PRAVACHOL)  40 MG tablet; Take 1 tablet (40 mg total) by mouth daily.  Dispense: 90 tablet; Refill: 1  4. Other insomnia   5. Osteopenia after menopause  - VITAMIN D 25 Hydroxy (Vit-D Deficiency, Fractures)  6. Hyperglycemia  - Hemoglobin A1c  7. Stress

## 2018-12-26 ENCOUNTER — Telehealth: Payer: Self-pay | Admitting: Family Medicine

## 2018-12-26 ENCOUNTER — Encounter: Payer: Self-pay | Admitting: Family Medicine

## 2018-12-26 LAB — COMPLETE METABOLIC PANEL WITH GFR
AG Ratio: 1.8 (calc) (ref 1.0–2.5)
ALT: 16 U/L (ref 6–29)
AST: 20 U/L (ref 10–35)
Albumin: 4.6 g/dL (ref 3.6–5.1)
Alkaline phosphatase (APISO): 67 U/L (ref 37–153)
BUN: 12 mg/dL (ref 7–25)
CO2: 26 mmol/L (ref 20–32)
Calcium: 10.2 mg/dL (ref 8.6–10.4)
Chloride: 102 mmol/L (ref 98–110)
Creat: 0.78 mg/dL (ref 0.60–0.93)
GFR, Est African American: 88 mL/min/{1.73_m2} (ref >=60)
GFR, Est Non African American: 76 mL/min/{1.73_m2} (ref >=60)
Globulin: 2.6 g/dL (calc) (ref 1.9–3.7)
Glucose, Bld: 96 mg/dL (ref 65–99)
Potassium: 4.3 mmol/L (ref 3.5–5.3)
Sodium: 139 mmol/L (ref 135–146)
Total Bilirubin: 0.5 mg/dL (ref 0.2–1.2)
Total Protein: 7.2 g/dL (ref 6.1–8.1)

## 2018-12-26 LAB — CBC WITH DIFFERENTIAL/PLATELET
Absolute Monocytes: 352 cells/uL (ref 200–950)
Basophils Absolute: 28 cells/uL (ref 0–200)
Basophils Relative: 0.5 %
Eosinophils Absolute: 50 cells/uL (ref 15–500)
Eosinophils Relative: 0.9 %
HCT: 43.2 % (ref 35.0–45.0)
Hemoglobin: 14.5 g/dL (ref 11.7–15.5)
Lymphs Abs: 1584 cells/uL (ref 850–3900)
MCH: 28.4 pg (ref 27.0–33.0)
MCHC: 33.6 g/dL (ref 32.0–36.0)
MCV: 84.7 fL (ref 80.0–100.0)
MPV: 9.3 fL (ref 7.5–12.5)
Monocytes Relative: 6.4 %
Neutro Abs: 3487 cells/uL (ref 1500–7800)
Neutrophils Relative %: 63.4 %
Platelets: 158 10*3/uL (ref 140–400)
RBC: 5.1 10*6/uL (ref 3.80–5.10)
RDW: 12.6 % (ref 11.0–15.0)
Total Lymphocyte: 28.8 %
WBC: 5.5 10*3/uL (ref 3.8–10.8)

## 2018-12-26 LAB — LIPID PANEL
Cholesterol: 249 mg/dL — ABNORMAL HIGH (ref <200)
HDL: 52 mg/dL (ref >=50)
LDL Cholesterol (Calc): 163 mg/dL (calc) — ABNORMAL HIGH
Non-HDL Cholesterol (Calc): 197 mg/dL (calc) — ABNORMAL HIGH (ref <130)
Total CHOL/HDL Ratio: 4.8 (calc) (ref <5.0)
Triglycerides: 186 mg/dL — ABNORMAL HIGH (ref <150)

## 2018-12-26 LAB — VITAMIN D 25 HYDROXY (VIT D DEFICIENCY, FRACTURES): Vit D, 25-Hydroxy: 25 ng/mL — ABNORMAL LOW (ref 30–100)

## 2018-12-26 LAB — HEMOGLOBIN A1C
Hgb A1c MFr Bld: 5.1 % of total Hgb (ref <5.7)
Mean Plasma Glucose: 100 (calc)
eAG (mmol/L): 5.5 (calc)

## 2018-12-26 NOTE — Telephone Encounter (Signed)
Patient notified that the diuretic is combinate with the Valsartan medication.

## 2018-12-26 NOTE — Telephone Encounter (Unsigned)
Copied from [place] [id number]. Topic: General - Other >> Dec 26, 2018 10:49 AM Celene Kras A wrote: Reason for CRM: Pt called stating her PCP was supposed to be send in a dieretic to the pharmacy. Please advise.  [place], [place] [place] [address] Phone: [phone] Fax: [phone] Not a 24 hour pharmacy; exact hours not known.

## 2019-03-21 ENCOUNTER — Other Ambulatory Visit: Payer: Self-pay | Admitting: Family Medicine

## 2019-03-21 DIAGNOSIS — I10 Essential (primary) hypertension: Secondary | ICD-10-CM

## 2019-04-01 ENCOUNTER — Ambulatory Visit (INDEPENDENT_AMBULATORY_CARE_PROVIDER_SITE_OTHER): Payer: Medicare Other | Admitting: Family Medicine

## 2019-04-01 ENCOUNTER — Encounter: Payer: Self-pay | Admitting: Family Medicine

## 2019-04-01 ENCOUNTER — Other Ambulatory Visit: Payer: Self-pay

## 2019-04-01 VITALS — BP 158/62 | HR 80 | Temp 97.1°F | Resp 16 | Ht 65.0 in | Wt 174.0 lb

## 2019-04-01 DIAGNOSIS — E7800 Pure hypercholesterolemia, unspecified: Secondary | ICD-10-CM | POA: Diagnosis not present

## 2019-04-01 DIAGNOSIS — M810 Age-related osteoporosis without current pathological fracture: Secondary | ICD-10-CM

## 2019-04-01 DIAGNOSIS — G4709 Other insomnia: Secondary | ICD-10-CM

## 2019-04-01 DIAGNOSIS — M8580 Other specified disorders of bone density and structure, unspecified site: Secondary | ICD-10-CM

## 2019-04-01 DIAGNOSIS — Z780 Asymptomatic menopausal state: Secondary | ICD-10-CM

## 2019-04-01 DIAGNOSIS — J3089 Other allergic rhinitis: Secondary | ICD-10-CM | POA: Diagnosis not present

## 2019-04-01 DIAGNOSIS — J302 Other seasonal allergic rhinitis: Secondary | ICD-10-CM

## 2019-04-01 DIAGNOSIS — M542 Cervicalgia: Secondary | ICD-10-CM

## 2019-04-01 DIAGNOSIS — I10 Essential (primary) hypertension: Secondary | ICD-10-CM

## 2019-04-01 MED ORDER — AMLODIPINE BESYLATE 2.5 MG PO TABS: 2.5000 mg | ORAL_TABLET | Freq: Every evening | ORAL | 0 refills | Status: DC

## 2019-04-01 MED ORDER — ROSUVASTATIN CALCIUM 10 MG PO TABS: 10.0000 mg | ORAL_TABLET | Freq: Every day | ORAL | 0 refills | Status: DC

## 2019-04-01 MED ORDER — BACLOFEN 20 MG PO TABS: 20.0000 mg | ORAL_TABLET | Freq: Every day | ORAL | 0 refills | Status: DC

## 2019-04-01 MED ORDER — VALSARTAN-HYDROCHLOROTHIAZIDE 160-12.5 MG PO TABS: 1.0000 | ORAL_TABLET | Freq: Every day | ORAL | 0 refills | Status: DC

## 2019-04-01 NOTE — Progress Notes (Signed)
Name: Hannah Lindsey   MRN: [id number]    DOB: 26-Nov-1945   Date:04/01/2019       Progress Note  Subjective  Chief Complaint  Chief Complaint  Patient presents with  . Medication Refill  . Hypertension  . Hyperlipidemia  . Osteopenia  . Allergic Rhinitis     HPI  HTN: taking medication,bp is high again, we changed from losartan to diovan hctz 4 months ago and bp is still elevated, she states bp seems higher in the am's No chest pain or palpitation. Denies SOB or decrease in exercise tolerance. We will add norvasc at night and return in 6 weeks   Hyperlipidemia: she states used to have headache with Crestor, she was switched simvastatin but LDL went up, she is back on Crestor 10 mg since January 2019 and labs done in January showed a drop of LDL from 148 to 104, she has noticed again, we changed to pravastatin, but she states she wants to go back to Crestor since she thinks unrelated and wants to try it again   Osteopenia: low FRAX score, on high calcium diet, taking supplements and never had a fracture Stable   Perennial allergic rhinitis: she uses flonase prn for nasal congestion, and sometimes post-nasal drainage that causes a cough, unchanged   Stress: she has not been working because of risk of COVID-19 and her husband has COPD. She moved from a house to an apartment , she has been trying to do some gardening in her neighborhood, she has been walking her dog 4-5 times a day and it helps with her anxiety.   Patient Active Problem List   Diagnosis Date Noted  . Perennial allergic rhinitis with seasonal variation 11/09/2017  . Osteopenia after menopause 11/09/2017  . Post-menopausal 09/06/2016  . Hyperlipidemia 06/19/2016  . Essential hypertension 06/04/2015    Past Surgical History:  Procedure Laterality Date  . COLONOSCOPY WITH PROPOFOL N/A 08/19/2018   Procedure: COLONOSCOPY WITH PROPOFOL;  Surgeon: Manya Silvas, MD;  Location: [facility] ENDOSCOPY;  Service:  Endoscopy;  Laterality: N/A;  . TONSILLECTOMY      Family History  Problem Relation Age of Onset  . Fibromyalgia Mother   . Heart Problems Mother   . Dementia Father   . Heart Problems Father   . Heart disease Father   . Breast cancer Maternal Grandmother   . Cancer Maternal Grandmother   . Cancer Maternal Aunt   . Breast cancer Maternal Aunt     Social History   Socioeconomic History  . Marital status: Married    Spouse name: Jenny Reichmann  . Number of children: 1  . Years of education: Not on file  . Highest education level: Bachelor's degree (e.g., BA, AB, BS)  Occupational History  . Not on file  Social Needs  . Financial resource strain: Not hard at all  . Food insecurity    Worry: Never true    Inability: Never true  . Transportation needs    Medical: No    Non-medical: No  Tobacco Use  . Smoking status: Never Smoker  . Smokeless tobacco: Never Used  Substance and Sexual Activity  . Alcohol use: Yes    Alcohol/week: 1.0 standard drinks    Types: 1 Glasses of wine per week    Comment: nightly  . Drug use: No  . Sexual activity: Not Currently    Partners: Male    Birth control/protection: Post-menopausal  Lifestyle  . Physical activity    Days per week:  4 days    Minutes per session: 40 min  . Stress: Not at all  Relationships  . Social Herbalist on phone: Patient refused    Gets together: Patient refused    Attends religious service: Patient refused    Active member of club or organization: Patient refused    Attends meetings of clubs or organizations: Patient refused    Relationship status: Married  . Intimate partner violence    Fear of current or ex partner: No    Emotionally abused: No    Physically abused: No    Forced sexual activity: No  Other Topics Concern  . Not on file  Social History Narrative  . Not on file     Current Outpatient Medications:  .  aspirin 81 MG tablet, Take 1 tablet (81 mg total) by mouth daily., Disp: 30  tablet, Rfl: 12 .  Calcium Carbonate-Vitamin D (CALCIUM-VITAMIN D) 500-200 MG-UNIT per tablet, Take 1 tablet by mouth daily., Disp: , Rfl:  .  Calcium Polycarbophil (FIBER-CAPS PO), Take 1 capsule by mouth as needed (Constipation)., Disp: , Rfl:  .  fluticasone (FLONASE) 50 MCG/ACT nasal spray, Place 2 sprays into both nostrils daily., Disp: 16 g, Rfl: 6 .  loratadine (CLARITIN) 10 MG tablet, TAKE ONE TABLET BY MOUTH DAILY, Disp: 90 tablet, Rfl: 0 .  pravastatin (PRAVACHOL) 40 MG tablet, Take 1 tablet (40 mg total) by mouth daily., Disp: 90 tablet, Rfl: 1 .  valsartan-hydrochlorothiazide (DIOVAN-HCT) 160-12.5 MG tablet, TAKE ONE TABLET BY MOUTH DAILY, Disp: 60 tablet, Rfl: 0  No Known Allergies  I personally reviewed active problem list, surgical, family, social history and also health maintenance  with the patient/caregiver today.   ROS  Constitutional: Negative for fever or weight change.  Respiratory: Negative for cough and shortness of breath.   Cardiovascular: Negative for chest pain or palpitations.  Gastrointestinal: Negative for abdominal pain, no bowel changes.  Musculoskeletal: Negative for gait problem or joint swelling.  Skin: Negative for rash.  Neurological: Negative for dizziness or headache.  No other specific complaints in a complete review of systems (except as listed in HPI above).  Objective  Vitals:   04/01/19 0934  BP: (!) 158/62  Pulse: 80  Resp: 16  Temp: (!) 97.1 F (36.2 C)  TempSrc: Temporal  SpO2: 99%  Weight: 174 lb (78.9 kg)  Height: 5\' 5"  (1.651 m)    Body mass index is 28.96 kg/m.  Physical Exam  Constitutional: Patient appears well-developed and well-nourished. Overweight.  No distress.  HEENT: head atraumatic, normocephalic, pupils equal and reactive to light Cardiovascular: Normal rate, regular rhythm and normal heart sounds.  No murmur heard. No BLE edema. Pulmonary/Chest: Effort normal and breath sounds normal. No respiratory  distress. Abdominal: Soft.  There is no tenderness. Psychiatric: Patient has a normal mood and affect. behavior is normal. Judgment and thought content normal. Muscular Skeletal: pain during palpation of posterior neck and tense muscles on trapezium muscle   PHQ2/9: Depression screen [facility] 2/9 04/01/2019 12/25/2018 05/10/2018 11/09/2017 11/09/2017  Decreased Interest 0 0 0 0 0  Down, Depressed, Hopeless 0 1 0 0 0  PHQ - 2 Score 0 1 0 0 0  Altered sleeping 1 1 1  0 1  Tired, decreased energy 0 0 0 0 0  Change in appetite 0 1 0 0 0  Feeling bad or failure about yourself  0 0 0 0 0  Trouble concentrating 0 0 0 0 0  Moving slowly  or fidgety/restless 0 0 0 0 0  Suicidal thoughts 0 0 0 0 0  PHQ-9 Score 1 3 1  0 1  Difficult doing work/chores Not difficult at all Not difficult at all Not difficult at all Not difficult at all Not difficult at all    phq 9 is negative   Fall Risk: Fall Risk  04/01/2019 12/25/2018 05/10/2018 11/09/2017 11/09/2017  Falls in the past year? 0 0 No No No  Number falls in past yr: 0 0 - - -  Injury with Fall? 0 0 - - -  Risk for fall due to : - - - Impaired vision -  Risk for fall due to: Comment - - - wears eyeglasses -     Functional Status Survey: Is the patient deaf or have difficulty hearing?: No Does the patient have difficulty seeing, even when wearing glasses/contacts?: No Does the patient have difficulty concentrating, remembering, or making decisions?: No Does the patient have difficulty walking or climbing stairs?: No Does the patient have difficulty dressing or bathing?: No Does the patient have difficulty doing errands alone such as visiting a doctor's office or shopping?: No    Assessment & Plan  1. Essential hypertension  - amLODipine (NORVASC) 2.5 MG tablet; Take 1 tablet (2.5 mg total) by mouth every evening.  Dispense: 90 tablet; Refill: 0 - valsartan-hydrochlorothiazide (DIOVAN-HCT) 160-12.5 MG tablet; Take 1 tablet by mouth daily.  Dispense: 90  tablet; Refill: 0  2. Pure hypercholesterolemia  - rosuvastatin (CRESTOR) 10 MG tablet; Take 1 tablet (10 mg total) by mouth daily.  Dispense: 90 tablet; Refill: 0  3. Osteopenia after menopause  Continue vitamin D and physical activity   4. Perennial allergic rhinitis with seasonal variation   5. Other insomnia  Taking melatonin   6. Neck pain  - baclofen (LIORESAL) 20 MG tablet; Take 1 tablet (20 mg total) by mouth at bedtime.  Dispense: 90 each; Refill: 0

## 2019-05-14 ENCOUNTER — Encounter: Payer: Self-pay | Admitting: Family Medicine

## 2019-05-14 ENCOUNTER — Ambulatory Visit (INDEPENDENT_AMBULATORY_CARE_PROVIDER_SITE_OTHER): Payer: Medicare Other | Admitting: Family Medicine

## 2019-05-14 ENCOUNTER — Other Ambulatory Visit: Payer: Self-pay

## 2019-05-14 VITALS — BP 150/80 | HR 72 | Temp 96.9°F | Resp 16 | Ht 65.0 in | Wt 171.8 lb

## 2019-05-14 DIAGNOSIS — Z23 Encounter for immunization: Secondary | ICD-10-CM

## 2019-05-14 DIAGNOSIS — E7800 Pure hypercholesterolemia, unspecified: Secondary | ICD-10-CM | POA: Diagnosis not present

## 2019-05-14 DIAGNOSIS — G4709 Other insomnia: Secondary | ICD-10-CM | POA: Diagnosis not present

## 2019-05-14 DIAGNOSIS — M542 Cervicalgia: Secondary | ICD-10-CM

## 2019-05-14 DIAGNOSIS — I10 Essential (primary) hypertension: Secondary | ICD-10-CM | POA: Diagnosis not present

## 2019-05-14 MED ORDER — CYCLOBENZAPRINE HCL 10 MG PO TABS: 10.0000 mg | ORAL_TABLET | Freq: Every day | ORAL | 0 refills | Status: DC

## 2019-05-14 MED ORDER — AMLODIPINE BESYLATE 2.5 MG PO TABS: 5.0000 mg | ORAL_TABLET | Freq: Every evening | ORAL | 0 refills | Status: DC

## 2019-05-14 NOTE — Progress Notes (Signed)
Name: Hannah Lindsey   MRN: [id number]    DOB: [birth year]   Date:05/14/2019       Progress Note  Subjective  Chief Complaint  Chief Complaint  Patient presents with  . Hypertension  . Hyperlipidemia    HPI  HTN: taking medication,bp is still high, we added norvasc on her last visit at 2.5 mg daily on top of valsartan hctz 160/12.5 mg. She denies chest pain or palpitation. We will adjust dose and if no control we will refer her to nephrologist   Hyperlipidemia: she is now on Crestor since August and no side effects this time around, we will recheck labs today.   Neck pain: she has stiffness and pain constantly , aching like, normal rom, pain seems to make her feel foggy and not sure if that or secondary to BP, we will change to flexeril to see if it will help her sleep also    Patient Active Problem List   Diagnosis Date Noted  . Perennial allergic rhinitis with seasonal variation 11/09/2017  . Osteopenia after menopause 11/09/2017  . Post-menopausal 09/06/2016  . Hyperlipidemia 06/19/2016  . Essential hypertension 06/04/2015    Past Surgical History:  Procedure Laterality Date  . COLONOSCOPY WITH PROPOFOL N/A 08/19/2018   Procedure: COLONOSCOPY WITH PROPOFOL;  Surgeon: Manya Silvas, MD;  Location: [facility] ENDOSCOPY;  Service: Endoscopy;  Laterality: N/A;  . TONSILLECTOMY      Family History  Problem Relation Age of Onset  . Fibromyalgia Mother   . Heart Problems Mother   . Dementia Father   . Heart Problems Father   . Heart disease Father   . Breast cancer Maternal Grandmother   . Cancer Maternal Grandmother   . Cancer Maternal Aunt   . Breast cancer Maternal Aunt     Social History   Socioeconomic History  . Marital status: Married    Spouse name: Jenny Reichmann  . Number of children: 1  . Years of education: Not on file  . Highest education level: Bachelor's degree (e.g., BA, AB, BS)  Occupational History  . Not on file  Social Needs  . Financial resource  strain: Not hard at all  . Food insecurity    Worry: Never true    Inability: Never true  . Transportation needs    Medical: No    Non-medical: No  Tobacco Use  . Smoking status: Never Smoker  . Smokeless tobacco: Never Used  Substance and Sexual Activity  . Alcohol use: Yes    Alcohol/week: 1.0 standard drinks    Types: 1 Glasses of wine per week    Comment: nightly  . Drug use: No  . Sexual activity: Not Currently    Partners: Male    Birth control/protection: Post-menopausal  Lifestyle  . Physical activity    Days per week: 4 days    Minutes per session: 40 min  . Stress: Not at all  Relationships  . Social Herbalist on phone: Patient refused    Gets together: Patient refused    Attends religious service: Patient refused    Active member of club or organization: Patient refused    Attends meetings of clubs or organizations: Patient refused    Relationship status: Married  . Intimate partner violence    Fear of current or ex partner: No    Emotionally abused: No    Physically abused: No    Forced sexual activity: No  Other Topics Concern  . Not on file  Social History Narrative  . Not on file     Current Outpatient Medications:  .  amLODipine (NORVASC) 2.5 MG tablet, Take 2 tablets (5 mg total) by mouth every evening., Disp: 30 tablet, Rfl: 0 .  aspirin 81 MG tablet, Take 1 tablet (81 mg total) by mouth daily., Disp: 30 tablet, Rfl: 12 .  Calcium Carbonate-Vitamin D (CALCIUM-VITAMIN D) 500-200 MG-UNIT per tablet, Take 1 tablet by mouth daily., Disp: , Rfl:  .  Calcium Polycarbophil (FIBER-CAPS PO), Take 1 capsule by mouth as needed (Constipation)., Disp: , Rfl:  .  fluticasone (FLONASE) 50 MCG/ACT nasal spray, Place 2 sprays into both nostrils daily., Disp: 16 g, Rfl: 6 .  loratadine (CLARITIN) 10 MG tablet, TAKE ONE TABLET BY MOUTH DAILY, Disp: 90 tablet, Rfl: 0 .  rosuvastatin (CRESTOR) 10 MG tablet, Take 1 tablet (10 mg total) by mouth daily., Disp:  90 tablet, Rfl: 0 .  valsartan-hydrochlorothiazide (DIOVAN-HCT) 160-12.5 MG tablet, Take 1 tablet by mouth daily., Disp: 90 tablet, Rfl: 0 .  cyclobenzaprine (FLEXERIL) 10 MG tablet, Take 1 tablet (10 mg total) by mouth at bedtime., Disp: 90 tablet, Rfl: 0  No Known Allergies  I personally reviewed active problem list, medication list, allergies, family history, social history, health maintenance with the patient/caregiver today.   ROS  Constitutional: Negative for fever or weight change.  Respiratory: Negative for cough and shortness of breath.   Cardiovascular: Negative for chest pain or palpitations.  Gastrointestinal: Negative for abdominal pain, no bowel changes.  Musculoskeletal: Negative for gait problem or joint swelling.  Skin: Negative for rash.  Neurological: Negative for dizziness or headache.  No other specific complaints in a complete review of systems (except as listed in HPI above).  Objective  Vitals:   05/14/19 0853 05/14/19 0912  BP: (!) 160/80 (!) 150/80  Pulse: 72   Resp: 16   Temp: (!) 96.9 F (36.1 C)   TempSrc: Temporal   Weight: 171 lb 12.8 oz (77.9 kg)   Height: 5\' 5"  (1.651 m)     Body mass index is 28.59 kg/m.  Physical Exam  Constitutional: Patient appears well-developed and well-nourished.No distress.  HEENT: head atraumatic, normocephalic, pupils equal and reactive to light, neck has normal rom, but tender to palpation posteriorly  Cardiovascular: Normal rate, regular rhythm and normal heart sounds.  No murmur heard. No BLE edema. Pulmonary/Chest: Effort normal and breath sounds normal. No respiratory distress. Abdominal: Soft.  There is no tenderness. Psychiatric: Patient has a normal mood and affect. behavior is normal. Judgment and thought content normal.  PHQ2/9: Depression screen [facility] 2/9 05/14/2019 04/01/2019 12/25/2018 05/10/2018 11/09/2017  Decreased Interest 0 0 0 0 0  Down, Depressed, Hopeless 0 0 1 0 0  PHQ - 2 Score 0 0 1 0 0   Altered sleeping 0 1 1 1  0  Tired, decreased energy 0 0 0 0 0  Change in appetite 0 0 1 0 0  Feeling bad or failure about yourself  0 0 0 0 0  Trouble concentrating 0 0 0 0 0  Moving slowly or fidgety/restless 0 0 0 0 0  Suicidal thoughts 0 0 0 0 0  PHQ-9 Score 0 1 3 1  0  Difficult doing work/chores - Not difficult at all Not difficult at all Not difficult at all Not difficult at all    phq 9 is negative   Fall Risk: Fall Risk  05/14/2019 04/01/2019 12/25/2018 05/10/2018 11/09/2017  Falls in the past year? 0 0 0  No No  Number falls in past yr: 0 0 0 - -  Injury with Fall? 0 0 0 - -  Risk for fall due to : - - - - Impaired vision  Risk for fall due to: Comment - - - - wears eyeglasses     Functional Status Survey: Is the patient deaf or have difficulty hearing?: No Does the patient have difficulty seeing, even when wearing glasses/contacts?: No Does the patient have difficulty concentrating, remembering, or making decisions?: No Does the patient have difficulty dressing or bathing?: No Does the patient have difficulty doing errands alone such as visiting a doctor's office or shopping?: No    Assessment & Plan  1. Essential hypertension  - COMPLETE METABOLIC PANEL WITH GFR - amLODipine (NORVASC) 2.5 MG tablet; Take 2 tablets (5 mg total) by mouth every evening.  Dispense: 30 tablet; Refill: 0  2. Pure hypercholesterolemia  - Lipid panel - COMPLETE METABOLIC PANEL WITH GFR  3. Other insomnia  Sleeps with dog and wakes her up   4. Need for immunization against influenza  - Flu Vaccine QUAD High Dose(Fluad)  5. Neck pain  - cyclobenzaprine (FLEXERIL) 10 MG tablet; Take 1 tablet (10 mg total) by mouth at bedtime.  Dispense: 90 tablet; Refill: 0

## 2019-05-15 ENCOUNTER — Other Ambulatory Visit: Payer: Self-pay | Admitting: Family Medicine

## 2019-05-15 LAB — COMPLETE METABOLIC PANEL WITH GFR
AG Ratio: 2.1 (calc) (ref 1.0–2.5)
ALT: 23 U/L (ref 6–29)
AST: 25 U/L (ref 10–35)
Albumin: 5 g/dL (ref 3.6–5.1)
Alkaline phosphatase (APISO): 61 U/L (ref 37–153)
BUN: 13 mg/dL (ref 7–25)
CO2: 35 mmol/L — ABNORMAL HIGH (ref 20–32)
Calcium: 10.9 mg/dL — ABNORMAL HIGH (ref 8.6–10.4)
Chloride: 101 mmol/L (ref 98–110)
Creat: 0.82 mg/dL (ref 0.60–0.93)
GFR, Est African American: 83 mL/min/{1.73_m2} (ref >=60)
GFR, Est Non African American: 71 mL/min/{1.73_m2} (ref >=60)
Globulin: 2.4 g/dL (calc) (ref 1.9–3.7)
Glucose, Bld: 100 mg/dL — ABNORMAL HIGH (ref 65–99)
Potassium: 4.8 mmol/L (ref 3.5–5.3)
Sodium: 141 mmol/L (ref 135–146)
Total Bilirubin: 0.5 mg/dL (ref 0.2–1.2)
Total Protein: 7.4 g/dL (ref 6.1–8.1)

## 2019-05-15 LAB — LIPID PANEL
Cholesterol: 219 mg/dL — ABNORMAL HIGH (ref <200)
HDL: 50 mg/dL (ref >=50)
LDL Cholesterol (Calc): 130 mg/dL (calc) — ABNORMAL HIGH
Non-HDL Cholesterol (Calc): 169 mg/dL (calc) — ABNORMAL HIGH (ref <130)
Total CHOL/HDL Ratio: 4.4 (calc) (ref <5.0)
Triglycerides: 239 mg/dL — ABNORMAL HIGH (ref <150)

## 2019-05-16 ENCOUNTER — Other Ambulatory Visit: Payer: Self-pay | Admitting: Family Medicine

## 2019-05-19 ENCOUNTER — Other Ambulatory Visit: Payer: Self-pay | Admitting: Family Medicine

## 2019-05-19 DIAGNOSIS — I10 Essential (primary) hypertension: Secondary | ICD-10-CM

## 2019-05-21 ENCOUNTER — Ambulatory Visit: Payer: Medicare Other | Admitting: Family Medicine

## 2019-05-21 ENCOUNTER — Other Ambulatory Visit: Payer: Self-pay

## 2019-05-21 ENCOUNTER — Encounter: Payer: Self-pay | Admitting: Family Medicine

## 2019-05-21 VITALS — BP 150/78 | HR 97 | Temp 96.9°F | Resp 16 | Ht 65.0 in | Wt 169.8 lb

## 2019-05-21 DIAGNOSIS — R519 Headache, unspecified: Secondary | ICD-10-CM | POA: Diagnosis not present

## 2019-05-21 MED ORDER — NORTRIPTYLINE HCL 10 MG PO CAPS: 10.0000 mg | ORAL_CAPSULE | Freq: Every day | ORAL | 0 refills | Status: DC

## 2019-05-21 NOTE — Progress Notes (Signed)
Name: Hannah Lindsey   MRN: [id number]    DOB: Dec 15, 1945   Date:05/21/2019       Progress Note  Subjective  Chief Complaint  Chief Complaint  Patient presents with  . Headache    Has moved to the top of her head and unable to sleep at night.    HPI  Headache: she states new onset headache, she states over the past two weeks she has noticed a throbbing headache on top of her head and towards left parietal area, but sometimes has tingling sensation that radiates to frontal area or occipital area. Pain is mild 1-2 but constant, resolves at night, pain is not present when she first wakes up, but after walking her dog is when she notices the pain. No dizziness , visual changes, no nausea or vomiting, no weakness or focal deficit. She states headache started right before we adjusted bp medication , she just got concerned after her last visit with me. She would like to get a scan, explained no red flags, we will try  Nortriptyline first.   HTN: we just adjusted her dose, explained that she may have a component of white coat hypertension and advised her to get a home monitor, otherwise try increase diovan to one pill and a half a few days prior to her next visit and we will recheck it during her visit.  Hypercalcemia: she is taking otc calcium, discussed changing to a high calcium diet and recheck labs next week    Patient Active Problem List   Diagnosis Date Noted  . Perennial allergic rhinitis with seasonal variation 11/09/2017  . Osteopenia after menopause 11/09/2017  . Post-menopausal 09/06/2016  . Hyperlipidemia 06/19/2016  . Essential hypertension 06/04/2015    Past Surgical History:  Procedure Laterality Date  . COLONOSCOPY WITH PROPOFOL N/A 08/19/2018   Procedure: COLONOSCOPY WITH PROPOFOL;  Surgeon: Manya Silvas, MD;  Location: [facility] ENDOSCOPY;  Service: Endoscopy;  Laterality: N/A;  . TONSILLECTOMY      Family History  Problem Relation Age of Onset  . Fibromyalgia  Mother   . Heart Problems Mother   . Dementia Father   . Heart Problems Father   . Heart disease Father   . Breast cancer Maternal Grandmother   . Cancer Maternal Grandmother   . Cancer Maternal Aunt   . Breast cancer Maternal Aunt     Social History   Socioeconomic History  . Marital status: Married    Spouse name: Jenny Reichmann  . Number of children: 1  . Years of education: Not on file  . Highest education level: Bachelor's degree (e.g., BA, AB, BS)  Occupational History  . Not on file  Social Needs  . Financial resource strain: Not hard at all  . Food insecurity    Worry: Never true    Inability: Never true  . Transportation needs    Medical: No    Non-medical: No  Tobacco Use  . Smoking status: Never Smoker  . Smokeless tobacco: Never Used  Substance and Sexual Activity  . Alcohol use: Yes    Alcohol/week: 1.0 standard drinks    Types: 1 Glasses of wine per week    Comment: nightly  . Drug use: No  . Sexual activity: Not Currently    Partners: Male    Birth control/protection: Post-menopausal  Lifestyle  . Physical activity    Days per week: 4 days    Minutes per session: 40 min  . Stress: Not at all  Relationships  .  Social Herbalist on phone: Patient refused    Gets together: Patient refused    Attends religious service: Patient refused    Active member of club or organization: Patient refused    Attends meetings of clubs or organizations: Patient refused    Relationship status: Married  . Intimate partner violence    Fear of current or ex partner: No    Emotionally abused: No    Physically abused: No    Forced sexual activity: No  Other Topics Concern  . Not on file  Social History Narrative  . Not on file     Current Outpatient Medications:  .  amLODipine (NORVASC) 2.5 MG tablet, Take 2 tablets (5 mg total) by mouth every evening., Disp: 30 tablet, Rfl: 0 .  aspirin 81 MG tablet, Take 1 tablet (81 mg total) by mouth daily., Disp: 30  tablet, Rfl: 12 .  Calcium Carbonate-Vitamin D (CALCIUM-VITAMIN D) 500-200 MG-UNIT per tablet, Take 1 tablet by mouth daily., Disp: , Rfl:  .  Calcium Polycarbophil (FIBER-CAPS PO), Take 1 capsule by mouth as needed (Constipation)., Disp: , Rfl:  .  cyclobenzaprine (FLEXERIL) 10 MG tablet, Take 1 tablet (10 mg total) by mouth at bedtime., Disp: 90 tablet, Rfl: 0 .  fluticasone (FLONASE) 50 MCG/ACT nasal spray, Place 2 sprays into both nostrils daily., Disp: 16 g, Rfl: 6 .  loratadine (CLARITIN) 10 MG tablet, TAKE ONE TABLET BY MOUTH DAILY, Disp: 90 tablet, Rfl: 0 .  rosuvastatin (CRESTOR) 10 MG tablet, Take 1 tablet (10 mg total) by mouth daily., Disp: 90 tablet, Rfl: 0 .  valsartan-hydrochlorothiazide (DIOVAN-HCT) 160-12.5 MG tablet, TAKE ONE TABLET BY MOUTH DAILY, Disp: 60 tablet, Rfl: 0  No Known Allergies  I personally reviewed active problem list, medication list, allergies, family history, social history, health maintenance with the patient/caregiver today.   ROS  Ten systems reviewed and is negative except as mentioned in HPI   Objective  Vitals:   05/21/19 0937  BP: (!) 162/94  Pulse: 97  Resp: 16  Temp: (!) 96.9 F (36.1 C)  TempSrc: Temporal  SpO2: 98%  Weight: 169 lb 12.8 oz (77 kg)  Height: 5\' 5"  (1.651 m)    Body mass index is 28.26 kg/m.  Physical Exam  Constitutional: Patient appears well-developed and well-nourished. No distress.  HEENT: head atraumatic, normocephalic, pupils equal and reactive to light, ears normal TM bilaterally , neck supple, throat within normal limits Cardiovascular: Normal rate, regular rhythm and normal heart sounds.  No murmur heard. No BLE edema. Pulmonary/Chest: Effort normal and breath sounds normal. No respiratory distress. Abdominal: Soft.  There is no tenderness. Psychiatric: Patient has a normal mood and affect. behavior is normal. Judgment and thought content normal. Neurological : normal cranial nerves, normal reflexes,  romberg negative, normal strength, balance and gait   Recent Results (from the past 2160 hour(s))  Lipid panel     Status: Abnormal   Collection Time: 05/14/19 12:00 AM  Result Value Ref Range   Cholesterol 219 (H) <200 mg/dL   HDL 50 > OR = 50 mg/dL   Triglycerides 239 (H) <150 mg/dL    Comment: . If a non-fasting specimen was collected, consider repeat triglyceride testing on a fasting specimen if clinically indicated.  Yates Decamp et al. J. of Clin. Lipidol. [id number]. Marland Kitchen    LDL Cholesterol (Calc) 130 (H) mg/dL (calc)    Comment: Reference range: <100 . Desirable range <100 mg/dL for primary prevention;   <70 mg/dL  for patients with CHD or diabetic patients  with > or = 2 CHD risk factors. Marland Kitchen LDL-C is now calculated using the Martin-Hopkins  calculation, which is a validated novel method providing  better accuracy than the Friedewald equation in the  estimation of LDL-C.  Cresenciano Genre et al. Annamaria Helling. [id number]): 2061-2068  (http://education.[facility].com/faq/FAQ164)    Total CHOL/HDL Ratio 4.4 <5.0 (calc)   Non-HDL Cholesterol (Calc) 169 (H) <130 mg/dL (calc)    Comment: For patients with diabetes plus 1 major ASCVD risk  factor, treating to a non-HDL-C goal of <100 mg/dL  (LDL-C of <70 mg/dL) is considered a therapeutic  option.   COMPLETE METABOLIC PANEL WITH GFR     Status: Abnormal   Collection Time: 05/14/19 12:00 AM  Result Value Ref Range   Glucose, Bld 100 (H) 65 - 99 mg/dL    Comment: .            Fasting reference interval . For someone without known diabetes, a glucose value between 100 and 125 mg/dL is consistent with prediabetes and should be confirmed with a follow-up test. .    BUN 13 7 - 25 mg/dL   Creat 0.82 0.60 - 0.93 mg/dL    Comment: For patients >9 years of age, the reference limit for Creatinine is approximately 13% higher for people identified as African-American. .    GFR, Est Non African American 71 > OR = 60 mL/min/1.71m2    GFR, Est African American 83 > OR = 60 mL/min/1.50m2   BUN/Creatinine Ratio NOT APPLICABLE 6 - 22 (calc)   Sodium 141 135 - 146 mmol/L   Potassium 4.8 3.5 - 5.3 mmol/L   Chloride 101 98 - 110 mmol/L   CO2 35 (H) 20 - 32 mmol/L   Calcium 10.9 (H) 8.6 - 10.4 mg/dL   Total Protein 7.4 6.1 - 8.1 g/dL   Albumin 5.0 3.6 - 5.1 g/dL   Globulin 2.4 1.9 - 3.7 g/dL (calc)   AG Ratio 2.1 1.0 - 2.5 (calc)   Total Bilirubin 0.5 0.2 - 1.2 mg/dL   Alkaline phosphatase (APISO) 61 37 - 153 U/L   AST 25 10 - 35 U/L   ALT 23 6 - 29 U/L     PHQ2/9: Depression screen [facility] 2/9 05/21/2019 05/14/2019 04/01/2019 12/25/2018 05/10/2018  Decreased Interest 0 0 0 0 0  Down, Depressed, Hopeless 0 0 0 1 0  PHQ - 2 Score 0 0 0 1 0  Altered sleeping 0 0 1 1 1   Tired, decreased energy 0 0 0 0 0  Change in appetite 0 0 0 1 0  Feeling bad or failure about yourself  0 0 0 0 0  Trouble concentrating 0 0 0 0 0  Moving slowly or fidgety/restless 0 0 0 0 0  Suicidal thoughts 0 0 0 0 0  PHQ-9 Score 0 0 1 3 1   Difficult doing work/chores Not difficult at all - Not difficult at all Not difficult at all Not difficult at all  Some recent data might be hidden    phq 9 is negative   Fall Risk: Fall Risk  05/21/2019 05/14/2019 04/01/2019 12/25/2018 05/10/2018  Falls in the past year? 0 0 0 0 No  Number falls in past yr: 0 0 0 0 -  Injury with Fall? 0 0 0 0 -  Risk for fall due to : - - - - -  Risk for fall due to: Comment - - - - -  Assessment & Plan  1. Daily headache  - nortriptyline (PAMELOR) 10 MG capsule; Take 1 capsule (10 mg total) by mouth at bedtime.  Dispense: 30 capsule; Refill: 0

## 2019-05-28 ENCOUNTER — Ambulatory Visit (INDEPENDENT_AMBULATORY_CARE_PROVIDER_SITE_OTHER): Payer: Medicare Other

## 2019-05-28 ENCOUNTER — Other Ambulatory Visit: Payer: Self-pay

## 2019-05-28 VITALS — BP 140/80 | HR 86

## 2019-05-28 DIAGNOSIS — Z0130 Encounter for examination of blood pressure without abnormal findings: Secondary | ICD-10-CM

## 2019-05-28 NOTE — Progress Notes (Signed)
Patient is here for a blood pressure check. Patient denies chest pain, palpitations, shortness of breath or visual disturbances. At previous visit blood pressure was 150/78 with a heart rate of 97. Today during nurse visit first check blood pressure was 140/80 with a heart rate of 86. She does take blood pressure medications as prescribed with no missed doses.

## 2019-06-08 ENCOUNTER — Other Ambulatory Visit: Payer: Self-pay | Admitting: Family Medicine

## 2019-06-08 DIAGNOSIS — I10 Essential (primary) hypertension: Secondary | ICD-10-CM

## 2019-06-16 ENCOUNTER — Other Ambulatory Visit: Payer: Self-pay | Admitting: Family Medicine

## 2019-06-16 DIAGNOSIS — R519 Headache, unspecified: Secondary | ICD-10-CM

## 2019-06-20 ENCOUNTER — Other Ambulatory Visit: Payer: Self-pay | Admitting: Family Medicine

## 2019-06-20 DIAGNOSIS — E7800 Pure hypercholesterolemia, unspecified: Secondary | ICD-10-CM

## 2019-06-20 NOTE — Telephone Encounter (Signed)
Requested medication (s) are due for refill today: no  Requested medication (s) are on the active medication list: no  Last refill:  03/23/2019  Future visit scheduled: yes  Notes to clinic:  Medication was discontinued    Requested Prescriptions  Pending Prescriptions Disp Refills   pravastatin (PRAVACHOL) 40 MG tablet [Pharmacy Med Name: PRAVASTATIN SODIUM 40 MG TAB] 90 tablet 0    Sig: TAKE ONE TABLET BY MOUTH DAILY     Cardiovascular:  Antilipid - Statins Failed - 06/20/2019  6:20 AM      Failed - Total Cholesterol in normal range and within 360 days    Cholesterol, Total  Date Value Ref Range Status  04/19/2015 188 100 - 199 mg/dL Final   Cholesterol  Date Value Ref Range Status  05/14/2019 219 (H) <200 mg/dL Final         Failed - LDL in normal range and within 360 days    LDL Cholesterol (Calc)  Date Value Ref Range Status  05/14/2019 130 (H) mg/dL (calc) Final    Comment:    Reference range: <100 . Desirable range <100 mg/dL for primary prevention;   <70 mg/dL for patients with CHD or diabetic patients  with > or = 2 CHD risk factors. Marland Kitchen LDL-C is now calculated using the Martin-Hopkins  calculation, which is a validated novel method providing  better accuracy than the Friedewald equation in the  estimation of LDL-C.  Cresenciano Genre et al. Annamaria Helling. [id number]): 2061-2068  (http://education.[facility].com/faq/FAQ164)          Failed - Triglycerides in normal range and within 360 days    Triglycerides  Date Value Ref Range Status  05/14/2019 239 (H) <150 mg/dL Final    Comment:    . If a non-fasting specimen was collected, consider repeat triglyceride testing on a fasting specimen if clinically indicated.  Yates Decamp et al. J. of Clin. Lipidol. [id number]. Marland Kitchen          Passed - HDL in normal range and within 360 days    HDL  Date Value Ref Range Status  05/14/2019 50 > OR = 50 mg/dL Final  04/19/2015 51 >39 mg/dL Final    Comment:    According to  ATP-III Guidelines, HDL-C >59 mg/dL is considered a negative risk factor for CHD.          Passed - Patient is not pregnant      Passed - Valid encounter within last 12 months    Recent Outpatient Visits          1 month ago Daily headache   [facility] Steele Sizer, MD   1 month ago Essential hypertension   [facility] Steele Sizer, MD   2 months ago Essential hypertension   [facility] Steele Sizer, MD   5 months ago Essential hypertension   [facility] Steele Sizer, MD   1 year ago Essential hypertension   [facility] Steele Sizer, MD      Future Appointments            In 5 days Steele Sizer, MD [facility]

## 2019-06-25 ENCOUNTER — Other Ambulatory Visit: Payer: Self-pay

## 2019-06-25 ENCOUNTER — Ambulatory Visit (INDEPENDENT_AMBULATORY_CARE_PROVIDER_SITE_OTHER): Payer: Medicare Other | Admitting: Family Medicine

## 2019-06-25 ENCOUNTER — Encounter: Payer: Self-pay | Admitting: Family Medicine

## 2019-06-25 VITALS — BP 134/72 | HR 94 | Temp 97.3°F | Resp 16 | Ht 65.0 in | Wt 164.5 lb

## 2019-06-25 DIAGNOSIS — I10 Essential (primary) hypertension: Secondary | ICD-10-CM | POA: Diagnosis not present

## 2019-06-25 DIAGNOSIS — G4709 Other insomnia: Secondary | ICD-10-CM

## 2019-06-25 DIAGNOSIS — E7800 Pure hypercholesterolemia, unspecified: Secondary | ICD-10-CM

## 2019-06-25 DIAGNOSIS — Z780 Asymptomatic menopausal state: Secondary | ICD-10-CM

## 2019-06-25 DIAGNOSIS — R519 Headache, unspecified: Secondary | ICD-10-CM | POA: Diagnosis not present

## 2019-06-25 DIAGNOSIS — M8580 Other specified disorders of bone density and structure, unspecified site: Secondary | ICD-10-CM

## 2019-06-25 DIAGNOSIS — J3089 Other allergic rhinitis: Secondary | ICD-10-CM

## 2019-06-25 DIAGNOSIS — J302 Other seasonal allergic rhinitis: Secondary | ICD-10-CM

## 2019-06-25 MED ORDER — AMLODIPINE BESYLATE 10 MG PO TABS: 10.0000 mg | ORAL_TABLET | Freq: Every evening | ORAL | 1 refills | Status: DC

## 2019-06-25 MED ORDER — ROSUVASTATIN CALCIUM 10 MG PO TABS: 10.0000 mg | ORAL_TABLET | Freq: Every day | ORAL | 1 refills | Status: DC

## 2019-06-25 MED ORDER — VALSARTAN-HYDROCHLOROTHIAZIDE 320-12.5 MG PO TABS: 1.0000 | ORAL_TABLET | Freq: Every day | ORAL | 1 refills | Status: DC

## 2019-06-25 NOTE — Progress Notes (Signed)
Name: Hannah Lindsey   MRN: [id number]    DOB: 1946/01/19   Date:06/25/2019       Progress Note  Subjective  Chief Complaint  Chief Complaint  Patient presents with  . Allergic Rhinitis   . Hypertension  . Hyperlipidemia  . Constipation    She thinks that her combination of medications is making her constipated.    HPI  HTN: bp is finally at goal, she is on Norvasc 10 mg and Diovan 160/12.5 taking one and a half daily. We we will adjust dose of Diovan to 320/12.5 mg daily, no chest pain or palpitation.   Headache: still present, top of the head, intermittent, nortriptyline did not really help and caused constipation, she will stop it and decide what to do . She has increased fiber in her diet and we will monitor it.   Hypercalcemia: she stopped taking otc calcium discussed just following a high calcium diet  Hyperlipidemia: she is back on Crestor and we will recheck next visit, no myalgias at this time   Osteopenia: low FRAX score, on high calcium diet, taking supplements and never had a fractureStable   Perennial allergic rhinitis:she uses flonase prn for nasal congestion, and sometimes post-nasal drainage that causes a cough. Unchanged    Patient Active Problem List   Diagnosis Date Noted  . Perennial allergic rhinitis with seasonal variation 11/09/2017  . Osteopenia after menopause 11/09/2017  . Post-menopausal 09/06/2016  . Hyperlipidemia 06/19/2016  . Essential hypertension 06/04/2015    Past Surgical History:  Procedure Laterality Date  . COLONOSCOPY WITH PROPOFOL N/A 08/19/2018   Procedure: COLONOSCOPY WITH PROPOFOL;  Surgeon: Manya Silvas, MD;  Location: [facility] ENDOSCOPY;  Service: Endoscopy;  Laterality: N/A;  . TONSILLECTOMY      Family History  Problem Relation Age of Onset  . Fibromyalgia Mother   . Heart Problems Mother   . Dementia Father   . Heart Problems Father   . Heart disease Father   . Breast cancer Maternal Grandmother   . Cancer  Maternal Grandmother   . Cancer Maternal Aunt   . Breast cancer Maternal Aunt     Social History   Socioeconomic History  . Marital status: Married    Spouse name: Jenny Reichmann  . Number of children: 1  . Years of education: Not on file  . Highest education level: Bachelor's degree (e.g., BA, AB, BS)  Occupational History  . Not on file  Social Needs  . Financial resource strain: Not hard at all  . Food insecurity    Worry: Never true    Inability: Never true  . Transportation needs    Medical: No    Non-medical: No  Tobacco Use  . Smoking status: Never Smoker  . Smokeless tobacco: Never Used  Substance and Sexual Activity  . Alcohol use: Yes    Alcohol/week: 1.0 standard drinks    Types: 1 Glasses of wine per week    Comment: nightly  . Drug use: No  . Sexual activity: Not Currently    Partners: Male    Birth control/protection: Post-menopausal  Lifestyle  . Physical activity    Days per week: 4 days    Minutes per session: 40 min  . Stress: Not at all  Relationships  . Social Herbalist on phone: Patient refused    Gets together: Patient refused    Attends religious service: Patient refused    Active member of club or organization: Patient refused  Attends meetings of clubs or organizations: Patient refused    Relationship status: Married  . Intimate partner violence    Fear of current or ex partner: No    Emotionally abused: No    Physically abused: No    Forced sexual activity: No  Other Topics Concern  . Not on file  Social History Narrative  . Not on file     Current Outpatient Medications:  .  amLODipine (NORVASC) 5 MG tablet, Take 1 tablet (5 mg total) by mouth every evening., Disp: 90 tablet, Rfl: 0 .  aspirin 81 MG tablet, Take 1 tablet (81 mg total) by mouth daily., Disp: 30 tablet, Rfl: 12 .  Calcium Polycarbophil (FIBER-CAPS PO), Take 1 capsule by mouth as needed (Constipation)., Disp: , Rfl:  .  cyclobenzaprine (FLEXERIL) 10 MG tablet,  Take 1 tablet (10 mg total) by mouth at bedtime., Disp: 90 tablet, Rfl: 0 .  fluticasone (FLONASE) 50 MCG/ACT nasal spray, Place 2 sprays into both nostrils daily., Disp: 16 g, Rfl: 6 .  loratadine (CLARITIN) 10 MG tablet, TAKE ONE TABLET BY MOUTH DAILY, Disp: 90 tablet, Rfl: 0 .  nortriptyline (PAMELOR) 10 MG capsule, TAKE ONE CAPSULE BY MOUTH EVERY NIGHT AT BEDTIME, Disp: 30 capsule, Rfl: 0 .  rosuvastatin (CRESTOR) 10 MG tablet, Take 1 tablet (10 mg total) by mouth daily., Disp: 90 tablet, Rfl: 0 .  valsartan-hydrochlorothiazide (DIOVAN-HCT) 160-12.5 MG tablet, TAKE ONE TABLET BY MOUTH DAILY, Disp: 60 tablet, Rfl: 0 .  Calcium Carbonate-Vitamin D (CALCIUM-VITAMIN D) 500-200 MG-UNIT per tablet, Take 1 tablet by mouth daily., Disp: , Rfl:   No Known Allergies  I personally reviewed active problem list, medication list, allergies, family history, social history, health maintenance with the patient/caregiver today.   ROS  Constitutional: Negative for fever or weight change.  Respiratory: Negative for cough and shortness of breath.   Cardiovascular: Negative for chest pain or palpitations.  Gastrointestinal: Negative for abdominal pain, no bowel changes.  Musculoskeletal: Negative for gait problem or joint swelling.  Skin: Negative for rash.  Neurological: Negative for dizziness or headache.  No other specific complaints in a complete review of systems (except as listed in HPI above).  Objective  Vitals:   06/25/19 0955  BP: 134/72  Pulse: 94  Resp: 16  Temp: (!) 97.3 F (36.3 C)  TempSrc: Temporal  SpO2: 98%  Weight: 164 lb 8 oz (74.6 kg)  Height: 5\' 5"  (1.651 m)    Body mass index is 27.37 kg/m.  Physical Exam  Constitutional: Patient appears well-developed and well-nourished. Overweight.  No distress.  HEENT: head atraumatic, normocephalic, pupils equal and reactive to light Cardiovascular: Normal rate, regular rhythm and normal heart sounds.  No murmur heard. No BLE  edema. Pulmonary/Chest: Effort normal and breath sounds normal. No respiratory distress. Abdominal: Soft.  There is no tenderness. Psychiatric: Patient has a normal mood and affect. behavior is normal. Judgment and thought content normal.  Recent Results (from the past 2160 hour(s))  Lipid panel     Status: Abnormal   Collection Time: 05/14/19 12:00 AM  Result Value Ref Range   Cholesterol 219 (H) <200 mg/dL   HDL 50 > OR = 50 mg/dL   Triglycerides 239 (H) <150 mg/dL    Comment: . If a non-fasting specimen was collected, consider repeat triglyceride testing on a fasting specimen if clinically indicated.  Yates Decamp et al. J. of Clin. Lipidol. [id number]. Marland Kitchen    LDL Cholesterol (Calc) 130 (H) mg/dL (calc)  Comment: Reference range: <100 . Desirable range <100 mg/dL for primary prevention;   <70 mg/dL for patients with CHD or diabetic patients  with > or = 2 CHD risk factors. Marland Kitchen LDL-C is now calculated using the Martin-Hopkins  calculation, which is a validated novel method providing  better accuracy than the Friedewald equation in the  estimation of LDL-C.  Cresenciano Genre et al. Annamaria Helling. [id number]): 2061-2068  (http://education.[facility].com/faq/FAQ164)    Total CHOL/HDL Ratio 4.4 <5.0 (calc)   Non-HDL Cholesterol (Calc) 169 (H) <130 mg/dL (calc)    Comment: For patients with diabetes plus 1 major ASCVD risk  factor, treating to a non-HDL-C goal of <100 mg/dL  (LDL-C of <70 mg/dL) is considered a therapeutic  option.   COMPLETE METABOLIC PANEL WITH GFR     Status: Abnormal   Collection Time: 05/14/19 12:00 AM  Result Value Ref Range   Glucose, Bld 100 (H) 65 - 99 mg/dL    Comment: .            Fasting reference interval . For someone without known diabetes, a glucose value between 100 and 125 mg/dL is consistent with prediabetes and should be confirmed with a follow-up test. .    BUN 13 7 - 25 mg/dL   Creat 0.82 0.60 - 0.93 mg/dL    Comment: For patients >70  years of age, the reference limit for Creatinine is approximately 13% higher for people identified as African-American. .    GFR, Est Non African American 71 > OR = 60 mL/min/1.104m2   GFR, Est African American 83 > OR = 60 mL/min/1.45m2   BUN/Creatinine Ratio NOT APPLICABLE 6 - 22 (calc)   Sodium 141 135 - 146 mmol/L   Potassium 4.8 3.5 - 5.3 mmol/L   Chloride 101 98 - 110 mmol/L   CO2 35 (H) 20 - 32 mmol/L   Calcium 10.9 (H) 8.6 - 10.4 mg/dL   Total Protein 7.4 6.1 - 8.1 g/dL   Albumin 5.0 3.6 - 5.1 g/dL   Globulin 2.4 1.9 - 3.7 g/dL (calc)   AG Ratio 2.1 1.0 - 2.5 (calc)   Total Bilirubin 0.5 0.2 - 1.2 mg/dL   Alkaline phosphatase (APISO) 61 37 - 153 U/L   AST 25 10 - 35 U/L   ALT 23 6 - 29 U/L     PHQ2/9: Depression screen [facility] 2/9 06/25/2019 05/21/2019 05/14/2019 04/01/2019 12/25/2018  Decreased Interest 0 0 0 0 0  Down, Depressed, Hopeless 0 0 0 0 1  PHQ - 2 Score 0 0 0 0 1  Altered sleeping 0 0 0 1 1  Tired, decreased energy 0 0 0 0 0  Change in appetite 0 0 0 0 1  Feeling bad or failure about yourself  0 0 0 0 0  Trouble concentrating 0 0 0 0 0  Moving slowly or fidgety/restless 0 0 0 0 0  Suicidal thoughts 0 0 0 0 0  PHQ-9 Score 0 0 0 1 3  Difficult doing work/chores - Not difficult at all - Not difficult at all Not difficult at all  Some recent data might be hidden    phq 9 is negative   Fall Risk: Fall Risk  06/25/2019 05/21/2019 05/14/2019 04/01/2019 12/25/2018  Falls in the past year? 0 0 0 0 0  Number falls in past yr: 0 0 0 0 0  Injury with Fall? 0 0 0 0 0  Risk for fall due to : - - - - -  Risk  for fall due to: Comment - - - - -    Functional Status Survey: Is the patient deaf or have difficulty hearing?: No Does the patient have difficulty seeing, even when wearing glasses/contacts?: No Does the patient have difficulty concentrating, remembering, or making decisions?: No Does the patient have difficulty walking or climbing stairs?: No Does the patient  have difficulty dressing or bathing?: No Does the patient have difficulty doing errands alone such as visiting a doctor's office or shopping?: No    Assessment & Plan  1. Essential hypertension  - amLODipine (NORVASC) 10 MG tablet; Take 1 tablet (10 mg total) by mouth every evening.  Dispense: 90 tablet; Refill: 1 - valsartan-hydrochlorothiazide (DIOVAN HCT) 320-12.5 MG tablet; Take 1 tablet by mouth daily.  Dispense: 90 tablet; Refill: 1  2. Pure hypercholesterolemia  - rosuvastatin (CRESTOR) 10 MG tablet; Take 1 tablet (10 mg total) by mouth daily.  Dispense: 90 tablet; Refill: 1  3. Other insomnia   4. Daily headache  stable  5. Osteopenia after menopause   6. Perennial allergic rhinitis with seasonal variation  stable

## 2019-06-26 ENCOUNTER — Other Ambulatory Visit: Payer: Self-pay | Admitting: Family Medicine

## 2019-06-26 DIAGNOSIS — M542 Cervicalgia: Secondary | ICD-10-CM

## 2019-06-26 DIAGNOSIS — I10 Essential (primary) hypertension: Secondary | ICD-10-CM

## 2019-06-26 NOTE — Telephone Encounter (Signed)
Requested medication (s) are due for refill today: no  Requested medication (s) are on the active medication list:no  Last refill:  04/01/2019  Future visit scheduled: yes  Notes to clinic:  Medication was discontinued    Requested Prescriptions  Pending Prescriptions Disp Refills   baclofen (LIORESAL) 20 MG tablet [Pharmacy Med Name: BACLOFEN 20 MG TABLET] 90 tablet 0    Sig: TAKE ONE TABLET BY MOUTH EVERY NIGHT AT BEDTIME     Not Delegated - Analgesics:  Muscle Relaxants Failed - 06/26/2019  6:20 AM      Failed - This refill cannot be delegated      Passed - Valid encounter within last 6 months    Recent Outpatient Visits          Yesterday Essential hypertension   [facility] Steele Sizer, MD   1 month ago Daily headache   [facility] Steele Sizer, MD   1 month ago Essential hypertension   [facility] Steele Sizer, MD   2 months ago Essential hypertension   [facility] Steele Sizer, MD   6 months ago Essential hypertension   [facility] Steele Sizer, MD      Future Appointments            In 6 months Steele Sizer, MD [facility]            amLODipine (NORVASC) 2.5 MG tablet [Pharmacy Med Name: amLODIPine BESYLATE 2.5 MG TAB] 90 tablet 0    Sig: TAKE ONE TABLET BY MOUTH EVERY EVENING     Cardiovascular:  Calcium Channel Blockers Passed - 06/26/2019  6:20 AM      Passed - Last BP in normal range    BP Readings from Last 1 Encounters:  06/25/19 134/72         Passed - Valid encounter within last 6 months    Recent Outpatient Visits          Yesterday Essential hypertension   [facility] Steele Sizer, MD   1 month ago Daily headache   [facility] Steele Sizer, MD   1 month ago Essential hypertension   [facility] Steele Sizer, MD   2 months ago  Essential hypertension   [facility] Steele Sizer, MD   6 months ago Essential hypertension   [facility] Steele Sizer, MD      Future Appointments            In 6 months Ancil Boozer, Drue Stager, MD [facility], [facility]

## 2019-07-13 ENCOUNTER — Other Ambulatory Visit: Payer: Self-pay | Admitting: Family Medicine

## 2019-07-13 DIAGNOSIS — R519 Headache, unspecified: Secondary | ICD-10-CM

## 2019-09-03 ENCOUNTER — Other Ambulatory Visit: Payer: Self-pay | Admitting: Family Medicine

## 2019-09-03 DIAGNOSIS — I10 Essential (primary) hypertension: Secondary | ICD-10-CM

## 2019-11-10 ENCOUNTER — Other Ambulatory Visit: Payer: Self-pay | Admitting: Family Medicine

## 2019-11-10 DIAGNOSIS — R519 Headache, unspecified: Secondary | ICD-10-CM

## 2019-11-18 ENCOUNTER — Other Ambulatory Visit: Payer: Self-pay

## 2019-11-18 ENCOUNTER — Ambulatory Visit (INDEPENDENT_AMBULATORY_CARE_PROVIDER_SITE_OTHER): Payer: Medicare Other

## 2019-11-18 VITALS — BP 124/72 | HR 102 | Temp 97.5°F | Resp 16 | Ht 65.0 in | Wt 168.8 lb

## 2019-11-18 DIAGNOSIS — Z0000 Encounter for general adult medical examination without abnormal findings: Secondary | ICD-10-CM

## 2019-11-18 DIAGNOSIS — Z780 Asymptomatic menopausal state: Secondary | ICD-10-CM

## 2019-11-18 DIAGNOSIS — Z1231 Encounter for screening mammogram for malignant neoplasm of breast: Secondary | ICD-10-CM

## 2019-11-18 NOTE — Progress Notes (Signed)
Subjective:   Hannah Lindsey is a 74 y.o. female who presents for an Initial Medicare Annual Wellness Visit.  Review of Systems      Cardiac Risk Factors include: advanced age (>69men, >21 women);dyslipidemia;hypertension     Objective:    Today's Vitals   11/18/19 0853  BP: 124/72  Pulse: (!) 102  Resp: 16  Temp: (!) 97.5 F (36.4 C)  TempSrc: Temporal  SpO2: 97%  Weight: 168 lb 12.8 oz (76.6 kg)  Height: 5\' 5"  (1.651 m)   Body mass index is 28.09 kg/m.  Advanced Directives 11/18/2019 08/19/2018 11/09/2017 01/15/2017 09/06/2016 06/19/2016 05/29/2016  Does Patient Have a Medical Advance Directive? Yes No No No No No No  Type of Paramedic of [place];Living will - - - - - -  Copy of [place] in Chart? No - copy requested - - - - - -  Would patient like information on creating a medical advance directive? - - Yes (MAU/Ambulatory/Procedural Areas - Information given) - - No - patient declined information No - patient declined information    Current Medications (verified) Outpatient Encounter Medications as of 11/18/2019  Medication Sig  . amLODipine (NORVASC) 10 MG tablet Take 1 tablet (10 mg total) by mouth every evening.  . Ascorbic Acid (VITAMIN C) 1000 MG tablet Take 1,000 mg by mouth daily.  Marland Kitchen aspirin 81 MG tablet Take 1 tablet (81 mg total) by mouth daily.  . Calcium Polycarbophil (FIBER-CAPS PO) Take 1 capsule by mouth as needed (Constipation).  . cholecalciferol (VITAMIN D3) 25 MCG (1000 UNIT) tablet Take 2,000 Units by mouth daily.  . fluticasone (FLONASE) 50 MCG/ACT nasal spray Place 2 sprays into both nostrils daily.  Marland Kitchen loratadine (CLARITIN) 10 MG tablet TAKE ONE TABLET BY MOUTH DAILY  . nortriptyline (PAMELOR) 10 MG capsule TAKE ONE CAPSULE BY MOUTH EVERY NIGHT AT BEDTIME  . rosuvastatin (CRESTOR) 10 MG tablet Take 1 tablet (10 mg total) by mouth daily.  . valsartan-hydrochlorothiazide (DIOVAN HCT) 320-12.5 MG tablet  Take 1 tablet by mouth daily.  . [DISCONTINUED] Calcium Carbonate-Vitamin D (CALCIUM-VITAMIN D) 500-200 MG-UNIT per tablet Take 1 tablet by mouth daily.  . [DISCONTINUED] cyclobenzaprine (FLEXERIL) 10 MG tablet Take 1 tablet (10 mg total) by mouth at bedtime.   No facility-administered encounter medications on file as of 11/18/2019.    Allergies (verified) Patient has no known allergies.   History: Past Medical History:  Diagnosis Date  . Allergy   . Hyperglycemia   . Hyperlipidemia   . Hypertension    Past Surgical History:  Procedure Laterality Date  . COLONOSCOPY WITH PROPOFOL N/A 08/19/2018   Procedure: COLONOSCOPY WITH PROPOFOL;  Surgeon: Manya Silvas, MD;  Location: [facility] ENDOSCOPY;  Service: Endoscopy;  Laterality: N/A;  . TONSILLECTOMY     Family History  Problem Relation Age of Onset  . Fibromyalgia Mother   . Heart Problems Mother   . Dementia Father   . Heart Problems Father   . Heart disease Father   . Breast cancer Maternal Grandmother   . Cancer Maternal Grandmother   . Cancer Maternal Aunt   . Breast cancer Maternal Aunt    Social History   Socioeconomic History  . Marital status: Married    Spouse name: Jenny Reichmann  . Number of children: 1  . Years of education: Not on file  . Highest education level: Bachelor's degree (e.g., BA, AB, BS)  Occupational History  . Not on file  Tobacco Use  .  Smoking status: Never Smoker  . Smokeless tobacco: Never Used  Substance and Sexual Activity  . Alcohol use: Yes    Alcohol/week: 1.0 standard drinks    Types: 1 Glasses of wine per week    Comment: nightly  . Drug use: No  . Sexual activity: Not Currently    Partners: Male    Birth control/protection: Post-menopausal  Other Topics Concern  . Not on file  Social History Narrative  . Not on file   Social Determinants of Health   Financial Resource Strain: Low Risk   . Difficulty of Paying Living Expenses: Not hard at all  Food Insecurity: No Food  Insecurity  . Worried About Charity fundraiser in the Last Year: Never true  . Ran Out of Food in the Last Year: Never true  Transportation Needs: No Transportation Needs  . Lack of Transportation (Medical): No  . Lack of Transportation (Non-Medical): No  Physical Activity: Sufficiently Active  . Days of Exercise per Week: 7 days  . Minutes of Exercise per Session: 30 min  Stress: Stress Concern Present  . Feeling of Stress : To some extent  Social Connections:   . Frequency of Communication with Friends and Family:   . Frequency of Social Gatherings with Friends and Family:   . Attends Religious Services:   . Active Member of Clubs or Organizations:   . Attends Archivist Meetings:   Marland Kitchen Marital Status:     Tobacco Counseling Counseling given: Not Answered   Clinical Intake:  Pre-visit preparation completed: Yes  Pain : No/denies pain     BMI - recorded: 28.09 Nutritional Status: BMI 25 -29 Overweight Nutritional Risks: None Diabetes: No  How often do you need to have someone help you when you read instructions, pamphlets, or other written materials from your doctor or pharmacy?: 1 - Never  Interpreter Needed?: No  Information entered by :: Clemetine Marker LPN   Activities of Daily Living In your present state of health, do you have any difficulty performing the following activities: 11/18/2019 06/25/2019  Hearing? N N  Comment declines hearing aids -  Vision? N N  Difficulty concentrating or making decisions? N N  Walking or climbing stairs? N N  Dressing or bathing? N N  Doing errands, shopping? N N  Preparing Food and eating ? N -  Using the Toilet? N -  In the past six months, have you accidently leaked urine? N -  Do you have problems with loss of bowel control? N -  Managing your Medications? N -  Managing your Finances? N -  Housekeeping or managing your Housekeeping? N -  Some recent data might be hidden     Immunizations and Health  Maintenance Immunization History  Administered Date(s) Administered  . Fluad Quad(high Dose 65+) 05/14/2019  . Influenza, High Dose Seasonal PF 04/19/2015, 05/29/2016, 05/09/2017, 05/10/2018  . Influenza-Unspecified 04/07/2014  . PFIZER SARS-COV-2 Vaccination 09/16/2019, 10/07/2019  . Pneumococcal Conjugate-13 10/08/2013  . Pneumococcal Polysaccharide-23 02/29/2012  . Tdap 02/29/2012  . Zoster 04/14/2013   Health Maintenance Due  Topic Date Due  . MAMMOGRAM  10/23/2019    Patient Care Team: Steele Sizer, MD as PCP - General (Family Medicine)  Indicate any recent Medical Services you may have received from other than [facility] providers in the past year (date may be approximate).     Assessment:   This is a routine wellness examination for Sherricka.  Hearing/Vision screen  Hearing Screening   125Hz   250Hz  500Hz  1000Hz  2000Hz  3000Hz  4000Hz  6000Hz  8000Hz   Right ear:           Left ear:           Comments: Pt denies hearing difficulty other than tinnitus   Vision Screening Comments: Vision screenings [facility] Dr. Wyatt Portela  Dietary issues and exercise activities discussed: Current Exercise Habits: Home exercise routine, Type of exercise: treadmill;walking, Time (Minutes): 30, Frequency (Times/Week): 7, Weekly Exercise (Minutes/Week): 210, Intensity: Moderate, Exercise limited by: None identified  Goals    . DIET - INCREASE WATER INTAKE     Recommend to drink at least 6-8 8oz glasses of water per day.      Depression Screen PHQ 2/9 Scores 11/18/2019 06/25/2019 05/21/2019 05/14/2019 04/01/2019 12/25/2018 05/10/2018  PHQ - 2 Score 0 0 0 0 0 1 0  PHQ- 9 Score - 0 0 0 1 3 1     Fall Risk Fall Risk  11/18/2019 06/25/2019 05/21/2019 05/14/2019 04/01/2019  Falls in the past year? 0 0 0 0 0  Number falls in past yr: 0 0 0 0 0  Injury with Fall? 0 0 0 0 0  Risk for fall due to : No Fall Risks - - - -  Risk for fall due to: Comment - - - - -  Follow up Falls prevention  discussed - - - -    FALL RISK PREVENTION PERTAINING TO THE HOME:  Any stairs in or around the home? Yes  If so, do they handrails? Yes   Home free of loose throw rugs in walkways, pet beds, electrical cords, etc? Yes  Adequate lighting in your home to reduce risk of falls? Yes   ASSISTIVE DEVICES UTILIZED TO PREVENT FALLS:  Life alert? No  Use of a cane, walker or w/c? No  Grab bars in the bathroom? Yes  Shower chair or bench in shower? No  Elevated toilet seat or a handicapped toilet? No   DME ORDERS:  DME order needed?  No   TIMED UP AND GO:  Was the test performed? Yes Length of time to ambulate 10 feet: 5 sec.   GAIT:  Appearance of gait: Gait stead-fast and without the use of an assistive device.   Education: Fall risk prevention has been discussed.  Intervention(s) required? No   Cognitive Function:     6CIT Screen 11/18/2019 11/09/2017 05/29/2016  What Year? 0 points 0 points 0 points  What month? 0 points 0 points 0 points  What time? 0 points 0 points 0 points  Count back from 20 0 points 0 points 0 points  Months in reverse 0 points 0 points 0 points  Repeat phrase 0 points 2 points 2 points  Total Score 0 2 2    Screening Tests Health Maintenance  Topic Date Due  . MAMMOGRAM  10/23/2019  . INFLUENZA VACCINE  03/07/2020  . TETANUS/TDAP  02/28/2022  . COLONOSCOPY  08/21/2023  . DEXA SCAN  Completed  . Hepatitis C Screening  Completed  . PNA vac Low Risk Adult  Completed    Qualifies for Shingles Vaccine? Yes  Zostavax completed 2014. Due for Shingrix. Education has been provided regarding the importance of this vaccine. Pt has been advised to call insurance company to determine out of pocket expense. Advised may also receive vaccine at local pharmacy or Health Dept. Verbalized acceptance and understanding.  Tdap: Up to date  Flu Vaccine: Up to date  Pneumococcal Vaccine: Up to date  Cancer Screenings:  Colorectal Screening: Completed  08/20/18. Repeat every 5 years;   Mammogram: Completed 10/23/18. Repeat every year. Ordered today. Pt provided with contact information and advised to call to schedule appt.   Bone Density: Completed 12/06/16. Results reflect  OSTEOPENIA. Repeat every 2 years. Ordered today. Pt provided with contact information and advised to call to schedule appt.   Lung Cancer Screening: (Low Dose CT Chest recommended if Age 62-80 years, 30 pack-year currently smoking OR have quit w/in 15years.) does not qualify.   Additional Screening:  Hepatitis C Screening: does qualify; Completed 05/29/16  Vision Screening: Recommended annual ophthalmology exams for early detection of glaucoma and other disorders of the eye. Is the patient up to date with their annual eye exam?  Yes  Who is the provider or what is the name of the office in which the pt attends annual eye exams? Dr. Wyatt Portela  Dental Screening: Recommended annual dental exams for proper oral hygiene  Community Resource Referral:  CRR required this visit?  No      Plan:    I have personally reviewed and addressed the Medicare Annual Wellness questionnaire and have noted the following in the patient's chart:  A. Medical and social history B. Use of alcohol, tobacco or illicit drugs  C. Current medications and supplements D. Functional ability and status E.  Nutritional status F.  Physical activity G. Advance directives H. List of other physicians I.  Hospitalizations, surgeries, and ER visits in previous 12 months J.  [place] such as hearing and vision if needed, cognitive and depression L. Referrals and appointments   In addition, I have reviewed and discussed with patient certain preventive protocols, quality metrics, and best practice recommendations. A written personalized care plan for preventive services as well as general preventive health recommendations were provided to patient.   Signed,  Clemetine Marker, LPN Nurse Health  Advisor    Nurse Notes: pt doing well and appreciative of visit today

## 2019-11-18 NOTE — Patient Instructions (Signed)
Hannah Lindsey , Thank you for taking time to come for your Medicare Wellness Visit. I appreciate your ongoing commitment to your health goals. Please review the following plan we discussed and let me know if I can assist you in the future.   Screening recommendations/referrals: Colonoscopy: done 08/20/18. Repeat in 2025 Mammogram: done 10/23/18. Please call [phone] to schedule your mammogram and bone density screening.  Bone Density: done 12/06/16 Recommended yearly ophthalmology/optometry visit for glaucoma screening and checkup Recommended yearly dental visit for hygiene and checkup  Vaccinations: Influenza vaccine: done 05/14/19 Pneumococcal vaccine: done 10/08/13 Tdap vaccine: done 02/29/12 Shingles vaccine: Shingrix discussed. Please contact your pharmacy for coverage information.  Covid-19: done 09/16/19 & 10/07/19  Advanced directives: Please bring a copy of your health care power of attorney and living will to the office at your convenience.  Conditions/risks identified: Keep up the great work!  Next appointment: Please follow up in one year for your Medicare Annual Wellness visit.     Preventive Care 32 Years and Older, Female Preventive care refers to lifestyle choices and visits with your health care provider that can promote health and wellness. What does preventive care include?  A yearly physical exam. This is also called an annual well check.  Dental exams once or twice a year.  Routine eye exams. Ask your health care provider how often you should have your eyes checked.  Personal lifestyle choices, including:  Daily care of your teeth and gums.  Regular physical activity.  Eating a healthy diet.  Avoiding tobacco and drug use.  Limiting alcohol use.  Practicing safe sex.  Taking low-dose aspirin every day.  Taking vitamin and mineral supplements as recommended by your health care provider. What happens during an annual well check? The services and  screenings done by your health care provider during your annual well check will depend on your age, overall health, lifestyle risk factors, and family history of disease. Counseling  Your health care provider may ask you questions about your:  Alcohol use.  Tobacco use.  Drug use.  Emotional well-being.  Home and relationship well-being.  Sexual activity.  Eating habits.  History of falls.  Memory and ability to understand (cognition).  Work and work Statistician.  Reproductive health. Screening  You may have the following tests or measurements:  Height, weight, and BMI.  Blood pressure.  Lipid and cholesterol levels. These may be checked every 5 years, or more frequently if you are over 20 years old.  Skin check.  Lung cancer screening. You may have this screening every year starting at age 56 if you have a 30-pack-year history of smoking and currently smoke or have quit within the past 15 years.  Fecal occult blood test (FOBT) of the stool. You may have this test every year starting at age 14.  Flexible sigmoidoscopy or colonoscopy. You may have a sigmoidoscopy every 5 years or a colonoscopy every 10 years starting at age 71.  Hepatitis C blood test.  Hepatitis B blood test.  Sexually transmitted disease (STD) testing.  Diabetes screening. This is done by checking your blood sugar (glucose) after you have not eaten for a while (fasting). You may have this done every 1-3 years.  Bone density scan. This is done to screen for osteoporosis. You may have this done starting at age 80.  Mammogram. This may be done every 1-2 years. Talk to your health care provider about how often you should have regular mammograms. Talk with your health care provider  about your test results, treatment options, and if necessary, the need for more tests. Vaccines  Your health care provider may recommend certain vaccines, such as:  Influenza vaccine. This is recommended every year.   Tetanus, diphtheria, and acellular pertussis (Tdap, Td) vaccine. You may need a Td booster every 10 years.  Zoster vaccine. You may need this after age 16.  Pneumococcal 13-valent conjugate (PCV13) vaccine. One dose is recommended after age 108.  Pneumococcal polysaccharide (PPSV23) vaccine. One dose is recommended after age 71. Talk to your health care provider about which screenings and vaccines you need and how often you need them. This information is not intended to replace advice given to you by your health care provider. Make sure you discuss any questions you have with your health care provider. Document Released: 08/20/2015 Document Revised: 04/12/2016 Document Reviewed: 05/25/2015 Elsevier Interactive Patient Education  2017 [place] Prevention in the Home Falls can cause injuries. They can happen to people of all ages. There are many things you can do to make your home safe and to help prevent falls. What can I do on the outside of my home?  Regularly fix the edges of walkways and driveways and fix any cracks.  Remove anything that might make you trip as you walk through a door, such as a raised step or threshold.  Trim any bushes or trees on the path to your home.  Use bright outdoor lighting.  Clear any walking paths of anything that might make someone trip, such as rocks or tools.  Regularly check to see if handrails are loose or broken. Make sure that both sides of any steps have handrails.  Any raised decks and porches should have guardrails on the edges.  Have any leaves, snow, or ice cleared regularly.  Use sand or salt on walking paths during winter.  Clean up any spills in your garage right away. This includes oil or grease spills. What can I do in the bathroom?  Use night lights.  Install grab bars by the toilet and in the tub and shower. Do not use towel bars as grab bars.  Use non-skid mats or decals in the tub or shower.  If you need to sit  down in the shower, use a plastic, non-slip stool.  Keep the floor dry. Clean up any water that spills on the floor as soon as it happens.  Remove soap buildup in the tub or shower regularly.  Attach bath mats securely with double-sided non-slip rug tape.  Do not have throw rugs and other things on the floor that can make you trip. What can I do in the bedroom?  Use night lights.  Make sure that you have a light by your bed that is easy to reach.  Do not use any sheets or blankets that are too big for your bed. They should not hang down onto the floor.  Have a firm chair that has side arms. You can use this for support while you get dressed.  Do not have throw rugs and other things on the floor that can make you trip. What can I do in the kitchen?  Clean up any spills right away.  Avoid walking on wet floors.  Keep items that you use a lot in easy-to-reach places.  If you need to reach something above you, use a strong step stool that has a grab bar.  Keep electrical cords out of the way.  Do not use floor polish or  wax that makes floors slippery. If you must use wax, use non-skid floor wax.  Do not have throw rugs and other things on the floor that can make you trip. What can I do with my stairs?  Do not leave any items on the stairs.  Make sure that there are handrails on both sides of the stairs and use them. Fix handrails that are broken or loose. Make sure that handrails are as long as the stairways.  Check any carpeting to make sure that it is firmly attached to the stairs. Fix any carpet that is loose or worn.  Avoid having throw rugs at the top or bottom of the stairs. If you do have throw rugs, attach them to the floor with carpet tape.  Make sure that you have a light switch at the top of the stairs and the bottom of the stairs. If you do not have them, ask someone to add them for you. What else can I do to help prevent falls?  Wear shoes that:  Do not have  high heels.  Have rubber bottoms.  Are comfortable and fit you well.  Are closed at the toe. Do not wear sandals.  If you use a stepladder:  Make sure that it is fully opened. Do not climb a closed stepladder.  Make sure that both sides of the stepladder are locked into place.  Ask someone to hold it for you, if possible.  Clearly mark and make sure that you can see:  Any grab bars or handrails.  First and last steps.  Where the edge of each step is.  Use tools that help you move around (mobility aids) if they are needed. These include:  Canes.  Walkers.  Scooters.  Crutches.  Turn on the lights when you go into a dark area. Replace any light bulbs as soon as they burn out.  Set up your furniture so you have a clear path. Avoid moving your furniture around.  If any of your floors are uneven, fix them.  If there are any pets around you, be aware of where they are.  Review your medicines with your doctor. Some medicines can make you feel dizzy. This can increase your chance of falling. Ask your doctor what other things that you can do to help prevent falls. This information is not intended to replace advice given to you by your health care provider. Make sure you discuss any questions you have with your health care provider. Document Released: 05/20/2009 Document Revised: 12/30/2015 Document Reviewed: 08/28/2014 Elsevier Interactive Patient Education  2017 [employer].

## 2019-12-08 ENCOUNTER — Other Ambulatory Visit: Payer: Self-pay | Admitting: Family Medicine

## 2019-12-08 DIAGNOSIS — R519 Headache, unspecified: Secondary | ICD-10-CM

## 2019-12-22 ENCOUNTER — Other Ambulatory Visit: Payer: Self-pay | Admitting: Family Medicine

## 2019-12-22 ENCOUNTER — Ambulatory Visit
Admission: RE | Admit: 2019-12-22 | Discharge: 2019-12-22 | Disposition: A | Discharge status: Home / Self Care | Payer: Medicare Other | Source: Ambulatory Visit | Attending: Family Medicine | Admitting: Family Medicine

## 2019-12-22 ENCOUNTER — Other Ambulatory Visit: Payer: Self-pay

## 2019-12-22 DIAGNOSIS — Z780 Asymptomatic menopausal state: Secondary | ICD-10-CM | POA: Diagnosis not present

## 2019-12-22 DIAGNOSIS — I10 Essential (primary) hypertension: Secondary | ICD-10-CM

## 2019-12-22 DIAGNOSIS — Z13820 Encounter for screening for osteoporosis: Secondary | ICD-10-CM | POA: Diagnosis not present

## 2019-12-22 DIAGNOSIS — Z1231 Encounter for screening mammogram for malignant neoplasm of breast: Secondary | ICD-10-CM | POA: Insufficient documentation

## 2019-12-22 DIAGNOSIS — M8589 Other specified disorders of bone density and structure, multiple sites: Secondary | ICD-10-CM | POA: Diagnosis not present

## 2019-12-22 IMAGING — MG DIGITAL SCREENING BILAT W/ TOMO W/ CAD
8 series · 8 of 24 positions shown · non-contrast
Comparison: Previous exam(s).

CLINICAL DATA: Screening.

EXAM:
DIGITAL SCREENING BILATERAL MAMMOGRAM WITH TOMO AND CAD

[R CC synth-2D]
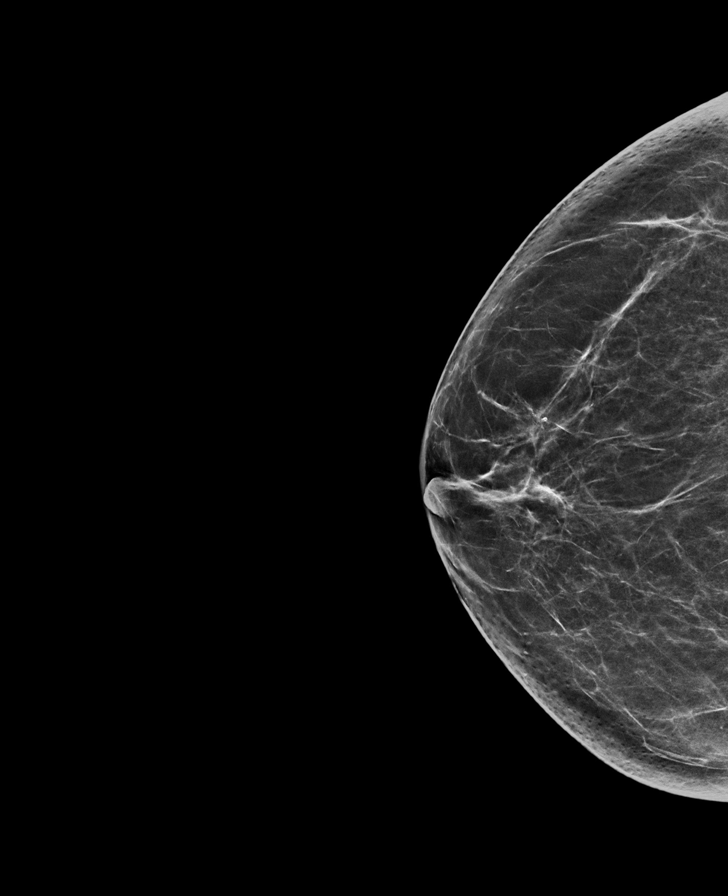

[L MLO synth-2D]
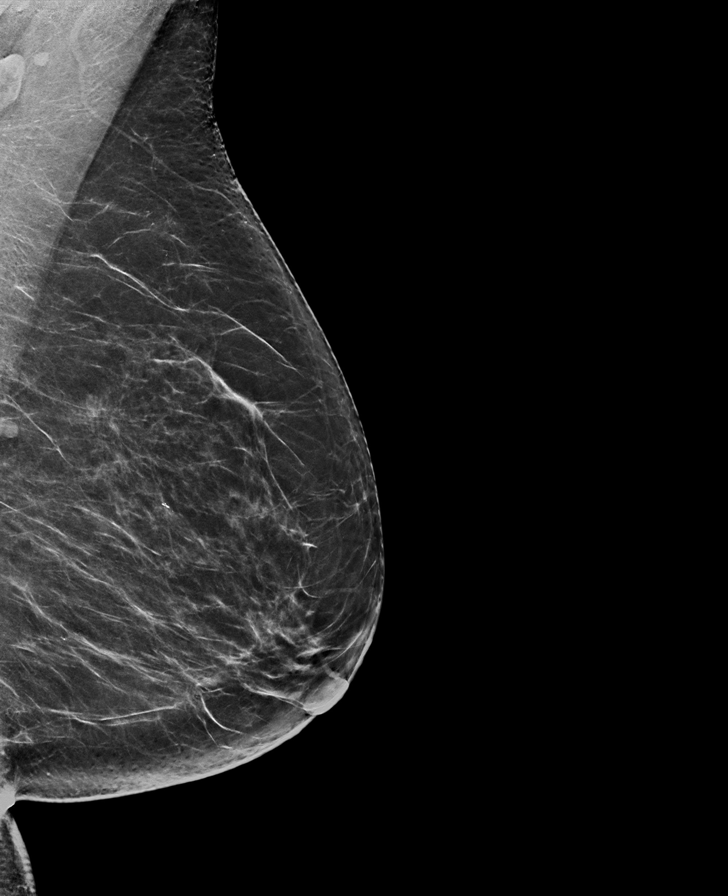

[L CC synth-2D]
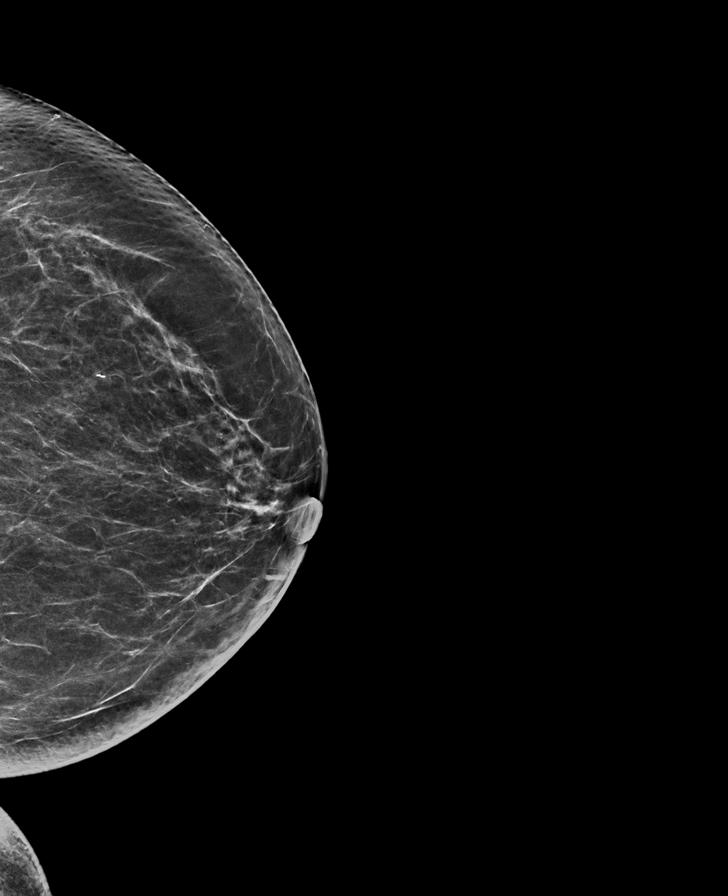

[R MLO synth-2D]
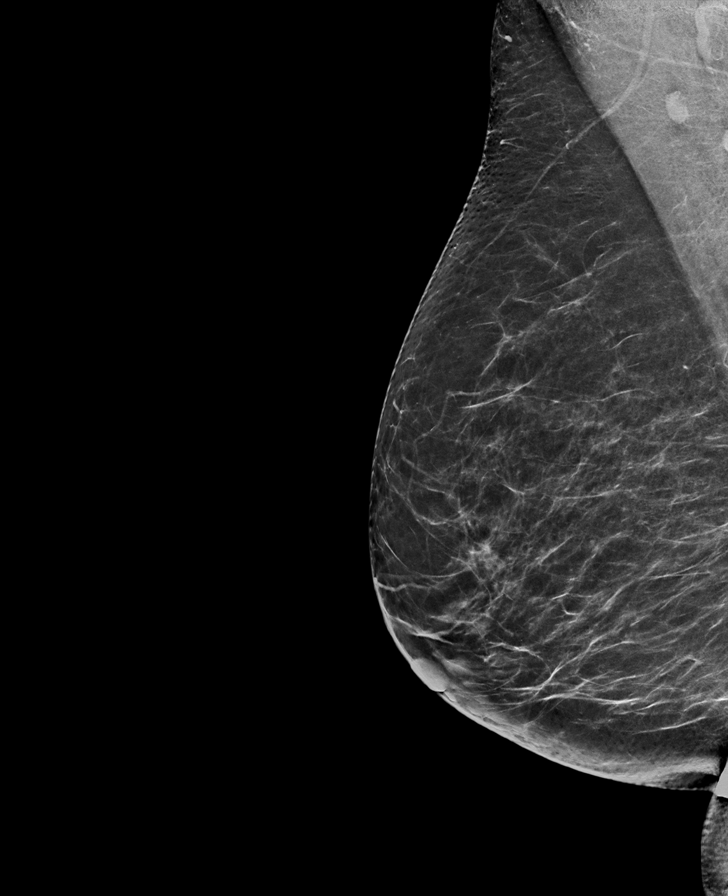

[R MLO tomo · tomo slice 37/72.0]
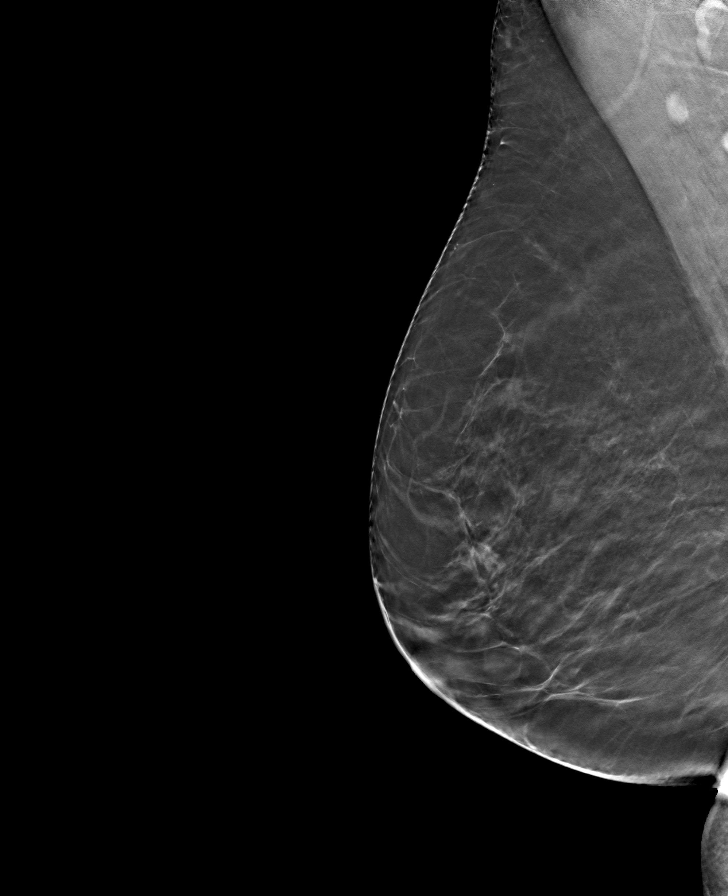

[L CC tomo · tomo slice 35/69.0]
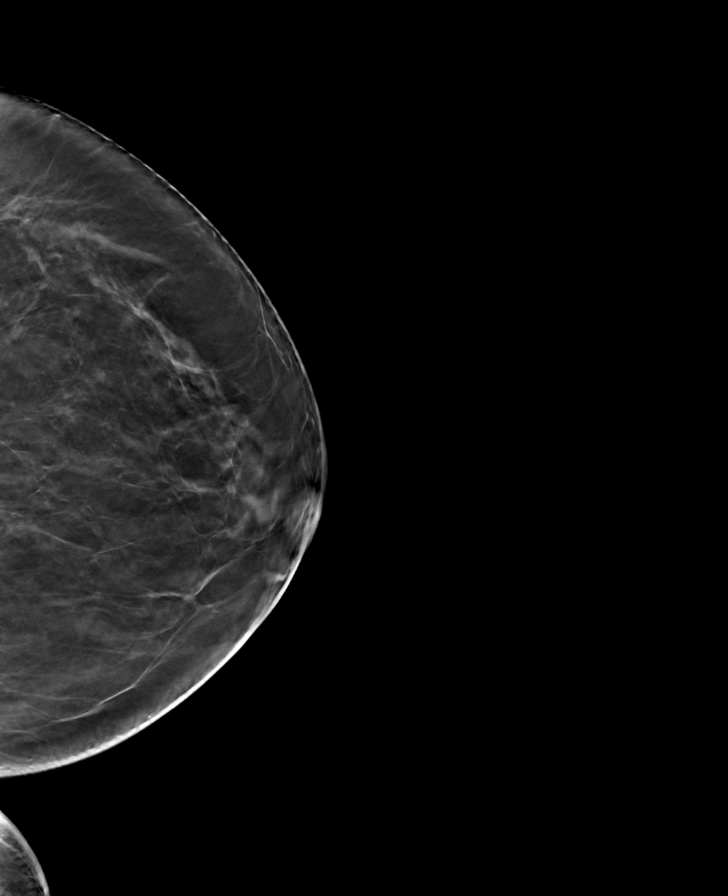

[L MLO tomo · tomo slice 38/75.0]
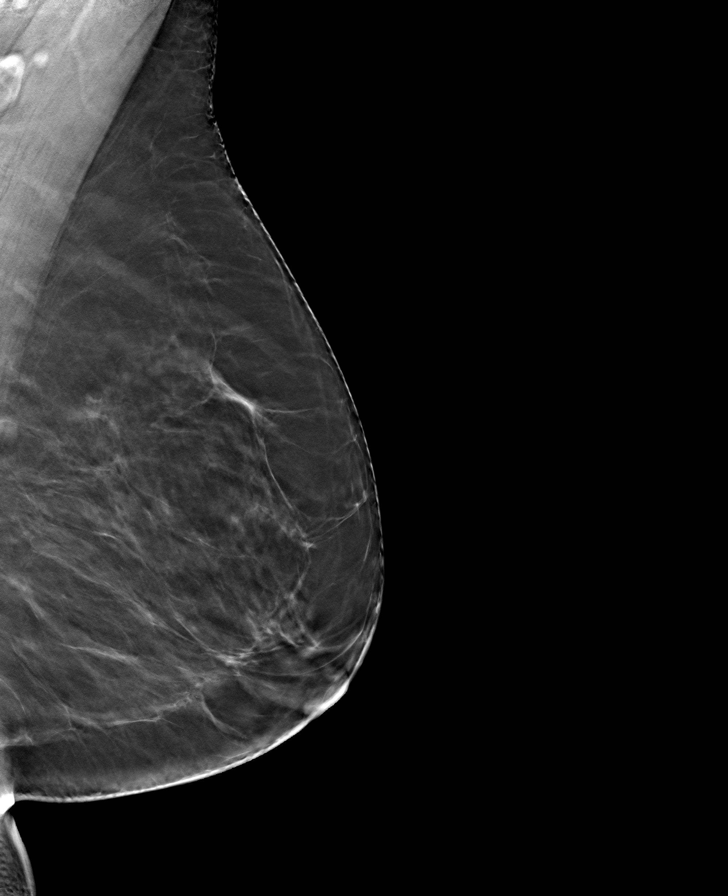

[R CC tomo · tomo slice 36/71.0]
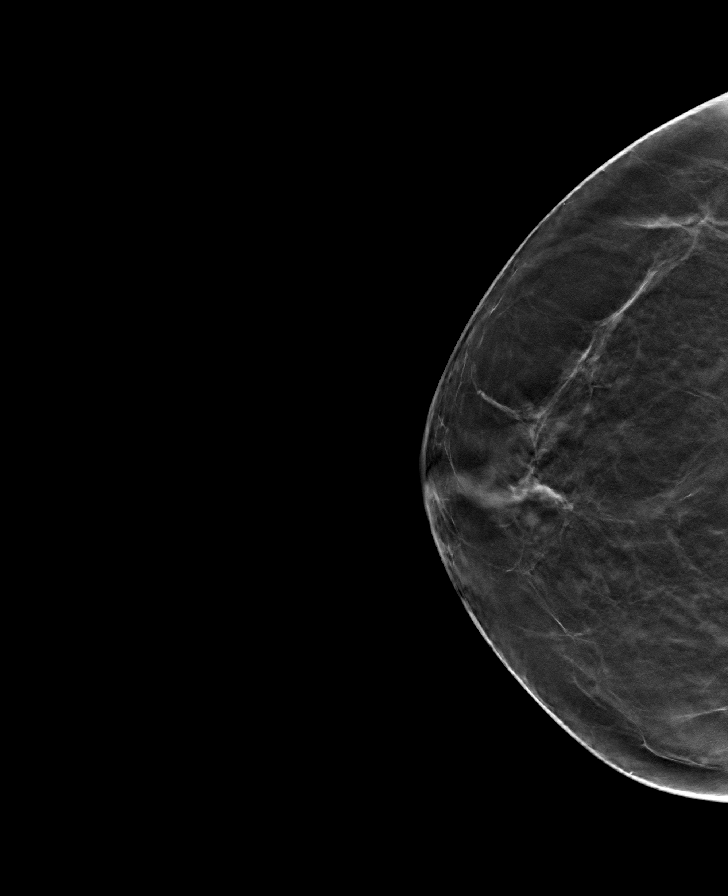

[8 of 24 positions shown; findings below may reference images not displayed]

ACR Breast Density Category b: There are scattered areas of
fibroglandular density.
FINDINGS: There are no findings suspicious for malignancy. Images were
processed with CAD.
IMPRESSION: No mammographic evidence of malignancy. A result letter of this
screening mammogram will be mailed directly to the patient.

RECOMMENDATION:
Screening mammogram in one year. (Code:[TQ])

BI-RADS CATEGORY  1: Negative.

## 2019-12-23 ENCOUNTER — Ambulatory Visit (INDEPENDENT_AMBULATORY_CARE_PROVIDER_SITE_OTHER): Payer: Medicare Other | Admitting: Family Medicine

## 2019-12-23 ENCOUNTER — Encounter: Payer: Self-pay | Admitting: Family Medicine

## 2019-12-23 VITALS — BP 110/70 | HR 94 | Temp 97.1°F | Resp 16 | Ht 65.0 in | Wt 168.0 lb

## 2019-12-23 DIAGNOSIS — E559 Vitamin D deficiency, unspecified: Secondary | ICD-10-CM

## 2019-12-23 DIAGNOSIS — J302 Other seasonal allergic rhinitis: Secondary | ICD-10-CM

## 2019-12-23 DIAGNOSIS — I10 Essential (primary) hypertension: Secondary | ICD-10-CM

## 2019-12-23 DIAGNOSIS — E7800 Pure hypercholesterolemia, unspecified: Secondary | ICD-10-CM

## 2019-12-23 DIAGNOSIS — Z8489 Family history of other specified conditions: Secondary | ICD-10-CM

## 2019-12-23 DIAGNOSIS — G4452 New daily persistent headache (NDPH): Secondary | ICD-10-CM

## 2019-12-23 DIAGNOSIS — Z780 Asymptomatic menopausal state: Secondary | ICD-10-CM

## 2019-12-23 DIAGNOSIS — R739 Hyperglycemia, unspecified: Secondary | ICD-10-CM

## 2019-12-23 DIAGNOSIS — M8580 Other specified disorders of bone density and structure, unspecified site: Secondary | ICD-10-CM | POA: Diagnosis not present

## 2019-12-23 DIAGNOSIS — J3089 Other allergic rhinitis: Secondary | ICD-10-CM

## 2019-12-23 MED ORDER — AMLODIPINE BESYLATE 10 MG PO TABS: 10.0000 mg | ORAL_TABLET | Freq: Every evening | ORAL | 0 refills | Status: DC

## 2019-12-23 MED ORDER — LORATADINE 10 MG PO TABS: 10.0000 mg | ORAL_TABLET | Freq: Every day | ORAL | 0 refills | Status: DC

## 2019-12-23 MED ORDER — FLUTICASONE PROPIONATE 50 MCG/ACT NA SUSP: 2.0000 | Freq: Every day | NASAL | 2 refills | Status: DC

## 2019-12-23 MED ORDER — VALSARTAN-HYDROCHLOROTHIAZIDE 320-12.5 MG PO TABS: 1.0000 | ORAL_TABLET | Freq: Every day | ORAL | 1 refills | Status: DC

## 2019-12-23 MED ORDER — ROSUVASTATIN CALCIUM 10 MG PO TABS: 10.0000 mg | ORAL_TABLET | Freq: Every day | ORAL | 1 refills | Status: DC

## 2019-12-23 NOTE — Progress Notes (Signed)
Name: Hannah Lindsey   MRN: [id number]    DOB: Apr 05, 1946   Date:12/23/2019       Progress Note  Subjective  Chief Complaint  Chief Complaint  Patient presents with  . Hypertension  . Hyperlipidemia  . Headache    HPI  HTN: bp is finally at goal, she is on Norvasc 10 mg and higher dose of Diovan 320/12.5. She states at home has been 120/80's. She denies dizziness, chest pain or palpitation.   Headache: still present, top of the head, intermittent, nortriptyline did not really help and caused constipation, so she stopped medication. Worse at night , does not wake her up from her sleep. Mild and throbbing like pain radiates from left  parietal area to her nose. She is worried because brother was recently diagnosed with brain tumor. She denies any neuro deficit. She states mother and two siblings with parkinson's disease. She denies any tremors or balance problems. Headache has been going on for over 6 months now  Hypercalcemia: she stopped taking otc calcium discussed just following a high calcium diet, we will recheck labs and Pth   Hyperlipidemia: she is back on Crestor and we will recheck labs today . No side effects of medications  Osteopenia: low FRAX score, on high calcium diet, taking supplements and never had a fractureReviewed [place]initis:she uses flonase prn for nasal congestion, and sometimes post-nasal drainage that causes a cough. Stable at this time  Patient Active Problem List   Diagnosis Date Noted  . Perennial allergic rhinitis with seasonal variation 11/09/2017  . Osteopenia after menopause 11/09/2017  . Post-menopausal 09/06/2016  . Hyperlipidemia 06/19/2016  . Essential hypertension 06/04/2015    Past Surgical History:  Procedure Laterality Date  . COLONOSCOPY WITH PROPOFOL N/A 08/19/2018   Procedure: COLONOSCOPY WITH PROPOFOL;  Surgeon: Manya Silvas, MD;  Location: [facility] ENDOSCOPY;  Service:  Endoscopy;  Laterality: N/A;  . TONSILLECTOMY      Family History  Problem Relation Age of Onset  . Fibromyalgia Mother   . Heart Problems Mother   . Dementia Father   . Heart Problems Father   . Heart disease Father   . Breast cancer Maternal Grandmother   . Cancer Maternal Grandmother   . Cancer Maternal Aunt   . Breast cancer Maternal Aunt     Social History   Tobacco Use  . Smoking status: Never Smoker  . Smokeless tobacco: Never Used  Substance Use Topics  . Alcohol use: Yes    Alcohol/week: 1.0 standard drinks    Types: 1 Glasses of wine per week    Comment: nightly     Current Outpatient Medications:  .  amLODipine (NORVASC) 10 MG tablet, Take 1 tablet (10 mg total) by mouth every evening., Disp: 90 tablet, Rfl: 0 .  Ascorbic Acid (VITAMIN C) 1000 MG tablet, Take 1,000 mg by mouth daily., Disp: , Rfl:  .  aspirin 81 MG tablet, Take 1 tablet (81 mg total) by mouth daily., Disp: 30 tablet, Rfl: 12 .  Calcium Polycarbophil (FIBER-CAPS PO), Take 1 capsule by mouth as needed (Constipation)., Disp: , Rfl:  .  cholecalciferol (VITAMIN D3) 25 MCG (1000 UNIT) tablet, Take 2,000 Units by mouth daily., Disp: , Rfl:  .  fluticasone (FLONASE) 50 MCG/ACT nasal spray, Place 2 sprays into both nostrils daily., Disp: 16 g, Rfl: 2 .  loratadine (CLARITIN) 10 MG tablet, Take 1 tablet (10 mg total) by mouth daily., Disp:  90 tablet, Rfl: 0 .  rosuvastatin (CRESTOR) 10 MG tablet, Take 1 tablet (10 mg total) by mouth daily., Disp: 90 tablet, Rfl: 1 .  valsartan-hydrochlorothiazide (DIOVAN HCT) 320-12.5 MG tablet, Take 1 tablet by mouth daily., Disp: 90 tablet, Rfl: 1  No Known Allergies  I personally reviewed active problem list, medication list, allergies, family history, social history, health maintenance with the patient/caregiver today.   ROS  Constitutional: Negative for fever or weight change.  Respiratory: Negative for cough and shortness of breath.   Cardiovascular: Negative  for chest pain or palpitations.  Gastrointestinal: Negative for abdominal pain, no bowel changes.  Musculoskeletal: Negative for gait problem or joint swelling.  Skin: Negative for rash.  Neurological: Negative for dizziness , positive for intermittent headache.  No other specific complaints in a complete review of systems (except as listed in HPI above).   Objective  Vitals:   12/23/19 0808  BP: 110/70  Pulse: 94  Resp: 16  Temp: (!) 97.1 F (36.2 C)  TempSrc: Temporal  SpO2: 98%  Weight: 168 lb (76.2 kg)  Height: 5\' 5"  (1.651 m)    Body mass index is 27.96 kg/m.  Physical Exam  Constitutional: Patient appears well-developed and well-nourished. Obese  No distress.  HEENT: head atraumatic, normocephalic, pupils equal and reactive to light,  neck supple, throat within normal limits Cardiovascular: Normal rate, regular rhythm and normal heart sounds.  No murmur heard. No BLE edema. Pulmonary/Chest: Effort normal and breath sounds normal. No respiratory distress. Abdominal: Soft.  There is no tenderness. Psychiatric: Patient has a normal mood and affect. behavior is normal. Judgment and thought content normal.  PHQ2/9: Depression screen [facility] 2/9 12/23/2019 11/18/2019 06/25/2019 05/21/2019 05/14/2019  Decreased Interest 0 0 0 0 0  Down, Depressed, Hopeless 0 0 0 0 0  PHQ - 2 Score 0 0 0 0 0  Altered sleeping 1 - 0 0 0  Tired, decreased energy 0 - 0 0 0  Change in appetite 0 - 0 0 0  Feeling bad or failure about yourself  0 - 0 0 0  Trouble concentrating 0 - 0 0 0  Moving slowly or fidgety/restless 0 - 0 0 0  Suicidal thoughts 0 - 0 0 0  PHQ-9 Score 1 - 0 0 0  Difficult doing work/chores Not difficult at all - - Not difficult at all -  Some recent data might be hidden    phq 9 is negative   Fall Risk: Fall Risk  12/23/2019 11/18/2019 06/25/2019 05/21/2019 05/14/2019  Falls in the past year? 0 0 0 0 0  Number falls in past yr: 0 0 0 0 0  Injury with Fall? 0 0 0 0 0  Risk  for fall due to : - No Fall Risks - - -  Risk for fall due to: Comment - - - - -  Follow up - Falls prevention discussed - - -     Functional Status Survey: Is the patient deaf or have difficulty hearing?: No Does the patient have difficulty seeing, even when wearing glasses/contacts?: No Does the patient have difficulty concentrating, remembering, or making decisions?: No Does the patient have difficulty walking or climbing stairs?: No Does the patient have difficulty dressing or bathing?: No Does the patient have difficulty doing errands alone such as visiting a doctor's office or shopping?: No    Assessment & Plan   1. Essential hypertension  - CBC with Differential/Platelet - COMPLETE METABOLIC PANEL WITH GFR - valsartan-hydrochlorothiazide (DIOVAN  HCT) 320-12.5 MG tablet; Take 1 tablet by mouth daily.  Dispense: 90 tablet; Refill: 1 - amLODipine (NORVASC) 10 MG tablet; Take 1 tablet (10 mg total) by mouth every evening.  Dispense: 90 tablet; Refill: 0  2. Hypercalcemia  - Parathyroid hormone, intact (no Ca)  3. Hyperglycemia  - Hemoglobin A1c  4. Osteopenia after menopause  Discussed FRAX  5. Pure hypercholesterolemia  - Lipid panel - rosuvastatin (CRESTOR) 10 MG tablet; Take 1 tablet (10 mg total) by mouth daily.  Dispense: 90 tablet; Refill: 1  6. Perennial allergic rhinitis with seasonal variation  - loratadine (CLARITIN) 10 MG tablet; Take 1 tablet (10 mg total) by mouth daily.  Dispense: 90 tablet; Refill: 0 - fluticasone (FLONASE) 50 MCG/ACT nasal spray; Place 2 sprays into both nostrils daily.  Dispense: 16 g; Refill: 2  7. Vitamin D deficiency  - VITAMIN D 25 Hydroxy (Vit-D Deficiency, Fractures)  8. Family history of brain tumor  - CT Head Wo Contrast; Future  9. New daily persistent headache  - CT Head Wo Contrast; Future

## 2019-12-24 ENCOUNTER — Other Ambulatory Visit: Payer: Self-pay | Admitting: Family Medicine

## 2019-12-24 DIAGNOSIS — I10 Essential (primary) hypertension: Secondary | ICD-10-CM

## 2019-12-24 LAB — CBC WITH DIFFERENTIAL/PLATELET
Absolute Monocytes: 460 cells/uL (ref 200–950)
Basophils Absolute: 29 cells/uL (ref 0–200)
Basophils Relative: 0.4 %
Eosinophils Absolute: 88 cells/uL (ref 15–500)
Eosinophils Relative: 1.2 %
HCT: 42.1 % (ref 35.0–45.0)
Hemoglobin: 14.3 g/dL (ref 11.7–15.5)
Lymphs Abs: 2278 cells/uL (ref 850–3900)
MCH: 29.3 pg (ref 27.0–33.0)
MCHC: 34 g/dL (ref 32.0–36.0)
MCV: 86.3 fL (ref 80.0–100.0)
MPV: 8.9 fL (ref 7.5–12.5)
Monocytes Relative: 6.3 %
Neutro Abs: 4446 cells/uL (ref 1500–7800)
Neutrophils Relative %: 60.9 %
Platelets: 178 10*3/uL (ref 140–400)
RBC: 4.88 10*6/uL (ref 3.80–5.10)
RDW: 12.2 % (ref 11.0–15.0)
Total Lymphocyte: 31.2 %
WBC: 7.3 10*3/uL (ref 3.8–10.8)

## 2019-12-24 LAB — COMPLETE METABOLIC PANEL WITH GFR
AG Ratio: 1.8 (calc) (ref 1.0–2.5)
ALT: 31 U/L — ABNORMAL HIGH (ref 6–29)
AST: 26 U/L (ref 10–35)
Albumin: 4.9 g/dL (ref 3.6–5.1)
Alkaline phosphatase (APISO): 68 U/L (ref 37–153)
BUN/Creatinine Ratio: 14 (calc) (ref 6–22)
BUN: 15 mg/dL (ref 7–25)
CO2: 32 mmol/L (ref 20–32)
Calcium: 10.7 mg/dL — ABNORMAL HIGH (ref 8.6–10.4)
Chloride: 101 mmol/L (ref 98–110)
Creat: 1.05 mg/dL — ABNORMAL HIGH (ref 0.60–0.93)
GFR, Est African American: 61 mL/min/{1.73_m2} (ref >=60)
GFR, Est Non African American: 53 mL/min/{1.73_m2} — ABNORMAL LOW (ref >=60)
Globulin: 2.8 g/dL (calc) (ref 1.9–3.7)
Glucose, Bld: 102 mg/dL — ABNORMAL HIGH (ref 65–99)
Potassium: 4.9 mmol/L (ref 3.5–5.3)
Sodium: 140 mmol/L (ref 135–146)
Total Bilirubin: 0.4 mg/dL (ref 0.2–1.2)
Total Protein: 7.7 g/dL (ref 6.1–8.1)

## 2019-12-24 LAB — LIPID PANEL
Cholesterol: 197 mg/dL (ref <200)
HDL: 55 mg/dL (ref >=50)
LDL Cholesterol (Calc): 110 mg/dL (calc) — ABNORMAL HIGH
Non-HDL Cholesterol (Calc): 142 mg/dL (calc) — ABNORMAL HIGH (ref <130)
Total CHOL/HDL Ratio: 3.6 (calc) (ref <5.0)
Triglycerides: 204 mg/dL — ABNORMAL HIGH (ref <150)

## 2019-12-24 LAB — HEMOGLOBIN A1C
Hgb A1c MFr Bld: 5.1 % of total Hgb (ref <5.7)
Mean Plasma Glucose: 100 (calc)
eAG (mmol/L): 5.5 (calc)

## 2019-12-24 LAB — PARATHYROID HORMONE, INTACT (NO CA): PTH: 54 pg/mL (ref 14–64)

## 2019-12-24 LAB — VITAMIN D 25 HYDROXY (VIT D DEFICIENCY, FRACTURES): Vit D, 25-Hydroxy: 40 ng/mL (ref 30–100)

## 2019-12-26 ENCOUNTER — Telehealth: Payer: Self-pay | Admitting: Family Medicine

## 2019-12-26 NOTE — Chronic Care Management (AMB) (Signed)
  Chronic Care Management   Note  12/26/2019 Name: KEYLY BALDONADO MRN: [id number] DOB: 1946-04-06  GALEN MALKOWSKI is a 74 y.o. year old female who is a primary care patient of Steele Sizer, MD. I reached out to [name] by phone today in response to a referral sent by Ms. Perlie Mayo Gosdin's health plan.     Ms. Rehberg was given information about Chronic Care Management services today including:  1. CCM service includes personalized support from designated clinical staff supervised by her physician, including individualized plan of care and coordination with other care providers 2. 24/7 contact phone numbers for assistance for urgent and routine care needs. 3. Service will only be billed when office clinical staff spend 20 minutes or more in a month to coordinate care. 4. Only one practitioner may furnish and bill the service in a calendar month. 5. The patient may stop CCM services at any time (effective at the end of the month) by phone call to the office staff. 6. The patient will be responsible for cost sharing (co-pay) of up to 20% of the service fee (after annual deductible is met).  Patient did not agree to enrollment in care management services and does not wish to consider at this time.  Follow up plan: The patient has been provided with contact information for the care management team and has been advised to call with any health related questions or concerns.   [place], [place] [address] Direct Dial: [phone] [email or web address]

## 2019-12-29 ENCOUNTER — Other Ambulatory Visit: Payer: Self-pay

## 2019-12-29 ENCOUNTER — Ambulatory Visit
Admission: RE | Admit: 2019-12-29 | Discharge: 2019-12-29 | Disposition: A | Discharge status: Home / Self Care | Payer: Medicare Other | Source: Ambulatory Visit | Attending: Family Medicine | Admitting: Family Medicine

## 2019-12-29 DIAGNOSIS — G4452 New daily persistent headache (NDPH): Secondary | ICD-10-CM | POA: Insufficient documentation

## 2019-12-29 DIAGNOSIS — R519 Headache, unspecified: Secondary | ICD-10-CM | POA: Diagnosis not present

## 2019-12-29 DIAGNOSIS — Z8489 Family history of other specified conditions: Secondary | ICD-10-CM | POA: Insufficient documentation

## 2020-03-09 ENCOUNTER — Encounter: Payer: Self-pay | Admitting: Family Medicine

## 2020-03-15 ENCOUNTER — Ambulatory Visit
Admission: EM | Admit: 2020-03-15 | Discharge: 2020-03-15 | Disposition: A | Discharge status: Home / Self Care | Payer: Medicare Other | Attending: Family Medicine | Admitting: Family Medicine

## 2020-03-15 ENCOUNTER — Other Ambulatory Visit: Payer: Self-pay

## 2020-03-15 DIAGNOSIS — H02846 Edema of left eye, unspecified eyelid: Secondary | ICD-10-CM

## 2020-03-15 DIAGNOSIS — H02843 Edema of right eye, unspecified eyelid: Secondary | ICD-10-CM | POA: Diagnosis not present

## 2020-03-15 DIAGNOSIS — T7840XA Allergy, unspecified, initial encounter: Secondary | ICD-10-CM

## 2020-03-15 MED ORDER — CETIRIZINE HCL 10 MG PO TABS
10.0000 mg | ORAL_TABLET | Freq: Every day | ORAL | 0 refills | Status: DC
Start: 2020-03-15 — End: 2020-06-07

## 2020-03-15 MED ORDER — MONTELUKAST SODIUM 10 MG PO TABS
10.0000 mg | ORAL_TABLET | Freq: Every day | ORAL | 0 refills | Status: DC
Start: 2020-03-15 — End: 2020-06-07

## 2020-03-15 NOTE — ED Provider Notes (Signed)
[facility]    CSN: [id number] Arrival date & time: 03/15/20  5643      History   Chief Complaint Chief Complaint  Patient presents with   Eye Problem   HPI   74 year old female presents with the above complaint.  Patient reports ongoing eyelid edema that has been intermittent for the past several months.  Patient is concerned that this may be secondary to sinus issues.  She currently has this bilaterally.  Slightly uncomfortable.  No redness, just swelling.  She has recently stopped taking her allergy medication.  No other associated symptoms.  No other complaints.  Past Medical History:  Diagnosis Date   Allergy    Hyperglycemia    Hyperlipidemia    Hypertension     Patient Active Problem List   Diagnosis Date Noted   Perennial allergic rhinitis with seasonal variation 11/09/2017   Osteopenia after menopause 11/09/2017   Post-menopausal 09/06/2016   Hyperlipidemia 06/19/2016   Essential hypertension 06/04/2015    Past Surgical History:  Procedure Laterality Date   COLONOSCOPY WITH PROPOFOL N/A 08/19/2018   Procedure: COLONOSCOPY WITH PROPOFOL;  Surgeon: Manya Silvas, MD;  Location: [facility] ENDOSCOPY;  Service: Endoscopy;  Laterality: N/A;   TONSILLECTOMY      OB History   No obstetric history on file.      Home Medications    Prior to Admission medications   Medication Sig Start Date End Date Taking? Authorizing Provider  amLODipine (NORVASC) 10 MG tablet Take 1 tablet (10 mg total) by mouth every evening. 12/23/19   Steele Sizer, MD  Ascorbic Acid (VITAMIN C) 1000 MG tablet Take 1,000 mg by mouth daily.    [provider]  aspirin 81 MG tablet Take 1 tablet (81 mg total) by mouth daily. 04/19/15   Ashok Norris, MD  Calcium Polycarbophil (FIBER-CAPS PO) Take 1 capsule by mouth as needed (Constipation).    [provider]  cetirizine (ZYRTEC ALLERGY) 10 MG tablet Take 1 tablet (10 mg total) by mouth daily.  03/15/20   Coral Spikes, DO  cholecalciferol (VITAMIN D3) 25 MCG (1000 UNIT) tablet Take 2,000 Units by mouth daily.    [provider]  fluticasone (FLONASE) 50 MCG/ACT nasal spray Place 2 sprays into both nostrils daily. 12/23/19   Steele Sizer, MD  montelukast (SINGULAIR) 10 MG tablet Take 1 tablet (10 mg total) by mouth at bedtime. 03/15/20   Coral Spikes, DO  rosuvastatin (CRESTOR) 10 MG tablet Take 1 tablet (10 mg total) by mouth daily. 12/23/19   Steele Sizer, MD  valsartan-hydrochlorothiazide (DIOVAN HCT) 320-12.5 MG tablet Take 1 tablet by mouth daily. 12/23/19   Steele Sizer, MD  loratadine (CLARITIN) 10 MG tablet Take 1 tablet (10 mg total) by mouth daily. 12/23/19 03/15/20  Steele Sizer, MD    Family History Family History  Problem Relation Age of Onset   Fibromyalgia Mother    Heart Problems Mother    Dementia Father    Heart Problems Father    Heart disease Father    Breast cancer Maternal Grandmother    Cancer Maternal Grandmother    Cancer Maternal Aunt    Breast cancer Maternal Aunt     Social History Social History   Tobacco Use   Smoking status: Never Smoker   Smokeless tobacco: Never Used  Vaping Use   Vaping Use: Never used  Substance Use Topics   Alcohol use: Yes    Alcohol/week: 1.0 standard drink    Types: 1  Glasses of wine per week    Comment: nightly   Drug use: No     Allergies   Patient has no known allergies.   Review of Systems Review of Systems  HENT:       Eyelid swelling.   Eyes:       Swelling of upper eyelids.   Physical Exam Triage Vital Signs ED Triage Vitals  Enc Vitals Group     BP 03/15/20 1005 130/61     Pulse Rate 03/15/20 1005 80     Resp 03/15/20 1005 15     Temp 03/15/20 1005 98.1 F (36.7 C)     Temp Source 03/15/20 1005 Oral     SpO2 03/15/20 1005 99 %     Weight 03/15/20 1004 165 lb (74.8 kg)     Height 03/15/20 1004 5\' 5"  (1.651 m)     Head Circumference --      Peak Flow --        Pain Score 03/15/20 1003 2     Pain Loc --      Pain Edu? --      Excl. in [place]? --    Updated Vital Signs BP 130/61 (BP Location: Left Arm)    Pulse 80    Temp 98.1 F (36.7 C) (Oral)    Resp 15    Ht 5\' 5"  (1.651 m)    Wt 74.8 kg    SpO2 99%    BMI 27.46 kg/m   Visual Acuity Right Eye Distance:   Left Eye Distance:   Bilateral Distance:    Right Eye Near:   Left Eye Near:    Bilateral Near:     Physical Exam Constitutional:      General: She is not in acute distress.    Appearance: Normal appearance. She is not ill-appearing.  HENT:     Head: Normocephalic and atraumatic.  Eyes:     General:        Right eye: No discharge.        Left eye: No discharge.     Conjunctiva/sclera: Conjunctivae normal.     Comments: Upper eyelid edema bilaterally.   Cardiovascular:     Rate and Rhythm: Normal rate and regular rhythm.  Pulmonary:     Effort: Pulmonary effort is normal.     Breath sounds: Normal breath sounds. No wheezing, rhonchi or rales.  Neurological:     Mental Status: She is alert.  Psychiatric:        Mood and Affect: Mood normal.        Behavior: Behavior normal.    UC Treatments / Results  Labs (all labs ordered are listed, but only abnormal results are displayed) Labs Reviewed - No data to display  EKG   Radiology No results found.  Procedures Procedures (including critical care time)  Medications Ordered in UC Medications - No data to display  Initial Impression / Assessment and Plan / UC Course  I have reviewed the triage vital signs and the nursing notes.  Pertinent labs & imaging results that were available during my care of the patient were reviewed by me and considered in my medical decision making (see chart for details).    74 year old female presents with edema of the upper eyelids.  This appears to be allergic in nature.  There is no erythema.  Zyrtec and Singulair as prescribed.  Advised follow-up with PCP to discuss allergy  referral.  Final Clinical Impressions(s) / UC Diagnoses  Final diagnoses:  Edema of eyelid of both eyes  Allergy, initial encounter     Discharge Instructions     Medication as prescribed.  Discuss allergy referral with PCP.  Take care  Dr. Lacinda Axon    ED Prescriptions    Medication Sig Dispense Auth. Provider   cetirizine (ZYRTEC ALLERGY) 10 MG tablet Take 1 tablet (10 mg total) by mouth daily. 30 tablet Gaetana Kawahara G, DO   montelukast (SINGULAIR) 10 MG tablet Take 1 tablet (10 mg total) by mouth at bedtime. 30 tablet Coral Spikes, DO     PDMP not reviewed this encounter.   Coral Spikes, [place] 03/16/20 1301

## 2020-03-15 NOTE — ED Triage Notes (Signed)
Pt with eyelid puffiness off and on. Sometimes in left and sometimes in right. Wonders if it's her sinuses.

## 2020-03-15 NOTE — Discharge Instructions (Signed)
Medication as prescribed.  Discuss allergy referral with PCP.  Take care  Dr. Lacinda Axon

## 2020-05-04 ENCOUNTER — Encounter: Payer: Self-pay | Admitting: Family Medicine

## 2020-05-13 ENCOUNTER — Encounter: Payer: Self-pay | Admitting: Family Medicine

## 2020-05-13 DIAGNOSIS — I10 Essential (primary) hypertension: Secondary | ICD-10-CM

## 2020-05-13 MED ORDER — AMLODIPINE BESYLATE 10 MG PO TABS: 10.0000 mg | ORAL_TABLET | Freq: Every evening | ORAL | 0 refills | Status: DC

## 2020-06-07 NOTE — Progress Notes (Signed)
Name: Hannah Lindsey   MRN: [id number]    DOB: August 15, 1945   Date:06/08/2020       Progress Note  Subjective  Chief Complaint  Follow up   HPI   SWF:UXNA is on Norvasc 10 mg and higher dose of Diovan 320/12.5, but bp is towards low end of normal, sometimes has dizziness, so we will decrease dose of diovan and monitor. She denies dizziness, chest pain or palpitation.   Headache: still present, top of the head, intermittent, nortriptyline did not really help and caused constipation, so she stopped medication. Worse at night , does not wake her up from her sleep. Mild and throbbing like pain radiates from left  parietal area to her nose. She is worried because brother was recently diagnosed with brain tumor. She denies any neuro deficit. She states mother and two siblings with parkinson's disease. She denies any tremors or balance problems.  Going on for almost one year, CT head was done this Summer and was negative, she would like to see neurologist   Hypercalcemia: she stopped taking otc calcium and labs normalized   Hyperlipidemia: she was taking Crestor last visit but LDL still above goal, she agrees on going up on dose.  No side effects of medications  Osteopenia: low FRAX score, on high calcium diet, reviewed last bone density   Perennial allergic rhinitis:she uses flonase prn for nasal congestion, she recently went to [facility] because of eye irritation, advised to use eye drops and has improved   Weight loss: lost 15 lbs in the past 5 months, she states decreased intake of carbohydrates, such as chips, crackers, bread, desserts. She has also been physically active. No change in bowel movements or blood in stools. Mammogram and colonoscopy up to date  Patient Active Problem List   Diagnosis Date Noted  . Perennial allergic rhinitis with seasonal variation 11/09/2017  . Osteopenia after menopause 11/09/2017  . Post-menopausal 09/06/2016  . Hyperlipidemia 06/19/2016  . Essential  hypertension 06/04/2015    Past Surgical History:  Procedure Laterality Date  . COLONOSCOPY WITH PROPOFOL N/A 08/19/2018   Procedure: COLONOSCOPY WITH PROPOFOL;  Surgeon: Manya Silvas, MD;  Location: [facility] ENDOSCOPY;  Service: Endoscopy;  Laterality: N/A;  . TONSILLECTOMY      Family History  Problem Relation Age of Onset  . Fibromyalgia Mother   . Heart Problems Mother   . Dementia Father   . Heart Problems Father   . Heart disease Father   . Breast cancer Maternal Grandmother   . Cancer Maternal Grandmother   . Cancer Maternal Aunt   . Breast cancer Maternal Aunt     Social History   Tobacco Use  . Smoking status: Never Smoker  . Smokeless tobacco: Never Used  Substance Use Topics  . Alcohol use: Yes    Alcohol/week: 1.0 standard drink    Types: 1 Glasses of wine per week    Comment: nightly     Current Outpatient Medications:  .  amLODipine (NORVASC) 10 MG tablet, Take 1 tablet (10 mg total) by mouth every evening., Disp: 90 tablet, Rfl: 0 .  Ascorbic Acid (VITAMIN C) 1000 MG tablet, Take 1,000 mg by mouth daily., Disp: , Rfl:  .  aspirin 81 MG tablet, Take 1 tablet (81 mg total) by mouth daily., Disp: 30 tablet, Rfl: 12 .  Calcium Polycarbophil (FIBER-CAPS PO), Take 1 capsule by mouth as needed (Constipation)., Disp: , Rfl:  .  cholecalciferol (VITAMIN D3) 25 MCG (1000 UNIT) tablet, Take  2,000 Units by mouth daily., Disp: , Rfl:  .  fluticasone (FLONASE) 50 MCG/ACT nasal spray, Place 2 sprays into both nostrils daily., Disp: 16 g, Rfl: 2 .  rosuvastatin (CRESTOR) 20 MG tablet, Take 1 tablet (20 mg total) by mouth daily., Disp: 90 tablet, Rfl: 1 .  valsartan-hydrochlorothiazide (DIOVAN HCT) 160-12.5 MG tablet, Take 1 tablet by mouth daily., Disp: 90 tablet, Rfl: 1  No Known Allergies  I personally reviewed active problem list, medication list, allergies, family history, social history, health maintenance with the patient/caregiver  today.   ROS  Constitutional: Negative for fever or weight change.  Respiratory: Negative for cough and shortness of breath.   Cardiovascular: Negative for chest pain or palpitations.  Gastrointestinal: Negative for abdominal pain, no bowel changes.  Musculoskeletal: Negative for gait problem or joint swelling.  Skin: Negative for rash.  Neurological: Negative for dizziness , positive for  headache.  No other specific complaints in a complete review of systems (except as listed in HPI above).\  Objective  Vitals:   06/08/20 1416  BP: 112/72  Pulse: 96  Resp: 16  Temp: 98.2 F (36.8 C)  TempSrc: Oral  SpO2: 96%  Weight: 153 lb 12.8 oz (69.8 kg)  Height: 5\' 5"  (1.651 m)    Body mass index is 25.59 kg/m.  Physical Exam  Constitutional: Patient appears well-developed and well-nourished.  No distress.  HEENT: head atraumatic, normocephalic, pupils equal and reactive to light,  neck supple Cardiovascular: Normal rate, regular rhythm and normal heart sounds.  No murmur heard. No BLE edema. Pulmonary/Chest: Effort normal and breath sounds normal. No respiratory distress. Abdominal: Soft.  There is no tenderness. Psychiatric: Patient has a normal mood and affect. behavior is normal. Judgment and thought content normal.  PHQ2/9: Depression screen [facility] 2/9 06/08/2020 12/23/2019 11/18/2019 06/25/2019 05/21/2019  Decreased Interest 0 0 0 0 0  Down, Depressed, Hopeless 0 0 0 0 0  PHQ - 2 Score 0 0 0 0 0  Altered sleeping - 1 - 0 0  Tired, decreased energy - 0 - 0 0  Change in appetite - 0 - 0 0  Feeling bad or failure about yourself  - 0 - 0 0  Trouble concentrating - 0 - 0 0  Moving slowly or fidgety/restless - 0 - 0 0  Suicidal thoughts - 0 - 0 0  PHQ-9 Score - 1 - 0 0  Difficult doing work/chores - Not difficult at all - - Not difficult at all  Some recent data might be hidden    phq 9 is negative  Fall Risk: Fall Risk  06/08/2020 12/23/2019 11/18/2019 06/25/2019 05/21/2019   Falls in the past year? 0 0 0 0 0  Number falls in past yr: 0 0 0 0 0  Injury with Fall? 0 0 0 0 0  Risk for fall due to : - - No Fall Risks - -  Risk for fall due to: Comment - - - - -  Follow up - - Falls prevention discussed - -     Functional Status Survey: Is the patient deaf or have difficulty hearing?: No Does the patient have difficulty seeing, even when wearing glasses/contacts?: No Does the patient have difficulty concentrating, remembering, or making decisions?: No Does the patient have difficulty walking or climbing stairs?: No Does the patient have difficulty dressing or bathing?: No Does the patient have difficulty doing errands alone such as visiting a doctor's office or shopping?: No    Assessment & Plan  1. Essential hypertension  - valsartan-hydrochlorothiazide (DIOVAN HCT) 160-12.5 MG tablet; Take 1 tablet by mouth daily.  Dispense: 90 tablet; Refill: 1 - amLODipine (NORVASC) 10 MG tablet; Take 1 tablet (10 mg total) by mouth every evening.  Dispense: 90 tablet; Refill: 0  2. Pure hypercholesterolemia  - rosuvastatin (CRESTOR) 20 MG tablet; Take 1 tablet (20 mg total) by mouth daily.  Dispense: 90 tablet; Refill: 1  3. Hyperglycemia   4. Need for immunization against influenza  - Flu Vaccine QUAD High Dose(Fluad)  5. Osteopenia after menopause   6. Vitamin D deficiency   7. Daily headache  - Ambulatory referral to Neurology  8. Perennial allergic rhinitis with seasonal variation   9. Dry eyes   10. Senile purpura ([place])   11. Neck pain   12. Other insomnia  Secondary to body aches   13. Weight loss  Advised to get a scale and monitor her weight, discussed labs but she would like to hold off

## 2020-06-08 ENCOUNTER — Ambulatory Visit (INDEPENDENT_AMBULATORY_CARE_PROVIDER_SITE_OTHER): Payer: Medicare Other | Admitting: Family Medicine

## 2020-06-08 ENCOUNTER — Encounter: Payer: Self-pay | Admitting: Family Medicine

## 2020-06-08 ENCOUNTER — Other Ambulatory Visit: Payer: Self-pay

## 2020-06-08 VITALS — BP 112/72 | HR 96 | Temp 98.2°F | Resp 16 | Ht 65.0 in | Wt 153.8 lb

## 2020-06-08 DIAGNOSIS — R739 Hyperglycemia, unspecified: Secondary | ICD-10-CM | POA: Diagnosis not present

## 2020-06-08 DIAGNOSIS — Z23 Encounter for immunization: Secondary | ICD-10-CM

## 2020-06-08 DIAGNOSIS — J3089 Other allergic rhinitis: Secondary | ICD-10-CM

## 2020-06-08 DIAGNOSIS — E7800 Pure hypercholesterolemia, unspecified: Secondary | ICD-10-CM | POA: Diagnosis not present

## 2020-06-08 DIAGNOSIS — I10 Essential (primary) hypertension: Secondary | ICD-10-CM

## 2020-06-08 DIAGNOSIS — D692 Other nonthrombocytopenic purpura: Secondary | ICD-10-CM

## 2020-06-08 DIAGNOSIS — R519 Headache, unspecified: Secondary | ICD-10-CM

## 2020-06-08 DIAGNOSIS — H04123 Dry eye syndrome of bilateral lacrimal glands: Secondary | ICD-10-CM

## 2020-06-08 DIAGNOSIS — E559 Vitamin D deficiency, unspecified: Secondary | ICD-10-CM

## 2020-06-08 DIAGNOSIS — J302 Other seasonal allergic rhinitis: Secondary | ICD-10-CM

## 2020-06-08 DIAGNOSIS — R634 Abnormal weight loss: Secondary | ICD-10-CM

## 2020-06-08 DIAGNOSIS — M8580 Other specified disorders of bone density and structure, unspecified site: Secondary | ICD-10-CM

## 2020-06-08 DIAGNOSIS — G4709 Other insomnia: Secondary | ICD-10-CM

## 2020-06-08 DIAGNOSIS — Z780 Asymptomatic menopausal state: Secondary | ICD-10-CM

## 2020-06-08 DIAGNOSIS — M542 Cervicalgia: Secondary | ICD-10-CM

## 2020-06-08 MED ORDER — AMLODIPINE BESYLATE 10 MG PO TABS: 10.0000 mg | ORAL_TABLET | Freq: Every evening | ORAL | 0 refills | Status: DC

## 2020-06-08 MED ORDER — ROSUVASTATIN CALCIUM 20 MG PO TABS: 20.0000 mg | ORAL_TABLET | Freq: Every day | ORAL | 1 refills | Status: DC

## 2020-06-08 MED ORDER — VALSARTAN-HYDROCHLOROTHIAZIDE 160-12.5 MG PO TABS: 1.0000 | ORAL_TABLET | Freq: Every day | ORAL | 1 refills | Status: DC

## 2020-08-17 DIAGNOSIS — R519 Headache, unspecified: Secondary | ICD-10-CM | POA: Diagnosis not present

## 2020-08-17 DIAGNOSIS — I10 Essential (primary) hypertension: Secondary | ICD-10-CM | POA: Diagnosis not present

## 2020-08-17 DIAGNOSIS — H538 Other visual disturbances: Secondary | ICD-10-CM | POA: Diagnosis not present

## 2020-08-22 DIAGNOSIS — H538 Other visual disturbances: Secondary | ICD-10-CM | POA: Insufficient documentation

## 2020-08-22 DIAGNOSIS — R519 Headache, unspecified: Secondary | ICD-10-CM | POA: Insufficient documentation

## 2020-10-19 DIAGNOSIS — R519 Headache, unspecified: Secondary | ICD-10-CM | POA: Diagnosis not present

## 2020-10-19 DIAGNOSIS — I10 Essential (primary) hypertension: Secondary | ICD-10-CM | POA: Diagnosis not present

## 2020-10-19 DIAGNOSIS — H538 Other visual disturbances: Secondary | ICD-10-CM | POA: Diagnosis not present

## 2020-10-20 DIAGNOSIS — F411 Generalized anxiety disorder: Secondary | ICD-10-CM | POA: Insufficient documentation

## 2020-11-03 ENCOUNTER — Other Ambulatory Visit: Payer: Self-pay | Admitting: Family Medicine

## 2020-11-03 DIAGNOSIS — I10 Essential (primary) hypertension: Secondary | ICD-10-CM

## 2020-11-03 NOTE — Telephone Encounter (Signed)
Requested Prescriptions  Pending Prescriptions Disp Refills  . amLODipine (NORVASC) 10 MG tablet [Pharmacy Med Name: AMLODIPINE BESYLATE 10 MG TAB] 90 tablet 0    Sig: TAKE 1 TABLET BY MOUTH EVERY DAY IN THE EVENING     Cardiovascular:  Calcium Channel Blockers Passed - 11/03/2020  2:24 AM      Passed - Last BP in normal range    BP Readings from Last 1 Encounters:  06/08/20 112/72         Passed - Valid encounter within last 6 months    Recent Outpatient Visits          4 months ago Essential hypertension   [facility] Steele Sizer, MD   10 months ago Essential hypertension   [facility] Steele Sizer, MD   1 year ago Essential hypertension   [facility] Steele Sizer, MD   1 year ago Daily headache   [facility] Steele Sizer, MD   1 year ago Essential hypertension   [facility] Steele Sizer, MD      Future Appointments            In 2 weeks  [facility], [place]   In 1 month Steele Sizer, MD [facility], [facility]

## 2020-11-10 ENCOUNTER — Encounter: Payer: Self-pay | Admitting: Family Medicine

## 2020-11-18 ENCOUNTER — Ambulatory Visit: Payer: Medicare Other

## 2020-11-19 ENCOUNTER — Other Ambulatory Visit: Payer: Self-pay | Admitting: Family Medicine

## 2020-11-19 DIAGNOSIS — E7800 Pure hypercholesterolemia, unspecified: Secondary | ICD-10-CM

## 2020-11-23 ENCOUNTER — Ambulatory Visit: Payer: Medicare Other

## 2020-11-29 ENCOUNTER — Encounter: Payer: Self-pay | Admitting: Family Medicine

## 2020-11-30 ENCOUNTER — Encounter: Payer: Self-pay | Admitting: Emergency Medicine

## 2020-11-30 ENCOUNTER — Other Ambulatory Visit: Payer: Self-pay

## 2020-11-30 ENCOUNTER — Ambulatory Visit (INDEPENDENT_AMBULATORY_CARE_PROVIDER_SITE_OTHER)
Admit: 2020-11-30 | Discharge: 2020-11-30 | Disposition: A | Discharge status: Home / Self Care | Payer: Medicare Other | Attending: Family Medicine | Admitting: Family Medicine

## 2020-11-30 ENCOUNTER — Ambulatory Visit
Admission: EM | Admit: 2020-11-30 | Discharge: 2020-11-30 | Disposition: A | Discharge status: Home / Self Care | Payer: Medicare Other

## 2020-11-30 DIAGNOSIS — S0993XA Unspecified injury of face, initial encounter: Secondary | ICD-10-CM

## 2020-11-30 DIAGNOSIS — H05233 Hemorrhage of bilateral orbit: Secondary | ICD-10-CM

## 2020-11-30 DIAGNOSIS — S022XXA Fracture of nasal bones, initial encounter for closed fracture: Secondary | ICD-10-CM

## 2020-11-30 DIAGNOSIS — W19XXXA Unspecified fall, initial encounter: Secondary | ICD-10-CM

## 2020-11-30 DIAGNOSIS — M7989 Other specified soft tissue disorders: Secondary | ICD-10-CM | POA: Diagnosis not present

## 2020-11-30 DIAGNOSIS — J320 Chronic maxillary sinusitis: Secondary | ICD-10-CM | POA: Diagnosis not present

## 2020-11-30 DIAGNOSIS — J322 Chronic ethmoidal sinusitis: Secondary | ICD-10-CM | POA: Diagnosis not present

## 2020-11-30 IMAGING — CT CT MAXILLOFACIAL W/O CM
1 series · 15 of 30 positions shown, 19 images · non-contrast
Comparison: Head CT [DATE].

CLINICAL DATA: Provided history: Facial trauma; fall on face.
Additional provided: Patient reports fall 2 days ago, mainly hitting
nose. Bruising, swelling and abrasion on face and head.

EXAM:
CT MAXILLOFACIAL WITHOUT CONTRAST
TECHNIQUE: Multidetector CT imaging of the maxillofacial structures was
performed. Multiplanar CT image reconstructions were also generated.

[Series 2: max soft · axial · 0.38mm/px · z∈[-227,-57]mm · 15 of 93 slices shown, 19 images]
[im 4/93  brain]
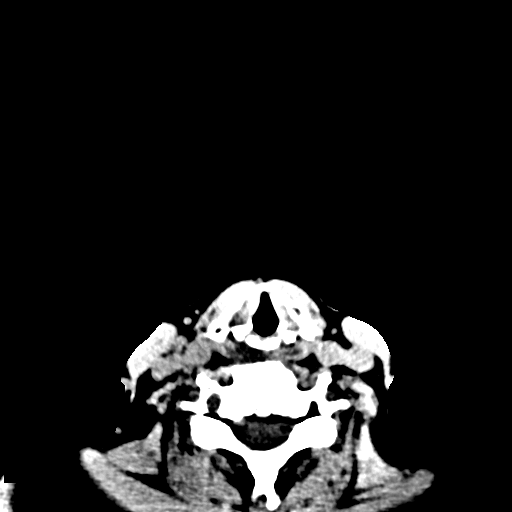
[im 4/93  bone]
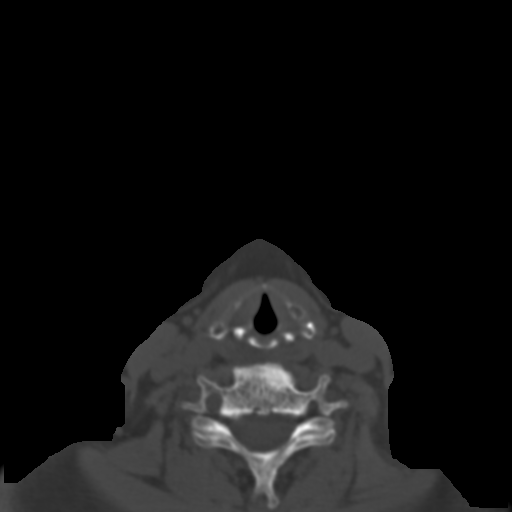
[im 10/93  bone]
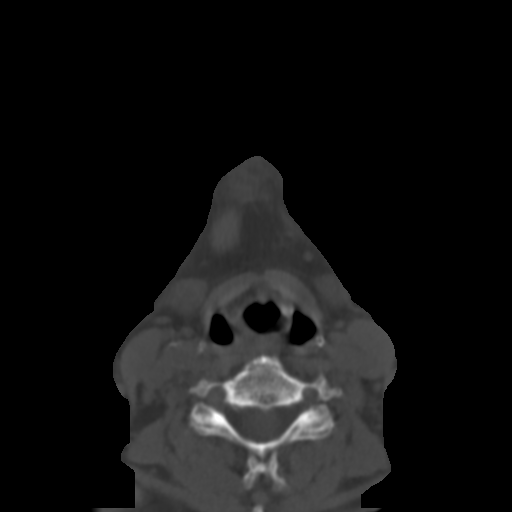
[im 16/93  bone]
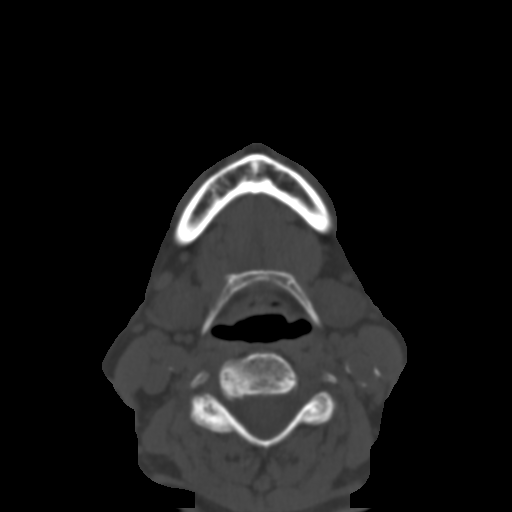
[im 23/93  bone]
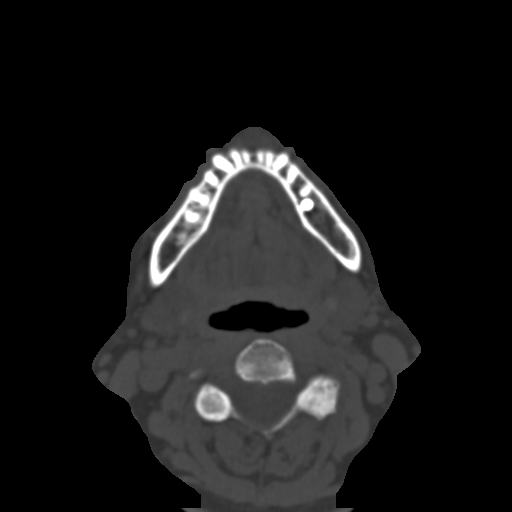
[im 29/93  brain]
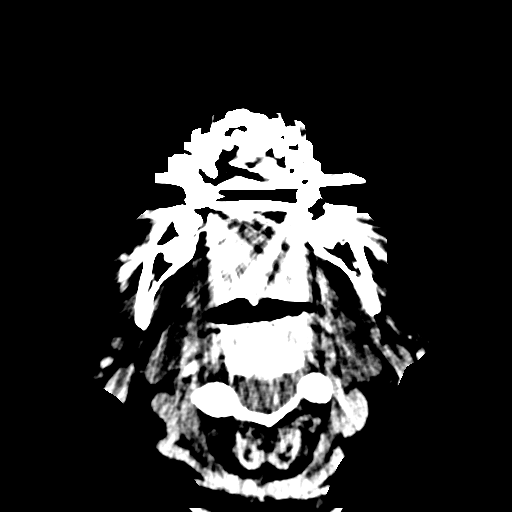
[im 29/93  bone]
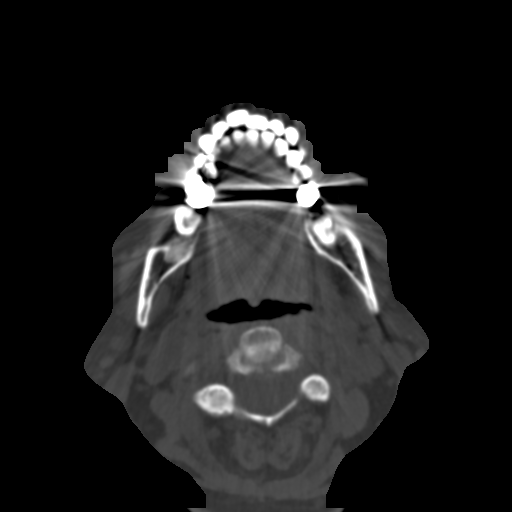
[im 35/93  bone]
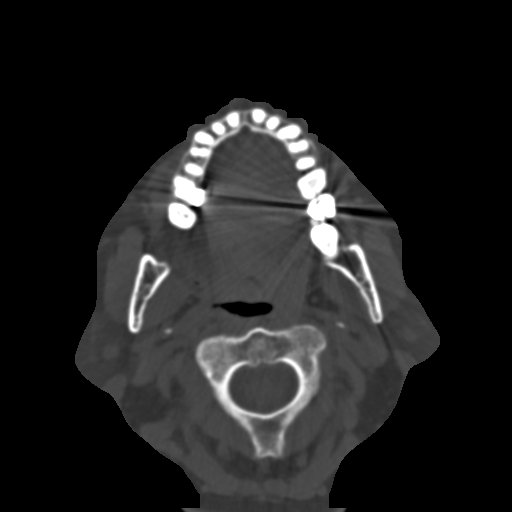
[im 42/93  bone]
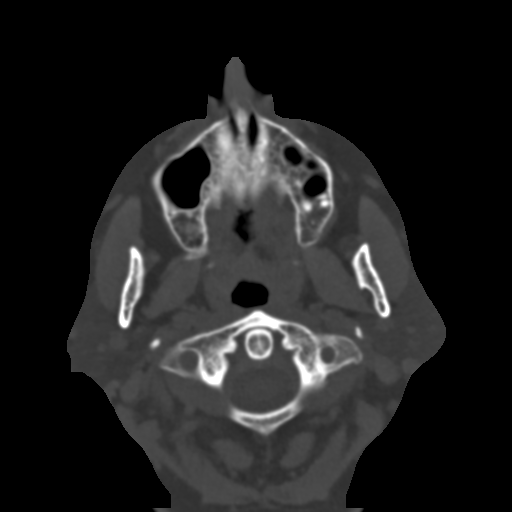
[im 48/93  bone]
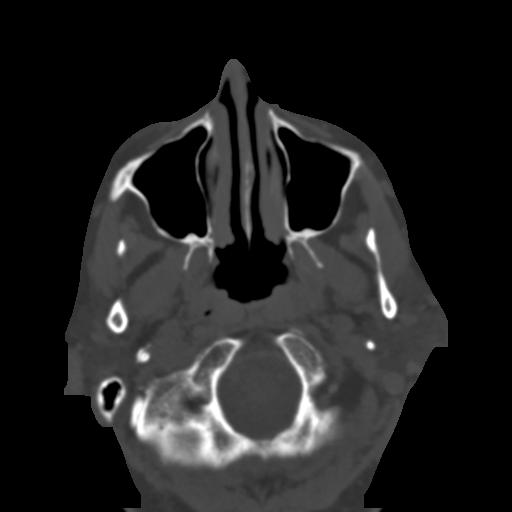
[im 51/93  brain]
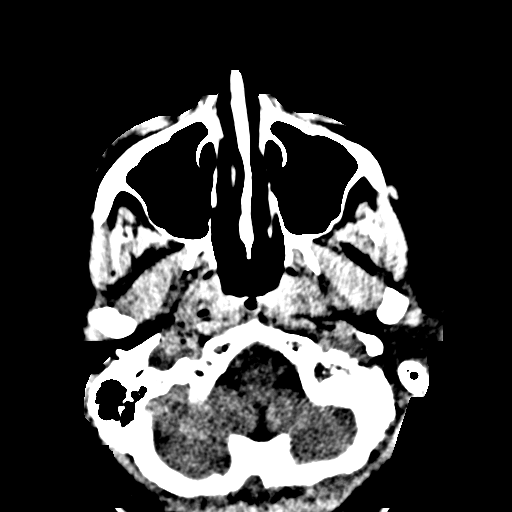
[im 51/93  bone]
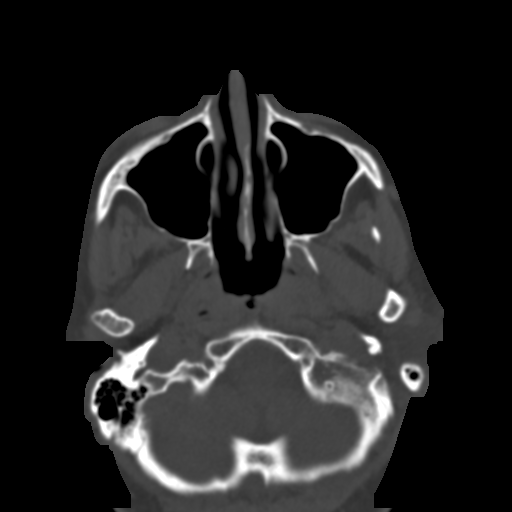
[im 58/93  bone]
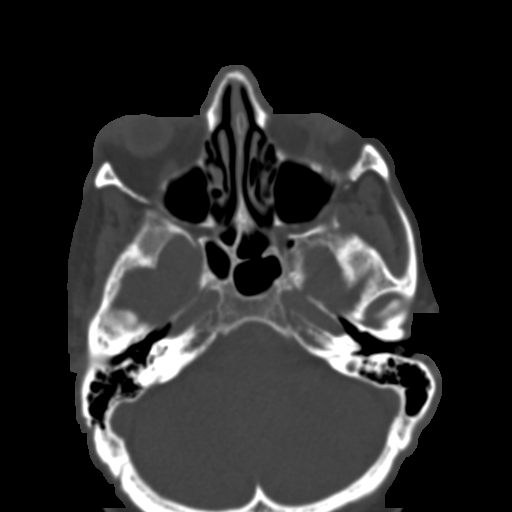
[im 64/93  bone]
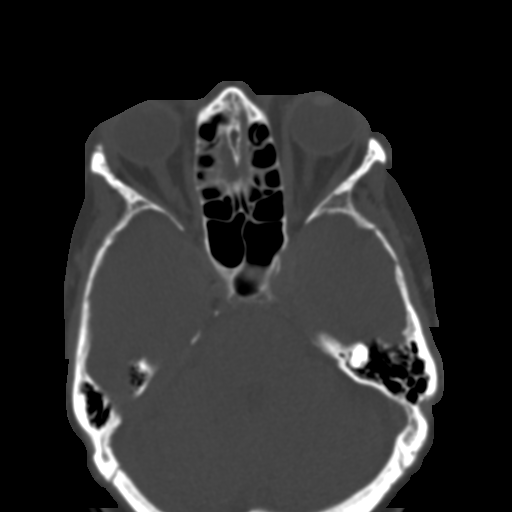
[im 70/93  bone]
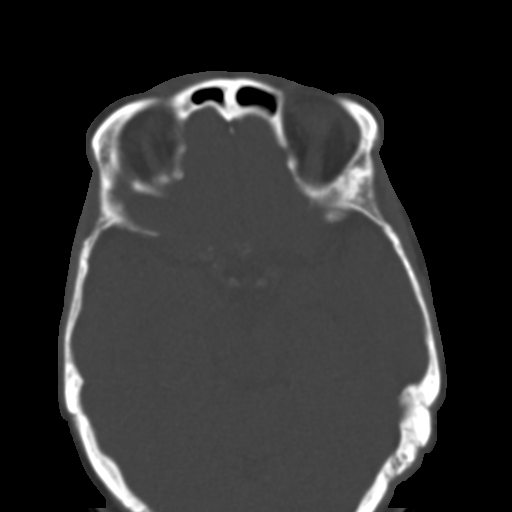
[im 77/93  brain]
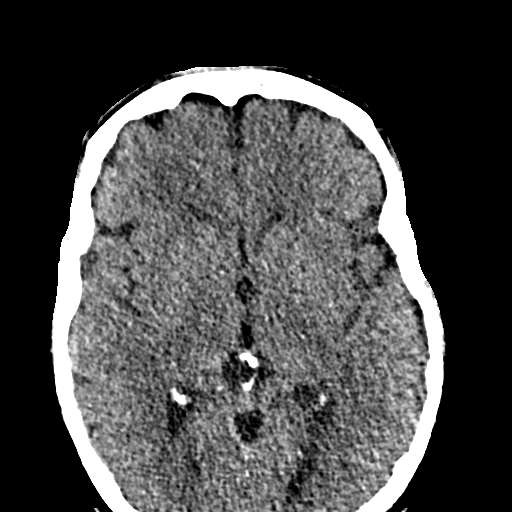
[im 77/93  bone]
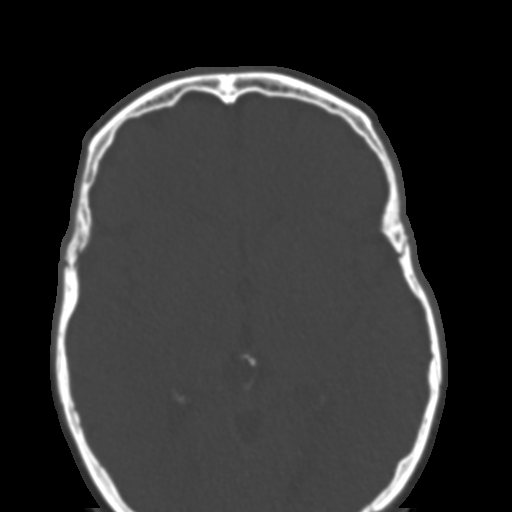
[im 83/93  bone]
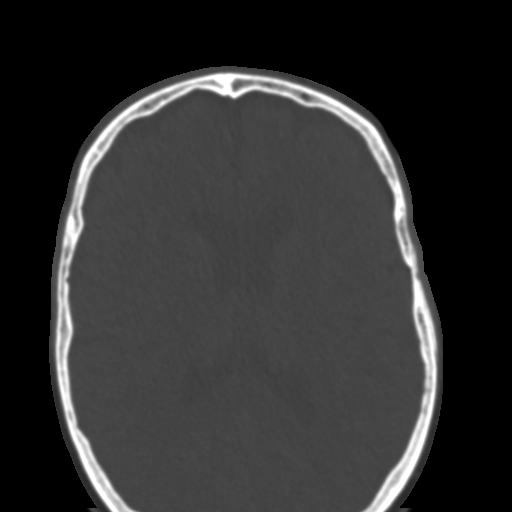
[im 89/93  bone]
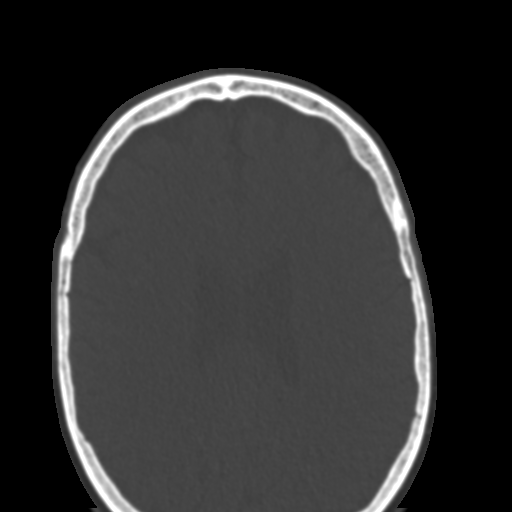

[15 of 30 positions shown; findings below may reference images not displayed]

FINDINGS: Osseous: Mildly displaced fracture of the right nasal bone (series
3, image 39). Redemonstrated chronic medially displaced fracture
deformity of the right lamina papyracea (series 6, image 30).

Orbits: No acute finding. The globes are normal in size and contour.
The extraocular muscles and optic nerve sheath complexes are
symmetric and unremarkable.

Sinuses: Trace mucosal thickening within the bilateral ethmoid and
right maxillary sinuses.

Soft tissues: Right greater than left forehead and periorbital soft
tissue swelling/hematoma. Nasal soft tissue swelling is also noted.

Limited intracranial: No evidence of acute intracranial abnormality
within the field of view.

Other: Cervical spondylosis. Most notably, there is advanced disc
degeneration with a posterior disc osteophyte and uncovertebral
hypertrophy at C5-C6.
IMPRESSION: Mildly displaced fracture of the right nasal bone.

Right greater than left forehead and periorbital soft tissue
swelling/hematoma. Nasal soft tissue swelling is also noted.

Redemonstrated chronic medially displaced fracture deformity of the
right lamina papyracea.

Minimal bilateral ethmoid and right maxillary sinus mucosal
thickening.

Incidentally noted cervical spondylosis, particularly at C5-C6.
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
## 2020-11-30 NOTE — ED Triage Notes (Signed)
Pt states she fell on a wooden deck about 2 days ago. She hit mainly her nose. She has briusing, swelling and abrasion on her face and head. She denies dizziness or nausea. She states she has chronic headache but nothing worse than her normal. She states she had drank champagne and was wearing heels.

## 2020-11-30 NOTE — Discharge Instructions (Addendum)
The CT scan shows a mildly displaced fracture of your nasal bone.  There is also an old fracture behind the right eye.  No other fractures noted.  You do have hematomas of your eyelids.  You should continue to ice your face every couple of hours for 10 to 15 minutes at a time.  This should help with swelling and bruising resolved.  Can also apply heat after 2 days to try to help the blood reabsorb.  Tylenol for pain.  You have declined a referral to ENT specialist about the nasal bone fracture.  Please follow-up with PCP if you have any ongoing issues related to the fall.  I do not have any concern for intracranial bleeding or abnormality.  However, if you have any severe headaches, vision changes, dizziness, vomiting, lethargy, numbness or tingling or weakness you should call 911 or have someone take her to the emergency room.

## 2020-12-02 ENCOUNTER — Other Ambulatory Visit: Payer: Self-pay | Admitting: Family Medicine

## 2020-12-02 ENCOUNTER — Ambulatory Visit: Payer: Medicare Other

## 2020-12-02 DIAGNOSIS — I10 Essential (primary) hypertension: Secondary | ICD-10-CM

## 2020-12-02 NOTE — Telephone Encounter (Signed)
Requested Prescriptions  Pending Prescriptions Disp Refills  . valsartan-hydrochlorothiazide (DIOVAN-HCT) 160-12.5 MG tablet [Pharmacy Med Name: VALSARTAN-HCTZ 160-12.5 MG TAB] 90 tablet 0    Sig: TAKE 1 TABLET BY MOUTH EVERY DAY     Cardiovascular: ARB + Diuretic Combos Failed - 12/02/2020  1:38 AM      Failed - K in normal range and within 180 days    Potassium  Date Value Ref Range Status  12/23/2019 4.9 3.5 - 5.3 mmol/L Final         Failed - Na in normal range and within 180 days    Sodium  Date Value Ref Range Status  12/23/2019 140 135 - 146 mmol/L Final  04/19/2015 140 134 - 144 mmol/L Final         Failed - Cr in normal range and within 180 days    Creat  Date Value Ref Range Status  12/23/2019 1.05 (H) 0.60 - 0.93 mg/dL Final    Comment:    For patients >52 years of age, the reference limit for Creatinine is approximately 13% higher for people identified as African-American. .          Failed - Ca in normal range and within 180 days    Calcium  Date Value Ref Range Status  12/23/2019 10.7 (H) 8.6 - 10.4 mg/dL Final         Failed - Last BP in normal range    BP Readings from Last 1 Encounters:  11/30/20 140/67         Passed - Patient is not pregnant      Passed - Valid encounter within last 6 months    Recent Outpatient Visits          5 months ago Essential hypertension   [facility] Steele Sizer, MD   11 months ago Essential hypertension   [facility] Steele Sizer, MD   1 year ago Essential hypertension   [facility] Steele Sizer, MD   1 year ago Daily headache   [facility] Steele Sizer, MD   1 year ago Essential hypertension   [facility] Steele Sizer, MD      Future Appointments            In 6 days Steele Sizer, MD [facility], [place]   In 3 weeks  [facility], [facility]

## 2020-12-07 DIAGNOSIS — H538 Other visual disturbances: Secondary | ICD-10-CM | POA: Diagnosis not present

## 2020-12-07 DIAGNOSIS — H9313 Tinnitus, bilateral: Secondary | ICD-10-CM | POA: Insufficient documentation

## 2020-12-07 DIAGNOSIS — I10 Essential (primary) hypertension: Secondary | ICD-10-CM | POA: Diagnosis not present

## 2020-12-07 DIAGNOSIS — R519 Headache, unspecified: Secondary | ICD-10-CM | POA: Diagnosis not present

## 2020-12-07 NOTE — Progress Notes (Signed)
Name: Hannah Lindsey   MRN: [id number]    DOB: 1945/12/04   Date:12/08/2020       Progress Note  Subjective  Chief Complaint  Follow Up  HPI  HTN:she is on Norvasc 10 mg and higher dose of Diovan 320/12.5, but bp is towards low end of normal, sometimes has dizziness, so we will decrease dose of diovan and monitor. She denies dizziness, chest pain or palpitation. BP is goal   Headache: still present, top of the head, intermittent, nortriptyline did not really help and caused constipation, so she stopped medication. It was worse at night, but more bothersome at night , does not wake her up from her sleep. Mild and throbbing like pain radiates from left  parietal area to her nose. She has mild tinnitus bilaterally going on for years . She states mother and two siblings with parkinson's disease. She denies any tremors or balance problems, but had a recent fall .  She was seen by neurologist , Dr. Melrose Nakayama, she was given Effexor two 75 mg daily , she states symptoms have improved   Hypercalcemia: she stopped taking otc calcium and labs normalized , we will recheck it today   Hyperlipidemia: she was taking Crestor last visit but LDL still above goal, she agrees on going up on dose.  No side effects of medications , she is due for repeat labs today   Osteopenia: low FRAX score, on high calcium diet and also taking vitamin D , reviewed last bone density   Perennial allergic rhinitis:she uses flonase prn for nasal congestion, she states this year has been worse, she has been developing hives intermittently   Recent Fall: she was at a party on 11/28/2020 , she states she fell forward and hit her head and nose on the ground, she was wearing high hills and not sure what happened. She had only drank one glass of champagne . She developed a large bruise and her husband and daughter advised her to go to Urgent care, CT showed mildly displaced , also found to have spondylosis of cervical spine. She is  doing better, still has some bruising, discussed fall prevention , discussed PT but she would like to hold off for now    Patient Active Problem List   Diagnosis Date Noted  . Senile purpura ([place]) 12/08/2020  . Perennial allergic rhinitis with seasonal variation 11/09/2017  . Osteopenia after menopause 11/09/2017  . Post-menopausal 09/06/2016  . Hyperlipidemia 06/19/2016  . Essential hypertension 06/04/2015    Past Surgical History:  Procedure Laterality Date  . COLONOSCOPY WITH PROPOFOL N/A 08/19/2018   Procedure: COLONOSCOPY WITH PROPOFOL;  Surgeon: Manya Silvas, MD;  Location: [facility] ENDOSCOPY;  Service: Endoscopy;  Laterality: N/A;  . TONSILLECTOMY      Family History  Problem Relation Age of Onset  . Fibromyalgia Mother   . Heart Problems Mother   . Dementia Father   . Heart Problems Father   . Heart disease Father   . Breast cancer Maternal Grandmother   . Cancer Maternal Grandmother   . Cancer Maternal Aunt   . Breast cancer Maternal Aunt     Social History   Tobacco Use  . Smoking status: Never Smoker  . Smokeless tobacco: Never Used  Substance Use Topics  . Alcohol use: Yes    Alcohol/week: 1.0 standard drink    Types: 1 Glasses of wine per week    Comment: nightly     Current Outpatient Medications:  .  amLODipine (  NORVASC) 10 MG tablet, TAKE 1 TABLET BY MOUTH EVERY DAY IN THE EVENING, Disp: 90 tablet, Rfl: 0 .  Ascorbic Acid (VITAMIN C) 1000 MG tablet, Take 1,000 mg by mouth daily., Disp: , Rfl:  .  Calcium Polycarbophil (FIBER-CAPS PO), Take 1 capsule by mouth as needed (Constipation)., Disp: , Rfl:  .  cholecalciferol (VITAMIN D3) 25 MCG (1000 UNIT) tablet, Take 2,000 Units by mouth daily., Disp: , Rfl:  .  fluticasone (FLONASE) 50 MCG/ACT nasal spray, Place 2 sprays into both nostrils daily., Disp: 16 g, Rfl: 2 .  rosuvastatin (CRESTOR) 20 MG tablet, TAKE 1 TABLET BY MOUTH EVERY DAY, Disp: 90 tablet, Rfl: 0 .  valsartan-hydrochlorothiazide  (DIOVAN-HCT) 160-12.5 MG tablet, TAKE 1 TABLET BY MOUTH EVERY DAY, Disp: 90 tablet, Rfl: 0 .  venlafaxine XR (EFFEXOR-XR) 37.5 MG 24 hr capsule, TAKE 1 CAP DAILY FOR ONE WEEK, THEN INCREASE TO 1 CAP TWICE PER DAY FOR HEADACHES, Disp: , Rfl:   No Known Allergies  I personally reviewed active problem list, medication list, allergies, family history, social history, health maintenance with the patient/caregiver today.   ROS  Constitutional: Negative for fever or weight change.  Respiratory: Negative for cough and shortness of breath.   Cardiovascular: Negative for chest pain or palpitations.  Gastrointestinal: Negative for abdominal pain, no bowel changes.  Musculoskeletal: Negative for gait problem or joint swelling.  Skin: Negative for rash.  Neurological: Negative for dizziness or headache.  No other specific complaints in a complete review of systems (except as listed in HPI above).  Objective  Vitals:   12/08/20 0806  BP: 110/66  Pulse: 86  Resp: 16  Temp: 98.1 F (36.7 C)  TempSrc: Oral  SpO2: 97%  Weight: 162 lb (73.5 kg)  Height: 5\' 5"  (1.651 m)    Body mass index is 26.96 kg/m.  Physical Exam  Constitutional: Patient appears well-developed and well-nourished. Overweight.  No distress.  HEENT: head atraumatic, normocephalic, pupils equal and reactive to light, ears normal TM bilaterally, neck supple, throat within normal limits Cardiovascular: Normal rate, regular rhythm and normal heart sounds.  No murmur heard. No BLE edema. Pulmonary/Chest: Effort normal and breath sounds normal. No respiratory distress. Abdominal: Soft.  There is no tenderness. Skin: raccoon eyes, also bruising on right cheek bridge of nose has a small erythematous area  Psychiatric: Patient has a normal mood and affect. behavior is normal. Judgment and thought content normal.  PHQ2/9: Depression screen [facility] 2/9 12/08/2020 06/08/2020 12/23/2019 11/18/2019 06/25/2019  Decreased Interest 0 0 0 0 0   Down, Depressed, Hopeless 0 0 0 0 0  PHQ - 2 Score 0 0 0 0 0  Altered sleeping - - 1 - 0  Tired, decreased energy - - 0 - 0  Change in appetite - - 0 - 0  Feeling bad or failure about yourself  - - 0 - 0  Trouble concentrating - - 0 - 0  Moving slowly or fidgety/restless - - 0 - 0  Suicidal thoughts - - 0 - 0  PHQ-9 Score - - 1 - 0  Difficult doing work/chores - - Not difficult at all - -  Some recent data might be hidden    phq 9 is negative   Fall Risk: Fall Risk  12/08/2020 06/08/2020 12/23/2019 11/18/2019 06/25/2019  Falls in the past year? 1 0 0 0 0  Number falls in past yr: 0 0 0 0 0  Injury with Fall? 1 0 0 0 0  Risk  for fall due to : - - - No Fall Risks -  Risk for fall due to: Comment - - - - -  Follow up - - - Falls prevention discussed -     Functional Status Survey: Is the patient deaf or have difficulty hearing?: No Does the patient have difficulty seeing, even when wearing glasses/contacts?: No Does the patient have difficulty concentrating, remembering, or making decisions?: No Does the patient have difficulty walking or climbing stairs?: No Does the patient have difficulty dressing or bathing?: No Does the patient have difficulty doing errands alone such as visiting a doctor's office or shopping?: No    Assessment & Plan  1. Senile purpura ([place])  Reassurance given  2. Pure hypercholesterolemia  - Lipid panel  3. Hyperglycemia  - Hemoglobin A1c  4. Vitamin D deficiency  - VITAMIN D 25 Hydroxy (Vit-D Deficiency, Fractures)  5. Osteopenia after menopause   6. Perennial allergic rhinitis with seasonal variation   7. Other insomnia  Snores but doing well at this time   8. Daily headache  Improved with Effexor   9. Long-term use of high-risk medication  - CBC with Differential/Platelet - COMPLETE METABOLIC PANEL WITH GFR  10. Spondylosis, cervical   11. Closed fracture of nasal bone with routine healing, subsequent encounter   12.  History of recent fall   13. Essential hypertension  - amLODipine (NORVASC) 10 MG tablet; Take 1 tablet (10 mg total) by mouth daily.  Dispense: 90 tablet; Refill: 1 - valsartan-hydrochlorothiazide (DIOVAN-HCT) 160-12.5 MG tablet; Take 1 tablet by mouth daily.  Dispense: 90 tablet; Refill: 1

## 2020-12-08 ENCOUNTER — Ambulatory Visit: Payer: Medicare Other | Admitting: Family Medicine

## 2020-12-08 ENCOUNTER — Encounter: Payer: Self-pay | Admitting: Family Medicine

## 2020-12-08 ENCOUNTER — Other Ambulatory Visit: Payer: Self-pay

## 2020-12-08 VITALS — BP 110/66 | HR 86 | Temp 98.1°F | Resp 16 | Ht 65.0 in | Wt 162.0 lb

## 2020-12-08 DIAGNOSIS — G4709 Other insomnia: Secondary | ICD-10-CM

## 2020-12-08 DIAGNOSIS — J3089 Other allergic rhinitis: Secondary | ICD-10-CM

## 2020-12-08 DIAGNOSIS — Z780 Asymptomatic menopausal state: Secondary | ICD-10-CM

## 2020-12-08 DIAGNOSIS — E7800 Pure hypercholesterolemia, unspecified: Secondary | ICD-10-CM | POA: Diagnosis not present

## 2020-12-08 DIAGNOSIS — I10 Essential (primary) hypertension: Secondary | ICD-10-CM

## 2020-12-08 DIAGNOSIS — M47812 Spondylosis without myelopathy or radiculopathy, cervical region: Secondary | ICD-10-CM

## 2020-12-08 DIAGNOSIS — M8580 Other specified disorders of bone density and structure, unspecified site: Secondary | ICD-10-CM | POA: Diagnosis not present

## 2020-12-08 DIAGNOSIS — S022XXD Fracture of nasal bones, subsequent encounter for fracture with routine healing: Secondary | ICD-10-CM | POA: Diagnosis not present

## 2020-12-08 DIAGNOSIS — R739 Hyperglycemia, unspecified: Secondary | ICD-10-CM

## 2020-12-08 DIAGNOSIS — Z9181 History of falling: Secondary | ICD-10-CM

## 2020-12-08 DIAGNOSIS — R519 Headache, unspecified: Secondary | ICD-10-CM

## 2020-12-08 DIAGNOSIS — D692 Other nonthrombocytopenic purpura: Secondary | ICD-10-CM | POA: Diagnosis not present

## 2020-12-08 DIAGNOSIS — Z79899 Other long term (current) drug therapy: Secondary | ICD-10-CM | POA: Diagnosis not present

## 2020-12-08 DIAGNOSIS — J302 Other seasonal allergic rhinitis: Secondary | ICD-10-CM

## 2020-12-08 DIAGNOSIS — E559 Vitamin D deficiency, unspecified: Secondary | ICD-10-CM | POA: Diagnosis not present

## 2020-12-08 MED ORDER — AMLODIPINE BESYLATE 10 MG PO TABS: 10.0000 mg | ORAL_TABLET | Freq: Every day | ORAL | 1 refills | Status: DC

## 2020-12-08 MED ORDER — VALSARTAN-HYDROCHLOROTHIAZIDE 160-12.5 MG PO TABS: 1.0000 | ORAL_TABLET | Freq: Every day | ORAL | 1 refills | Status: DC

## 2020-12-09 LAB — COMPLETE METABOLIC PANEL WITH GFR
AG Ratio: 2.2 (calc) (ref 1.0–2.5)
ALT: 28 U/L (ref 6–29)
AST: 27 U/L (ref 10–35)
Albumin: 5.2 g/dL — ABNORMAL HIGH (ref 3.6–5.1)
Alkaline phosphatase (APISO): 63 U/L (ref 37–153)
BUN: 16 mg/dL (ref 7–25)
CO2: 32 mmol/L (ref 20–32)
Calcium: 10.5 mg/dL — ABNORMAL HIGH (ref 8.6–10.4)
Chloride: 101 mmol/L (ref 98–110)
Creat: 0.81 mg/dL (ref 0.60–0.93)
GFR, Est African American: 83 mL/min/{1.73_m2} (ref >=60)
GFR, Est Non African American: 72 mL/min/{1.73_m2} (ref >=60)
Globulin: 2.4 g/dL (calc) (ref 1.9–3.7)
Glucose, Bld: 94 mg/dL (ref 65–99)
Potassium: 4.8 mmol/L (ref 3.5–5.3)
Sodium: 140 mmol/L (ref 135–146)
Total Bilirubin: 0.4 mg/dL (ref 0.2–1.2)
Total Protein: 7.6 g/dL (ref 6.1–8.1)

## 2020-12-09 LAB — CBC WITH DIFFERENTIAL/PLATELET
Absolute Monocytes: 502 cells/uL (ref 200–950)
Basophils Absolute: 34 cells/uL (ref 0–200)
Basophils Relative: 0.4 %
Eosinophils Absolute: 51 cells/uL (ref 15–500)
Eosinophils Relative: 0.6 %
HCT: 43.5 % (ref 35.0–45.0)
Hemoglobin: 14.8 g/dL (ref 11.7–15.5)
Lymphs Abs: 2159 cells/uL (ref 850–3900)
MCH: 30 pg (ref 27.0–33.0)
MCHC: 34 g/dL (ref 32.0–36.0)
MCV: 88.1 fL (ref 80.0–100.0)
MPV: 8.8 fL (ref 7.5–12.5)
Monocytes Relative: 5.9 %
Neutro Abs: 5755 cells/uL (ref 1500–7800)
Neutrophils Relative %: 67.7 %
Platelets: 180 10*3/uL (ref 140–400)
RBC: 4.94 10*6/uL (ref 3.80–5.10)
RDW: 12.4 % (ref 11.0–15.0)
Total Lymphocyte: 25.4 %
WBC: 8.5 10*3/uL (ref 3.8–10.8)

## 2020-12-09 LAB — LIPID PANEL
Cholesterol: 165 mg/dL (ref <200)
HDL: 60 mg/dL (ref >=50)
LDL Cholesterol (Calc): 83 mg/dL (calc)
Non-HDL Cholesterol (Calc): 105 mg/dL (calc) (ref <130)
Total CHOL/HDL Ratio: 2.8 (calc) (ref <5.0)
Triglycerides: 123 mg/dL (ref <150)

## 2020-12-09 LAB — HEMOGLOBIN A1C
Hgb A1c MFr Bld: 5.2 % of total Hgb (ref <5.7)
Mean Plasma Glucose: 103 mg/dL
eAG (mmol/L): 5.7 mmol/L

## 2020-12-09 LAB — VITAMIN D 25 HYDROXY (VIT D DEFICIENCY, FRACTURES): Vit D, 25-Hydroxy: 52 ng/mL (ref 30–100)

## 2020-12-23 ENCOUNTER — Other Ambulatory Visit: Payer: Self-pay

## 2020-12-23 ENCOUNTER — Ambulatory Visit (INDEPENDENT_AMBULATORY_CARE_PROVIDER_SITE_OTHER): Payer: Medicare Other

## 2020-12-23 VITALS — BP 122/72 | HR 91 | Temp 98.0°F | Resp 16 | Ht 65.0 in | Wt 162.7 lb

## 2020-12-23 DIAGNOSIS — Z1231 Encounter for screening mammogram for malignant neoplasm of breast: Secondary | ICD-10-CM | POA: Diagnosis not present

## 2020-12-23 DIAGNOSIS — Z0000 Encounter for general adult medical examination without abnormal findings: Secondary | ICD-10-CM

## 2020-12-23 NOTE — Progress Notes (Signed)
Subjective:   Hannah Lindsey is a 75 y.o. female who presents for Medicare Annual (Subsequent) preventive examination.  Review of Systems     Cardiac Risk Factors include: advanced age (>35men, >71 women);dyslipidemia;hypertension     Objective:    Today's Vitals   12/23/20 1124  BP: 122/72  Pulse: 91  Resp: 16  Temp: 98 F (36.7 C)  TempSrc: Oral  SpO2: 96%  Weight: 162 lb 11.2 oz (73.8 kg)  Height: 5\' 5"  (1.651 m)   Body mass index is 27.07 kg/m.  Advanced Directives 12/23/2020 11/30/2020 03/15/2020 11/18/2019 08/19/2018 11/09/2017 01/15/2017  Does Patient Have a Medical Advance Directive? Yes No Yes Yes No No No  Type of Paramedic of [place];Living will - [place];Living will [place];Living will - - -  Copy of [place] in Chart? No - copy requested - - No - copy requested - - -  Would patient like information on creating a medical advance directive? - - - - - Yes (MAU/Ambulatory/Procedural Areas - Information given) -    Current Medications (verified) Outpatient Encounter Medications as of 12/23/2020  Medication Sig  . amLODipine (NORVASC) 10 MG tablet Take 1 tablet (10 mg total) by mouth daily.  . Ascorbic Acid (VITAMIN C) 1000 MG tablet Take 1,000 mg by mouth daily.  . Calcium Polycarbophil (FIBER-CAPS PO) Take 1 capsule by mouth as needed (Constipation).  . cholecalciferol (VITAMIN D3) 25 MCG (1000 UNIT) tablet Take 2,000 Units by mouth daily.  . fluticasone (FLONASE) 50 MCG/ACT nasal spray Place 2 sprays into both nostrils daily.  Marland Kitchen loratadine (CLARITIN) 10 MG tablet Take 10 mg by mouth daily.  . rosuvastatin (CRESTOR) 20 MG tablet TAKE 1 TABLET BY MOUTH EVERY DAY  . valsartan-hydrochlorothiazide (DIOVAN-HCT) 160-12.5 MG tablet Take 1 tablet by mouth daily.  Marland Kitchen venlafaxine (EFFEXOR) 75 MG tablet Take 75 mg by mouth 2 (two) times daily.  . [DISCONTINUED] venlafaxine XR (EFFEXOR-XR)  37.5 MG 24 hr capsule Take 75 capsules by mouth daily.   No facility-administered encounter medications on file as of 12/23/2020.    Allergies (verified) Patient has no known allergies.   History: Past Medical History:  Diagnosis Date  . Allergy   . Anxiety, generalized   . Hyperglycemia   . Hyperlipidemia   . Hypertension    Past Surgical History:  Procedure Laterality Date  . COLONOSCOPY WITH PROPOFOL N/A 08/19/2018   Procedure: COLONOSCOPY WITH PROPOFOL;  Surgeon: Manya Silvas, MD;  Location: [facility] ENDOSCOPY;  Service: Endoscopy;  Laterality: N/A;  . TONSILLECTOMY     Family History  Problem Relation Age of Onset  . Fibromyalgia Mother   . Heart Problems Mother   . Dementia Father   . Heart Problems Father   . Heart disease Father   . Breast cancer Maternal Grandmother   . Cancer Maternal Grandmother   . Cancer Maternal Aunt   . Breast cancer Maternal Aunt    Social History   Socioeconomic History  . Marital status: Married    Spouse name: Jenny Reichmann  . Number of children: 1  . Years of education: Not on file  . Highest education level: Bachelor's degree (e.g., BA, AB, BS)  Occupational History  . Not on file  Tobacco Use  . Smoking status: Never Smoker  . Smokeless tobacco: Never Used  Vaping Use  . Vaping Use: Never used  Substance and Sexual Activity  . Alcohol use: Yes  Alcohol/week: 1.0 standard drink    Types: 1 Glasses of wine per week    Comment: nightly  . Drug use: No  . Sexual activity: Not Currently    Partners: Male    Birth control/protection: Post-menopausal  Other Topics Concern  . Not on file  Social History Narrative  . Not on file   Social Determinants of Health   Financial Resource Strain: Low Risk   . Difficulty of Paying Living Expenses: Not hard at all  Food Insecurity: No Food Insecurity  . Worried About Charity fundraiser in the Last Year: Never true  . Ran Out of Food in the Last Year: Never true  Transportation Needs:  No Transportation Needs  . Lack of Transportation (Medical): No  . Lack of Transportation (Non-Medical): No  Physical Activity: Sufficiently Active  . Days of Exercise per Week: 7 days  . Minutes of Exercise per Session: 30 min  Stress: No Stress Concern Present  . Feeling of Stress : Only a little  Social Connections: Moderately Integrated  . Frequency of Communication with Friends and Family: More than three times a week  . Frequency of Social Gatherings with Friends and Family: More than three times a week  . Attends Religious Services: Never  . Active Member of Clubs or Organizations: Yes  . Attends Archivist Meetings: More than 4 times per year  . Marital Status: Married    Tobacco Counseling Counseling given: Not Answered   Clinical Intake:  Pre-visit preparation completed: Yes  Pain : No/denies pain     BMI - recorded: 27.07 Nutritional Status: BMI 25 -29 Overweight Nutritional Risks: None Diabetes: No  How often do you need to have someone help you when you read instructions, pamphlets, or other written materials from your doctor or pharmacy?: 1 - Never    Interpreter Needed?: No  Information entered by :: Clemetine Marker LPN   Activities of Daily Living In your present state of health, do you have any difficulty performing the following activities: 12/23/2020 12/08/2020  Hearing? N N  Comment declines hearing aids -  Vision? N N  Difficulty concentrating or making decisions? N N  Walking or climbing stairs? N N  Dressing or bathing? N N  Doing errands, shopping? N N  Preparing Food and eating ? N -  Using the Toilet? N -  In the past six months, have you accidently leaked urine? N -  Do you have problems with loss of bowel control? N -  Managing your Medications? N -  Managing your Finances? N -  Housekeeping or managing your Housekeeping? N -  Some recent data might be hidden    Patient Care Team: Steele Sizer, MD as PCP - General  (Family Medicine) Anabel Bene, MD as Referring Physician (Neurology)  Indicate any recent Medical Services you may have received from other than [facility] providers in the past year (date may be approximate).     Assessment:   This is a routine wellness examination for Hannah Lindsey.  Hearing/Vision screen  Hearing Screening   125Hz  250Hz  500Hz  1000Hz  2000Hz  3000Hz  4000Hz  6000Hz  8000Hz   Right ear:           Left ear:           Comments: Pt denies hearing difficulty other than tinnitus   Vision Screening Comments: Vision screenings done at [facility] Dr. Wyatt Portela  Dietary issues and exercise activities discussed: Current Exercise Habits: Home exercise routine, Type of exercise: walking,  Time (Minutes): 30, Frequency (Times/Week): 7, Weekly Exercise (Minutes/Week): 210, Intensity: Mild, Exercise limited by: None identified  Goals Addressed            This Visit's Progress   . DIET - INCREASE WATER INTAKE   On track    Recommend to drink at least 6-8 8oz glasses of water per day.      Depression Screen PHQ 2/9 Scores 12/23/2020 12/08/2020 06/08/2020 12/23/2019 11/18/2019 06/25/2019 05/21/2019  PHQ - 2 Score 0 0 0 0 0 0 0  PHQ- 9 Score - - - 1 - 0 0    Fall Risk Fall Risk  12/23/2020 12/08/2020 06/08/2020 12/23/2019 11/18/2019  Falls in the past year? 1 1 0 0 0  Number falls in past yr: 0 0 0 0 0  Injury with Fall? 1 1 0 0 0  Risk for fall due to : History of fall(s) - - - No Fall Risks  Risk for fall due to: Comment - - - - -  Follow up Falls prevention discussed - - - Falls prevention discussed    FALL RISK PREVENTION PERTAINING TO THE HOME:  Any stairs in or around the home? Yes  If so, are there any without handrails? No  Home free of loose throw rugs in walkways, pet beds, electrical cords, etc? Yes  Adequate lighting in your home to reduce risk of falls? Yes   ASSISTIVE DEVICES UTILIZED TO PREVENT FALLS:  Life alert? No  Use of a cane, walker or w/c? No  Grab bars in the  bathroom? Yes  Shower chair or bench in shower? No  Elevated toilet seat or a handicapped toilet? No   TIMED UP AND GO:  Was the test performed? Yes .  Length of time to ambulate 10 feet: 4 sec.   Gait steady and fast without use of assistive device  Cognitive Function: Normal cognitive status assessed by direct observation by this Nurse Health Advisor. No abnormalities found.       6CIT Screen 11/18/2019 11/09/2017 05/29/2016  What Year? 0 points 0 points 0 points  What month? 0 points 0 points 0 points  What time? 0 points 0 points 0 points  Count back from 20 0 points 0 points 0 points  Months in reverse 0 points 0 points 0 points  Repeat phrase 0 points 2 points 2 points  Total Score 0 2 2    Immunizations Immunization History  Administered Date(s) Administered  . Fluad Quad(high Dose 65+) 05/14/2019, 06/08/2020  . Influenza, High Dose Seasonal PF 04/19/2015, 05/29/2016, 05/09/2017, 05/10/2018  . Influenza-Unspecified 04/07/2014  . PFIZER(Purple Top)SARS-COV-2 Vaccination 09/16/2019, 10/07/2019, 05/06/2020  . Pneumococcal Conjugate-13 10/08/2013  . Pneumococcal Polysaccharide-23 02/29/2012  . Tdap 02/29/2012  . Zoster 04/14/2013    TDAP status: Up to date  Flu Vaccine status: Up to date  Pneumococcal vaccine status: Up to date  Covid-19 vaccine status: Completed vaccines  Qualifies for Shingles Vaccine? Yes   Zostavax completed Yes   Shingrix Completed?: No.    Education has been provided regarding the importance of this vaccine. Patient has been advised to call insurance company to determine out of pocket expense if they have not yet received this vaccine. Advised may also receive vaccine at local pharmacy or Health Dept. Verbalized acceptance and understanding.  Screening Tests Health Maintenance  Topic Date Due  . MAMMOGRAM  12/21/2020  . INFLUENZA VACCINE  03/07/2021  . TETANUS/TDAP  02/28/2022  . COLONOSCOPY (Pts 45-59yrs Insurance coverage will need to  be confirmed)  08/21/2023  . DEXA SCAN  Completed  . COVID-19 Vaccine  Completed  . Hepatitis C Screening  Completed  . PNA vac Low Risk Adult  Completed  . HPV VACCINES  Aged Out    Health Maintenance  Health Maintenance Due  Topic Date Due  . MAMMOGRAM  12/21/2020    Colorectal cancer screening: Type of screening: Colonoscopy. Completed 08/20/18. Repeat every 25 years  Mammogram status: Completed 12/22/19. Repeat every year  Bone Density status: Completed 12/22/19. Results reflect: Bone density results: OSTEOPENIA. Repeat every 2 years.  Lung Cancer Screening: (Low Dose CT Chest recommended if Age 44-80 years, 30 pack-year currently smoking OR have quit w/in 15years.) does not qualify.   Additional Screening:  Hepatitis C Screening: does qualify; Completed 05/29/16  Vision Screening: Recommended annual ophthalmology exams for early detection of glaucoma and other disorders of the eye. Is the patient up to date with their annual eye exam?  Yes  Who is the provider or what is the name of the office in which the patient attends annual eye exams? Dr. Wyatt Portela.   Dental Screening: Recommended annual dental exams for proper oral hygiene  Community Resource Referral / Chronic Care Management: CRR required this visit?  No   CCM required this visit?  No      Plan:     I have personally reviewed and noted the following in the patient's chart:   . Medical and social history . Use of alcohol, tobacco or illicit drugs  . Current medications and supplements including opioid prescriptions.  . Functional ability and status . Nutritional status . Physical activity . Advanced directives . List of other physicians . Hospitalizations, surgeries, and ER visits in previous 12 months . Vitals . Screenings to include cognitive, depression, and falls . Referrals and appointments  In addition, I have reviewed and discussed with patient certain preventive protocols, quality metrics, and  best practice recommendations. A written personalized care plan for preventive services as well as general preventive health recommendations were provided to patient.     Clemetine Marker, LPN   [id number]   Nurse Notes: none

## 2020-12-23 NOTE — Patient Instructions (Signed)
Hannah Lindsey , Thank you for taking time to come for your Medicare Wellness Visit. I appreciate your ongoing commitment to your health goals. Please review the following plan we discussed and let me know if I can assist you in the future.   Screening recommendations/referrals: Colonoscopy: done 08/20/18. Repeat in 2025 Mammogram: done 12/22/19. Please call [phone] to schedule your mammogram.  Bone Density: done 12/22/19 Recommended yearly ophthalmology/optometry visit for glaucoma screening and checkup Recommended yearly dental visit for hygiene and checkup  Vaccinations: Influenza vaccine: done 06/08/20 Pneumococcal vaccine: done 10/08/13 Tdap vaccine: done 02/29/12 Shingles vaccine: Shingrix discussed. Please contact your pharmacy for coverage information.  Covid-19: done 09/16/19, 10/07/19 & 05/06/20  Advanced directives: Please bring a copy of your health care power of attorney and living will to the office at your convenience.  Conditions/risks identified: Keep up the great work!  Next appointment: Follow up in one year for your annual wellness visit    Preventive Care 65 Years and Older, Female Preventive care refers to lifestyle choices and visits with your health care provider that can promote health and wellness. What does preventive care include?  A yearly physical exam. This is also called an annual well check.  Dental exams once or twice a year.  Routine eye exams. Ask your health care provider how often you should have your eyes checked.  Personal lifestyle choices, including:  Daily care of your teeth and gums.  Regular physical activity.  Eating a healthy diet.  Avoiding tobacco and drug use.  Limiting alcohol use.  Practicing safe sex.  Taking low-dose aspirin every day.  Taking vitamin and mineral supplements as recommended by your health care provider. What happens during an annual well check? The services and screenings done by your health care provider  during your annual well check will depend on your age, overall health, lifestyle risk factors, and family history of disease. Counseling  Your health care provider may ask you questions about your:  Alcohol use.  Tobacco use.  Drug use.  Emotional well-being.  Home and relationship well-being.  Sexual activity.  Eating habits.  History of falls.  Memory and ability to understand (cognition).  Work and work Statistician.  Reproductive health. Screening  You may have the following tests or measurements:  Height, weight, and BMI.  Blood pressure.  Lipid and cholesterol levels. These may be checked every 5 years, or more frequently if you are over 52 years old.  Skin check.  Lung cancer screening. You may have this screening every year starting at age 47 if you have a 30-pack-year history of smoking and currently smoke or have quit within the past 15 years.  Fecal occult blood test (FOBT) of the stool. You may have this test every year starting at age 64.  Flexible sigmoidoscopy or colonoscopy. You may have a sigmoidoscopy every 5 years or a colonoscopy every 10 years starting at age 96.  Hepatitis C blood test.  Hepatitis B blood test.  Sexually transmitted disease (STD) testing.  Diabetes screening. This is done by checking your blood sugar (glucose) after you have not eaten for a while (fasting). You may have this done every 1-3 years.  Bone density scan. This is done to screen for osteoporosis. You may have this done starting at age 56.  Mammogram. This may be done every 1-2 years. Talk to your health care provider about how often you should have regular mammograms. Talk with your health care provider about your test results, treatment options,  and if necessary, the need for more tests. Vaccines  Your health care provider may recommend certain vaccines, such as:  Influenza vaccine. This is recommended every year.  Tetanus, diphtheria, and acellular pertussis  (Tdap, Td) vaccine. You may need a Td booster every 10 years.  Zoster vaccine. You may need this after age 51.  Pneumococcal 13-valent conjugate (PCV13) vaccine. One dose is recommended after age 3.  Pneumococcal polysaccharide (PPSV23) vaccine. One dose is recommended after age 39. Talk to your health care provider about which screenings and vaccines you need and how often you need them. This information is not intended to replace advice given to you by your health care provider. Make sure you discuss any questions you have with your health care provider. Document Released: 08/20/2015 Document Revised: 04/12/2016 Document Reviewed: 05/25/2015 Elsevier Interactive Patient Education  2017 [place] Prevention in the Home Falls can cause injuries. They can happen to people of all ages. There are many things you can do to make your home safe and to help prevent falls. What can I do on the outside of my home?  Regularly fix the edges of walkways and driveways and fix any cracks.  Remove anything that might make you trip as you walk through a door, such as a raised step or threshold.  Trim any bushes or trees on the path to your home.  Use bright outdoor lighting.  Clear any walking paths of anything that might make someone trip, such as rocks or tools.  Regularly check to see if handrails are loose or broken. Make sure that both sides of any steps have handrails.  Any raised decks and porches should have guardrails on the edges.  Have any leaves, snow, or ice cleared regularly.  Use sand or salt on walking paths during winter.  Clean up any spills in your garage right away. This includes oil or grease spills. What can I do in the bathroom?  Use night lights.  Install grab bars by the toilet and in the tub and shower. Do not use towel bars as grab bars.  Use non-skid mats or decals in the tub or shower.  If you need to sit down in the shower, use a plastic, non-slip  stool.  Keep the floor dry. Clean up any water that spills on the floor as soon as it happens.  Remove soap buildup in the tub or shower regularly.  Attach bath mats securely with double-sided non-slip rug tape.  Do not have throw rugs and other things on the floor that can make you trip. What can I do in the bedroom?  Use night lights.  Make sure that you have a light by your bed that is easy to reach.  Do not use any sheets or blankets that are too big for your bed. They should not hang down onto the floor.  Have a firm chair that has side arms. You can use this for support while you get dressed.  Do not have throw rugs and other things on the floor that can make you trip. What can I do in the kitchen?  Clean up any spills right away.  Avoid walking on wet floors.  Keep items that you use a lot in easy-to-reach places.  If you need to reach something above you, use a strong step stool that has a grab bar.  Keep electrical cords out of the way.  Do not use floor polish or wax that makes floors slippery. If  you must use wax, use non-skid floor wax.  Do not have throw rugs and other things on the floor that can make you trip. What can I do with my stairs?  Do not leave any items on the stairs.  Make sure that there are handrails on both sides of the stairs and use them. Fix handrails that are broken or loose. Make sure that handrails are as long as the stairways.  Check any carpeting to make sure that it is firmly attached to the stairs. Fix any carpet that is loose or worn.  Avoid having throw rugs at the top or bottom of the stairs. If you do have throw rugs, attach them to the floor with carpet tape.  Make sure that you have a light switch at the top of the stairs and the bottom of the stairs. If you do not have them, ask someone to add them for you. What else can I do to help prevent falls?  Wear shoes that:  Do not have high heels.  Have rubber bottoms.  Are  comfortable and fit you well.  Are closed at the toe. Do not wear sandals.  If you use a stepladder:  Make sure that it is fully opened. Do not climb a closed stepladder.  Make sure that both sides of the stepladder are locked into place.  Ask someone to hold it for you, if possible.  Clearly mark and make sure that you can see:  Any grab bars or handrails.  First and last steps.  Where the edge of each step is.  Use tools that help you move around (mobility aids) if they are needed. These include:  Canes.  Walkers.  Scooters.  Crutches.  Turn on the lights when you go into a dark area. Replace any light bulbs as soon as they burn out.  Set up your furniture so you have a clear path. Avoid moving your furniture around.  If any of your floors are uneven, fix them.  If there are any pets around you, be aware of where they are.  Review your medicines with your doctor. Some medicines can make you feel dizzy. This can increase your chance of falling. Ask your doctor what other things that you can do to help prevent falls. This information is not intended to replace advice given to you by your health care provider. Make sure you discuss any questions you have with your health care provider. Document Released: 05/20/2009 Document Revised: 12/30/2015 Document Reviewed: 08/28/2014 Elsevier Interactive Patient Education  2017 [employer].

## 2021-01-06 ENCOUNTER — Encounter: Payer: Self-pay | Admitting: Family Medicine

## 2021-01-06 DIAGNOSIS — J302 Other seasonal allergic rhinitis: Secondary | ICD-10-CM

## 2021-01-06 MED ORDER — FLUTICASONE PROPIONATE 50 MCG/ACT NA SUSP: 2.0000 | Freq: Every day | NASAL | 1 refills | Status: DC

## 2021-02-01 ENCOUNTER — Other Ambulatory Visit: Payer: Self-pay | Admitting: Family Medicine

## 2021-02-01 DIAGNOSIS — J3089 Other allergic rhinitis: Secondary | ICD-10-CM

## 2021-02-03 ENCOUNTER — Other Ambulatory Visit: Payer: Self-pay | Admitting: Family Medicine

## 2021-02-03 DIAGNOSIS — J3089 Other allergic rhinitis: Secondary | ICD-10-CM

## 2021-02-17 ENCOUNTER — Other Ambulatory Visit: Payer: Self-pay | Admitting: Family Medicine

## 2021-02-17 DIAGNOSIS — E7800 Pure hypercholesterolemia, unspecified: Secondary | ICD-10-CM

## 2021-03-16 ENCOUNTER — Encounter: Payer: Self-pay | Admitting: Family Medicine

## 2021-03-16 ENCOUNTER — Other Ambulatory Visit: Payer: Self-pay | Admitting: Family Medicine

## 2021-04-07 ENCOUNTER — Ambulatory Visit
Admission: RE | Admit: 2021-04-07 | Discharge: 2021-04-07 | Disposition: A | Discharge status: Home / Self Care | Payer: Medicare Other | Source: Ambulatory Visit | Attending: Family Medicine | Admitting: Family Medicine

## 2021-04-07 ENCOUNTER — Other Ambulatory Visit: Payer: Self-pay

## 2021-04-07 DIAGNOSIS — Z1231 Encounter for screening mammogram for malignant neoplasm of breast: Secondary | ICD-10-CM | POA: Insufficient documentation

## 2021-04-07 IMAGING — MG MM DIGITAL SCREENING BILAT W/ TOMO AND CAD
6 of 10 series · 6 of 30 positions shown · non-contrast
Comparison: Previous exam(s).

CLINICAL DATA: Screening.

EXAM:
DIGITAL SCREENING BILATERAL MAMMOGRAM WITH TOMOSYNTHESIS AND CAD
TECHNIQUE: Bilateral screening digital craniocaudal and mediolateral oblique
mammograms were obtained. Bilateral screening digital breast
tomosynthesis was performed. The images were evaluated with
computer-aided detection.

[R MLO synth-2D]
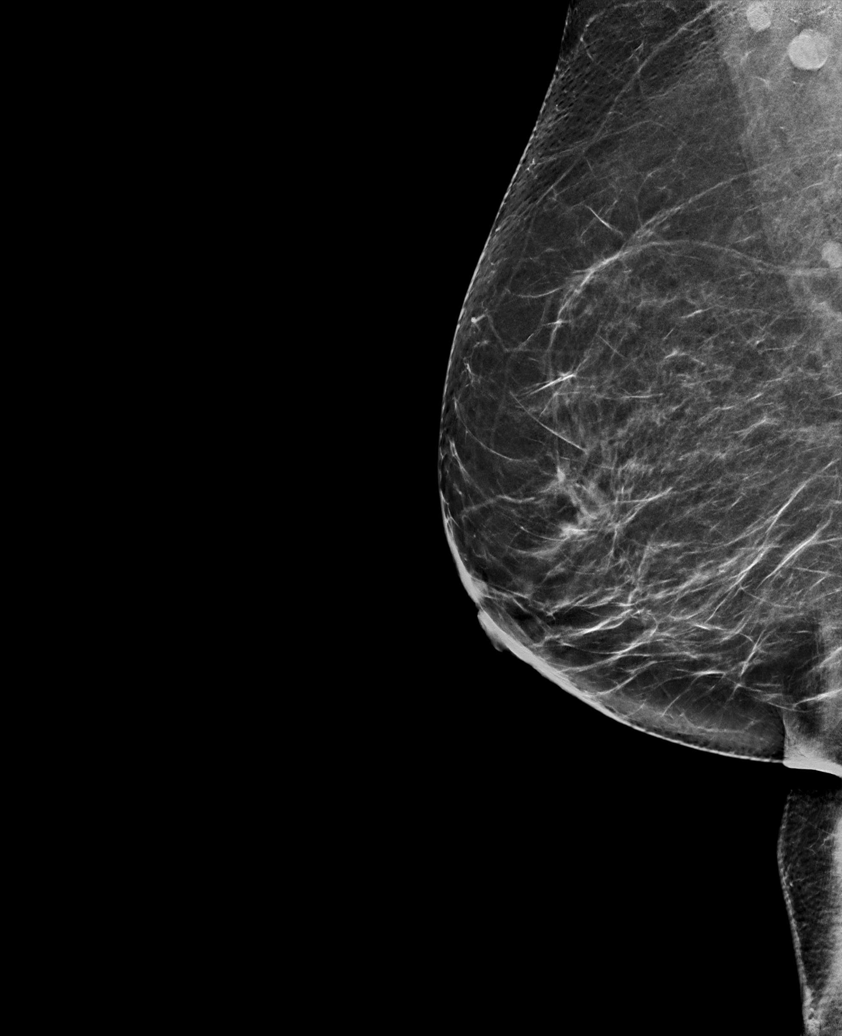

[L CC synth-2D]
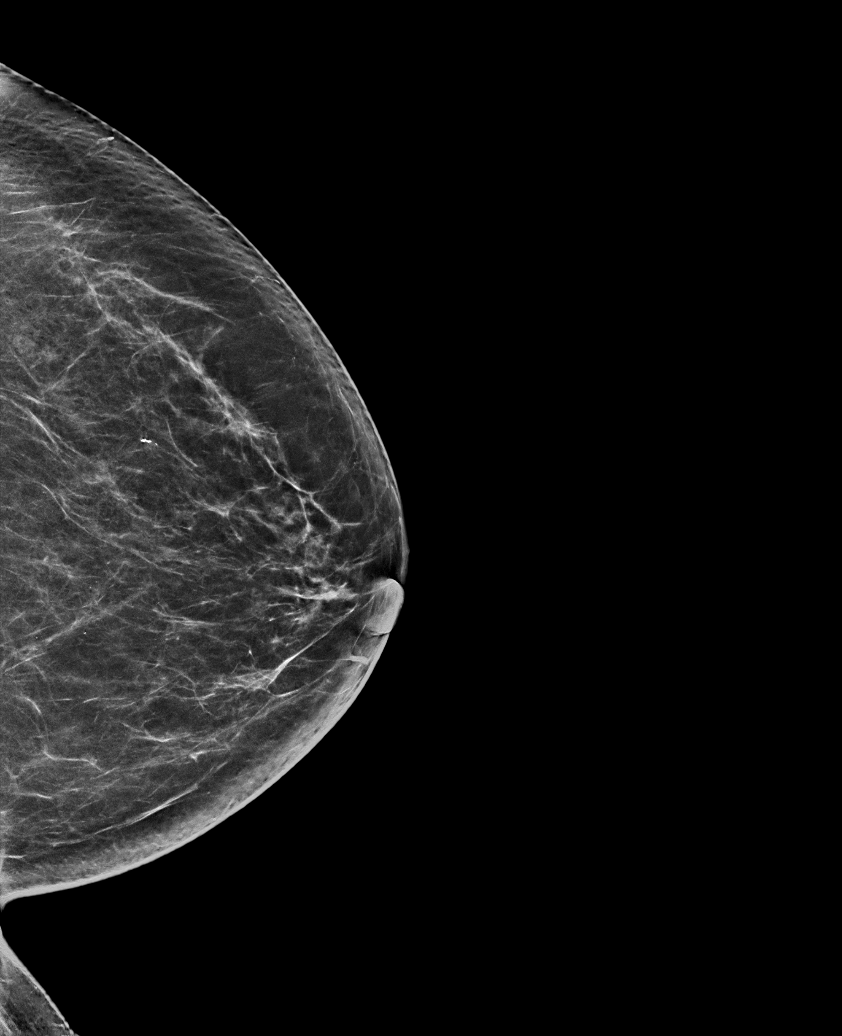

[R CC synth-2D]
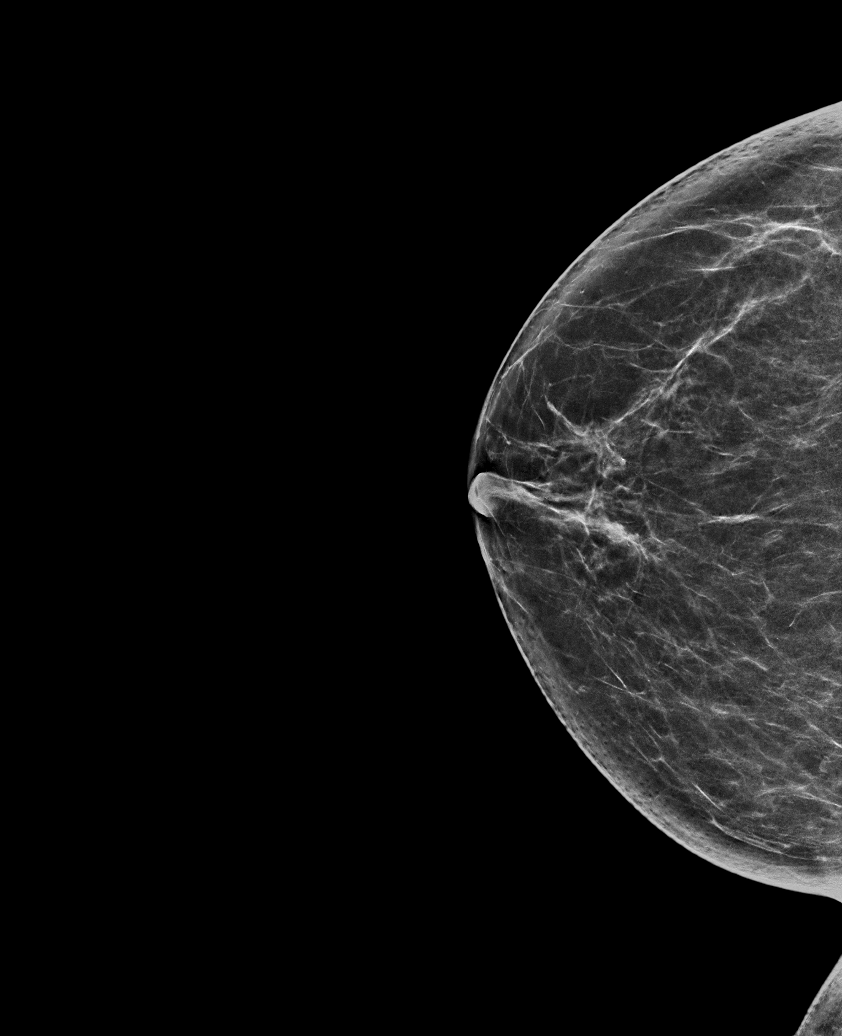

[L MLO synth-2D (1 of 2)]
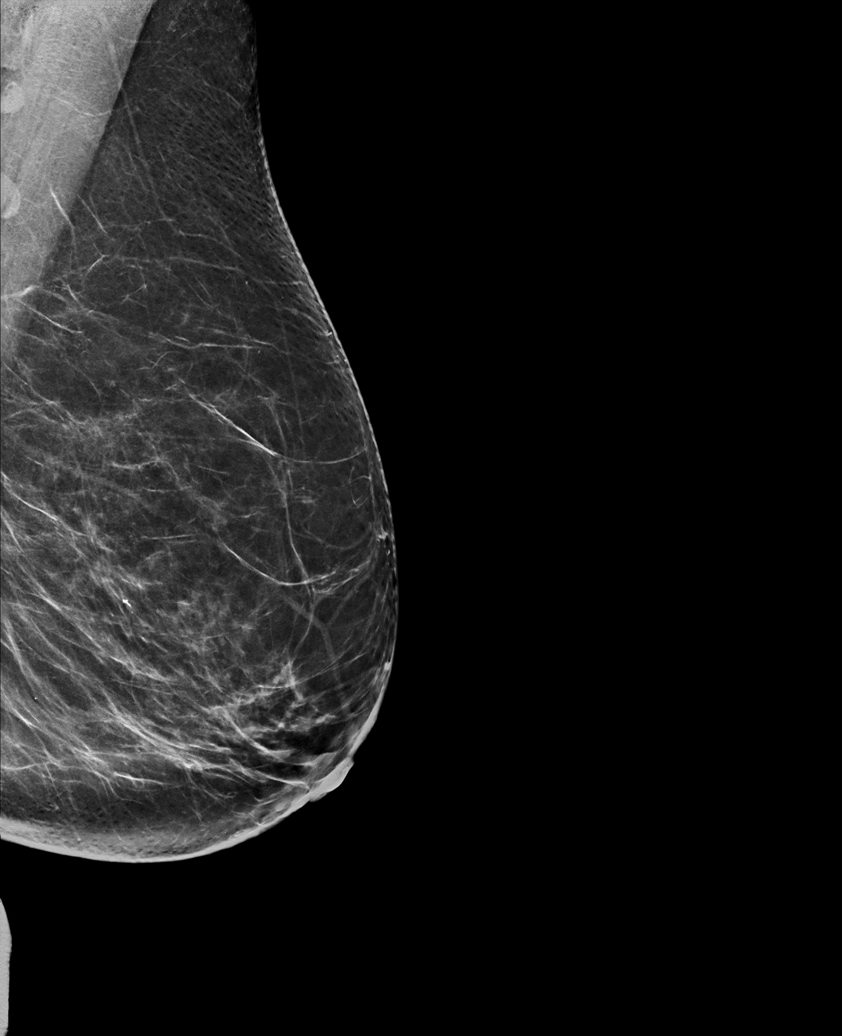

[L MLO synth-2D (2 of 2)]
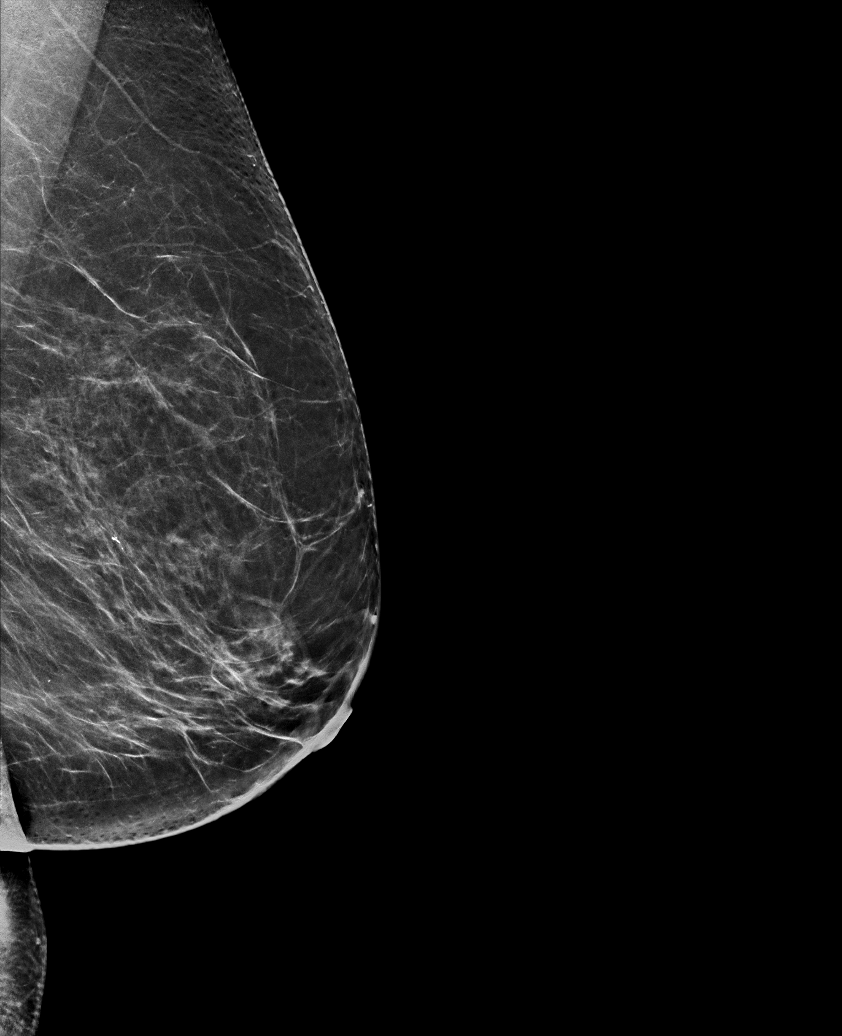

[L CC tomo · tomo slice 37/74.0]
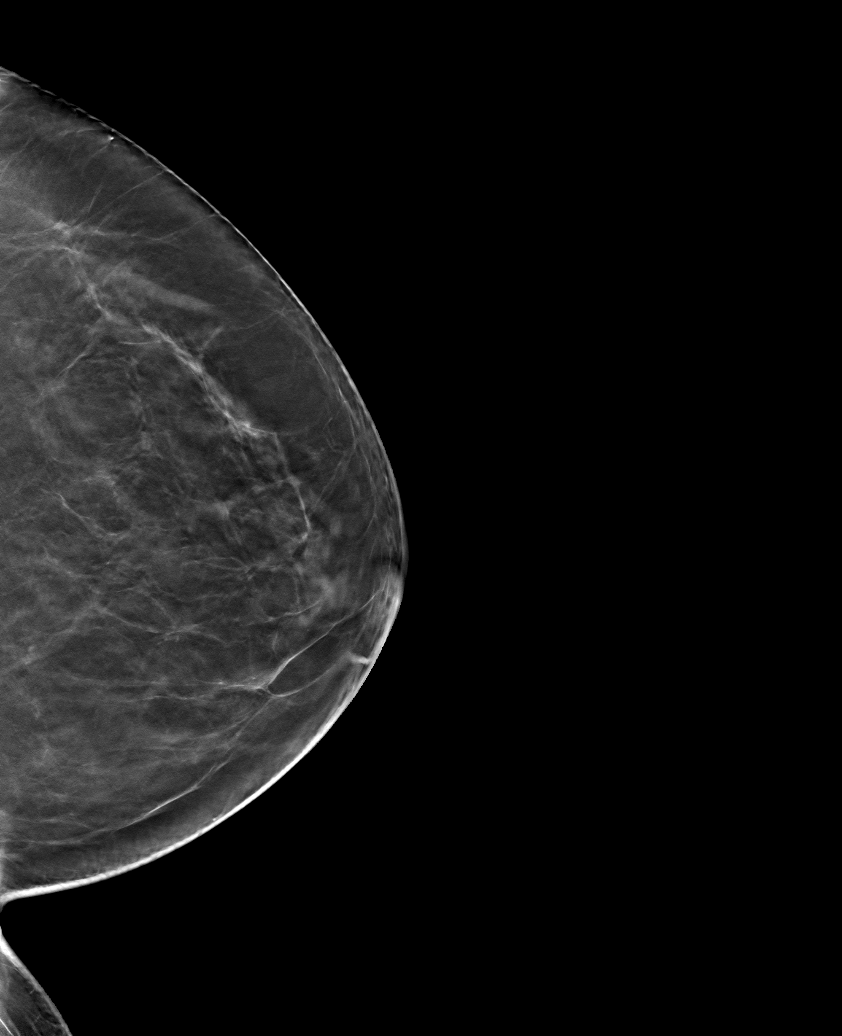

[6 of 30 positions shown; findings below may reference images not displayed]

ACR Breast Density Category b: There are scattered areas of
fibroglandular density.
FINDINGS: There are no findings suspicious for malignancy.
IMPRESSION: No mammographic evidence of malignancy. A result letter of this
screening mammogram will be mailed directly to the patient.

RECOMMENDATION:
Screening mammogram in one year. (Code:[BY])

BI-RADS CATEGORY  1: Negative.

## 2021-05-16 ENCOUNTER — Other Ambulatory Visit: Payer: Self-pay | Admitting: Family Medicine

## 2021-05-16 DIAGNOSIS — E7800 Pure hypercholesterolemia, unspecified: Secondary | ICD-10-CM

## 2021-06-08 DIAGNOSIS — I10 Essential (primary) hypertension: Secondary | ICD-10-CM | POA: Diagnosis not present

## 2021-06-08 DIAGNOSIS — H538 Other visual disturbances: Secondary | ICD-10-CM | POA: Diagnosis not present

## 2021-06-08 DIAGNOSIS — R519 Headache, unspecified: Secondary | ICD-10-CM | POA: Diagnosis not present

## 2021-06-08 DIAGNOSIS — H9313 Tinnitus, bilateral: Secondary | ICD-10-CM | POA: Diagnosis not present

## 2021-06-09 ENCOUNTER — Other Ambulatory Visit: Payer: Self-pay | Admitting: Internal Medicine

## 2021-06-09 DIAGNOSIS — Z006 Encounter for examination for normal comparison and control in clinical research program: Secondary | ICD-10-CM

## 2021-06-09 NOTE — Progress Notes (Signed)
Name: Hannah Lindsey   MRN: [id number]    DOB: 07/28/1946   Date:06/10/2021       Progress Note  Subjective  Chief Complaint  Follow Up  HPI  HTN:she is on Norvasc 10 mg and higher dose of Diovan 320/12.5, but is at goal . She denies dizziness, chest pain or palpitation. Continue current medication   Headache: still present, top of the head, intermittent, nortriptyline did not really help and caused constipation, so she stopped medication. It was worse at night, but more bothersome at night , does not wake her up from her sleep. Mild and throbbing like pain radiates from left  parietal area to her nose. She has mild tinnitus bilaterally going on for years . She states mother and two siblings with Parkinson's disease. Marland Kitchen  She was seen by neurologist , Dr. Melrose Nakayama, she was given Effexor two 75 mg daily , she states symptoms have improved and will have steroid injections soon .   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 We will send rx for baclofen   Lesion right ear lobe: going on for months we will refer her to dermatologist    Hyperlipidemia: she is taking Crestor and last LDL at goal, she is worried that Crestor is causing nuchal pain, but she also has DDD cervical spine. Discussed trying another statin but she wants to hold off for now.    Osteopenia: low FRAX score, not on calcium because of hypercalcemia but is taking vitamin D   Perennial allergic rhinitis: she uses flonase prn for nasal congestion, she states this year has been worse, she has been developing hives intermittently    Senile Purpura: doing better now, but still bruises on arms, reassurance given   Patient Active Problem List   Diagnosis Date Noted   Senile purpura ([place]) 12/08/2020   Tinnitus of both  ears 12/07/2020   Anxiety, generalized 10/20/2020   Chronic daily headache 08/22/2020   Perennial allergic rhinitis with seasonal variation 11/09/2017   Osteopenia after menopause 11/09/2017   Post-menopausal 09/06/2016   Hyperlipidemia 06/19/2016   Essential hypertension 06/04/2015    Past Surgical History:  Procedure Laterality Date   COLONOSCOPY WITH PROPOFOL N/A 08/19/2018   Procedure: COLONOSCOPY WITH PROPOFOL;  Surgeon: Manya Silvas, MD;  Location: [facility] ENDOSCOPY;  Service: Endoscopy;  Laterality: N/A;   TONSILLECTOMY      Family History  Problem Relation Age of Onset   Fibromyalgia Mother    Heart Problems Mother    Dementia Father    Heart Problems Father    Heart disease Father    Breast cancer Maternal Grandmother    Cancer Maternal Grandmother    Cancer Maternal Aunt    Breast cancer Maternal Aunt     Social History   Tobacco Use   Smoking status: Never   Smokeless tobacco: Never  Substance Use Topics   Alcohol use: Yes    Alcohol/week: 1.0 standard drink    Types: 1 Glasses of wine per week    Comment: nightly     Current Outpatient Medications:    amLODipine (NORVASC) 10 MG tablet, Take 1 tablet (10 mg total) by mouth daily., Disp: 90 tablet, Rfl: 1   fluticasone (FLONASE) 50 MCG/ACT nasal spray, SPRAY 2 SPRAYS INTO  EACH NOSTRIL EVERY DAY, Disp: 16 mL, Rfl: 1   loratadine (CLARITIN) 10 MG tablet, Take 10 mg by mouth daily., Disp: , Rfl:    rosuvastatin (CRESTOR) 20 MG tablet, TAKE 1 TABLET BY MOUTH EVERY DAY, Disp: 90 tablet, Rfl: 0   valsartan-hydrochlorothiazide (DIOVAN-HCT) 160-12.5 MG tablet, Take 1 tablet by mouth daily., Disp: 90 tablet, Rfl: 1   venlafaxine (EFFEXOR) 75 MG tablet, Take 75 mg by mouth 2 (two) times daily., Disp: , Rfl:   No Known Allergies  I personally reviewed active problem list, medication list, allergies, family history, social history, health maintenance with the patient/caregiver today.   ROS  Constitutional:  Negative for fever or weight change.  Respiratory: Negative for cough and shortness of breath.   Cardiovascular: Negative for chest pain or palpitations.  Gastrointestinal: Negative for abdominal pain, no bowel changes.  Musculoskeletal: Negative for gait problem or joint swelling.  Skin: Negative for rash.  Neurological: Negative for dizziness , positive for headache.  No other specific complaints in a complete review of systems (except as listed in HPI above).   Objective  Vitals:   06/10/21 0832  BP: 122/68  Pulse: 93  Resp: 16  Temp: 98.2 F (36.8 C)  SpO2: 97%  Weight: 162 lb (73.5 kg)  Height: 5\' 5"  (1.651 m)    Body mass index is 26.96 kg/m.  Physical Exam  Constitutional: Patient appears well-developed and well-nourished. Overweight.  No distress.  HEENT: head atraumatic, normocephalic, pupils equal and reactive to light, neck supple Cardiovascular: Normal rate, regular rhythm and normal heart sounds.  No murmur heard. No BLE edema. Pulmonary/Chest: Effort normal and breath sounds normal. No respiratory distress. Abdominal: Soft.  There is no tenderness. Psychiatric: Patient has a normal mood and affect. behavior is normal. Judgment and thought content normal.  Skin: ulceration right ear lobe    PHQ2/9: Depression screen [facility] 2/9 06/10/2021 12/23/2020 12/08/2020 06/08/2020 12/23/2019  Decreased Interest 0 0 0 0 0  Down, Depressed, Hopeless 0 0 0 0 0  PHQ - 2 Score 0 0 0 0 0  Altered sleeping 0 - - - 1  Tired, decreased energy 0 - - - 0  Change in appetite 0 - - - 0  Feeling bad or failure about yourself  0 - - - 0  Trouble concentrating 0 - - - 0  Moving slowly or fidgety/restless 0 - - - 0  Suicidal thoughts 0 - - - 0  PHQ-9 Score 0 - - - 1  Difficult doing work/chores - - - - Not difficult at all  Some recent data might be hidden    phq 9 is negative   Fall Risk: Fall Risk  06/10/2021 12/23/2020 12/08/2020 06/08/2020 12/23/2019  Falls in the past year? 1 1 1  0 0   Number falls in past yr: 0 0 0 0 0  Injury with Fall? 1 1 1  0 0  Risk for fall due to : No Fall Risks History of fall(s) - - -  Risk for fall due to: Comment - - - - -  Follow up Falls prevention discussed Falls prevention discussed - - -      Functional Status Survey: Is the patient deaf or have difficulty hearing?: No Does the patient have difficulty seeing, even when wearing glasses/contacts?: No Does the patient have difficulty concentrating, remembering, or making decisions?: No Does the patient have difficulty walking or climbing stairs?: No Does the patient have difficulty dressing or bathing?: No Does the patient  have difficulty doing errands alone such as visiting a doctor's office or shopping?: No    Assessment & Plan  1. Senile purpura ([facility])  Stable on arms   2. Perennial allergic rhinitis with seasonal variation   3. Vitamin D deficiency   4. Hyperglycemia   5. Need for immunization against influenza  - Flu Vaccine QUAD High Dose(Fluad)  6. Pure hypercholesterolemia  - rosuvastatin (CRESTOR) 20 MG tablet; Take 1 tablet (20 mg total) by mouth daily.  Dispense: 90 tablet; Refill: 1  7. Osteopenia after menopause   8. Spondylosis, cervical  - baclofen (LIORESAL) 10 MG tablet; Take 1 tablet (10 mg total) by mouth daily as needed for muscle spasms.  Dispense: 90 each; Refill: 0 - diclofenac Sodium (VOLTAREN) 1 % GEL; Apply 2 g topically 4 (four) times daily.  Dispense: 100 g; Refill: 1  9. Skin lesion of right ear  - Ambulatory referral to Dermatology  10. Seborrhea capitis  - Ambulatory referral to Dermatology  11. Essential hypertension  - valsartan-hydrochlorothiazide (DIOVAN-HCT) 160-12.5 MG tablet; Take 1 tablet by mouth daily.  Dispense: 90 tablet; Refill: 1 - amLODipine (NORVASC) 10 MG tablet; Take 1 tablet (10 mg total) by mouth daily.  Dispense: 90 tablet; Refill: 1  12. Neck muscle spasm  - baclofen (LIORESAL) 10 MG tablet; Take 1  tablet (10 mg total) by mouth daily as needed for muscle spasms.  Dispense: 90 each; Refill: 0

## 2021-06-10 ENCOUNTER — Ambulatory Visit (INDEPENDENT_AMBULATORY_CARE_PROVIDER_SITE_OTHER): Payer: Medicare Other | Admitting: Family Medicine

## 2021-06-10 ENCOUNTER — Other Ambulatory Visit: Payer: Self-pay

## 2021-06-10 ENCOUNTER — Encounter: Payer: Self-pay | Admitting: Family Medicine

## 2021-06-10 VITALS — BP 122/68 | HR 93 | Temp 98.2°F | Resp 16 | Ht 65.0 in | Wt 162.0 lb

## 2021-06-10 DIAGNOSIS — M47812 Spondylosis without myelopathy or radiculopathy, cervical region: Secondary | ICD-10-CM

## 2021-06-10 DIAGNOSIS — D692 Other nonthrombocytopenic purpura: Secondary | ICD-10-CM | POA: Diagnosis not present

## 2021-06-10 DIAGNOSIS — E7800 Pure hypercholesterolemia, unspecified: Secondary | ICD-10-CM | POA: Diagnosis not present

## 2021-06-10 DIAGNOSIS — J3089 Other allergic rhinitis: Secondary | ICD-10-CM

## 2021-06-10 DIAGNOSIS — R739 Hyperglycemia, unspecified: Secondary | ICD-10-CM | POA: Diagnosis not present

## 2021-06-10 DIAGNOSIS — M62838 Other muscle spasm: Secondary | ICD-10-CM

## 2021-06-10 DIAGNOSIS — Z23 Encounter for immunization: Secondary | ICD-10-CM | POA: Diagnosis not present

## 2021-06-10 DIAGNOSIS — L210 Seborrhea capitis: Secondary | ICD-10-CM | POA: Diagnosis not present

## 2021-06-10 DIAGNOSIS — M8580 Other specified disorders of bone density and structure, unspecified site: Secondary | ICD-10-CM | POA: Diagnosis not present

## 2021-06-10 DIAGNOSIS — E559 Vitamin D deficiency, unspecified: Secondary | ICD-10-CM | POA: Diagnosis not present

## 2021-06-10 DIAGNOSIS — I10 Essential (primary) hypertension: Secondary | ICD-10-CM | POA: Diagnosis not present

## 2021-06-10 DIAGNOSIS — H6191 Disorder of right external ear, unspecified: Secondary | ICD-10-CM | POA: Diagnosis not present

## 2021-06-10 DIAGNOSIS — J302 Other seasonal allergic rhinitis: Secondary | ICD-10-CM

## 2021-06-10 DIAGNOSIS — Z780 Asymptomatic menopausal state: Secondary | ICD-10-CM

## 2021-06-10 MED ORDER — BACLOFEN 10 MG PO TABS: 10.0000 mg | ORAL_TABLET | Freq: Every day | ORAL | 0 refills | Status: DC | PRN

## 2021-06-10 MED ORDER — VALSARTAN-HYDROCHLOROTHIAZIDE 160-12.5 MG PO TABS: 1.0000 | ORAL_TABLET | Freq: Every day | ORAL | 1 refills | Status: DC

## 2021-06-10 MED ORDER — DICLOFENAC SODIUM 1 % EX GEL: 2.0000 g | Freq: Four times a day (QID) | CUTANEOUS | 1 refills | Status: DC

## 2021-06-10 MED ORDER — AMLODIPINE BESYLATE 10 MG PO TABS: 10.0000 mg | ORAL_TABLET | Freq: Every day | ORAL | 1 refills | Status: DC

## 2021-06-10 MED ORDER — ROSUVASTATIN CALCIUM 20 MG PO TABS: 20.0000 mg | ORAL_TABLET | Freq: Every day | ORAL | 1 refills | Status: DC

## 2021-06-15 ENCOUNTER — Other Ambulatory Visit: Payer: Medicare Other

## 2021-07-03 ENCOUNTER — Other Ambulatory Visit: Payer: Self-pay

## 2021-07-03 ENCOUNTER — Ambulatory Visit
Admission: RE | Admit: 2021-07-03 | Discharge: 2021-07-03 | Disposition: A | Discharge status: Home / Self Care | Payer: Medicare Other | Source: Ambulatory Visit | Attending: Internal Medicine | Admitting: Internal Medicine

## 2021-07-03 DIAGNOSIS — Z006 Encounter for examination for normal comparison and control in clinical research program: Secondary | ICD-10-CM

## 2021-07-05 ENCOUNTER — Other Ambulatory Visit: Payer: Self-pay

## 2021-07-05 ENCOUNTER — Ambulatory Visit
Admission: RE | Admit: 2021-07-05 | Discharge: 2021-07-05 | Disposition: A | Discharge status: Home / Self Care | Payer: No Typology Code available for payment source | Source: Ambulatory Visit | Attending: Internal Medicine | Admitting: Internal Medicine

## 2021-07-05 IMAGING — MR MR HEAD W/O CM
5 series · 48 of 48 positions shown · non-contrast
Comparison: Head CT [DATE].  Maxillofacial CT [DATE].

CLINICAL DATA: Clinical research study.  Alzheimer's.

EXAM:
MRI HEAD WITHOUT CONTRAST
TECHNIQUE: Multiplanar, multiecho pulse sequences of the brain and surrounding
structures were obtained without intravenous contrast. A research
protocol was used consisting of FLAIR, T2, and T2* gradient echo
sequences in the axial plane as well as a sagittal T1 sequence. An
ADC map was also submitted, however a trace diffusion sequence was
not provided.

[Series 2: 3dt1 sag · sagittal · 1.2mm · 1.25mm/px · 29 of 176 slices shown]
[im 1/176]
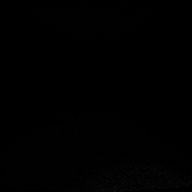
[im 7/176]
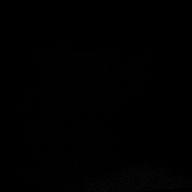
[im 13/176]
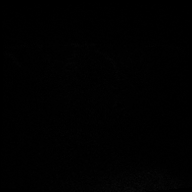
[im 19/176]
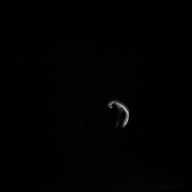
[im 26/176]
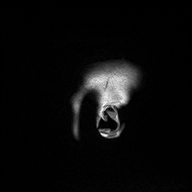
[im 32/176]
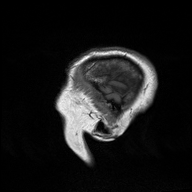
[im 38/176]
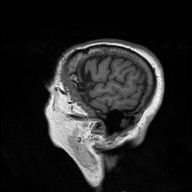
[im 44/176]
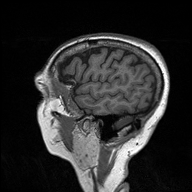
[im 51/176]
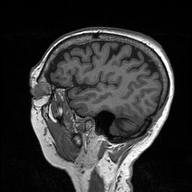
[im 57/176]
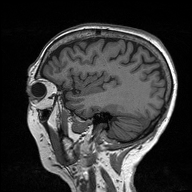
[im 63/176]
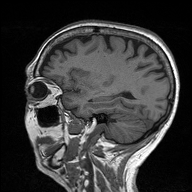
[im 69/176]
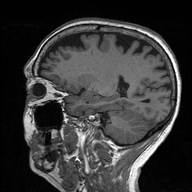
[im 76/176]
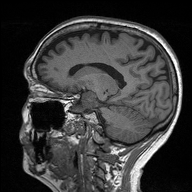
[im 82/176]
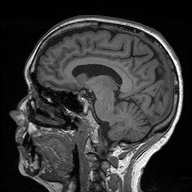
[im 88/176]
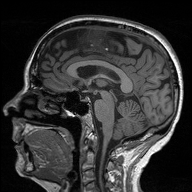
[im 94/176]
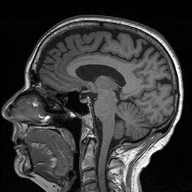
[im 101/176]
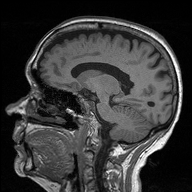
[im 107/176]
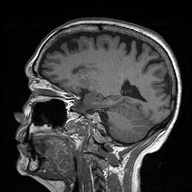
[im 113/176]
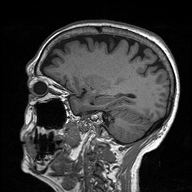
[im 119/176]
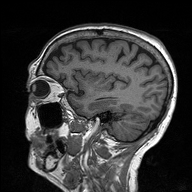
[im 126/176]
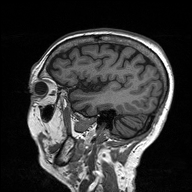
[im 132/176]
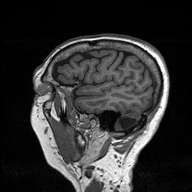
[im 138/176]
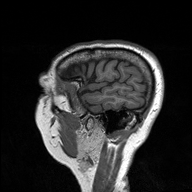
[im 144/176]
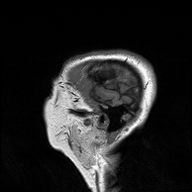
[im 151/176]
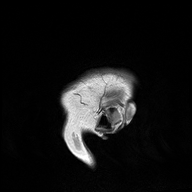
[im 157/176]
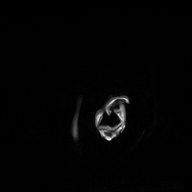
[im 163/176]
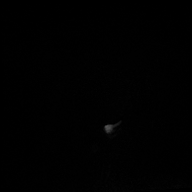
[im 169/176]
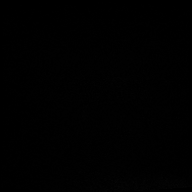
[im 176/176]
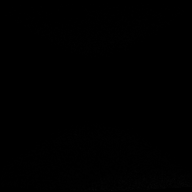

[Series 4: FLAIR · axial · 5.0mm · 0.94mm/px · z∈[-37,+99]mm · 4 of 27 slices shown]
[im 1/27]
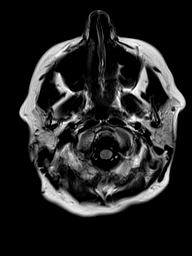
[im 9/27]
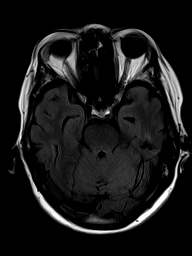
[im 18/27]
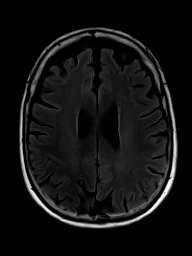
[im 27/27]
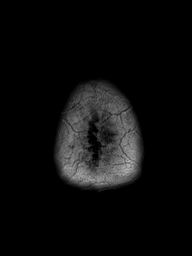

[Series 5: T2 · axial · 5.0mm · 0.94mm/px · z∈[-37,+100]mm · 5 of 27 slices shown]
[im 1/27]
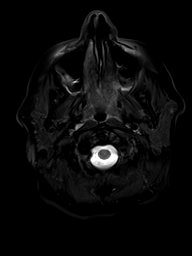
[im 7/27]
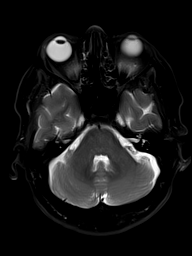
[im 14/27]
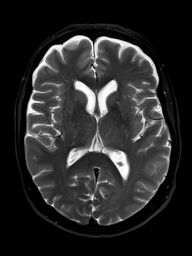
[im 20/27]
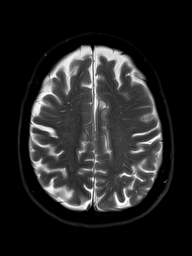
[im 27/27]
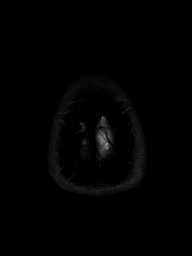

[Series 6: T2-star · axial · 5.0mm · 0.94mm/px · z∈[-37,+100]mm · 5 of 27 slices shown]
[im 1/27]
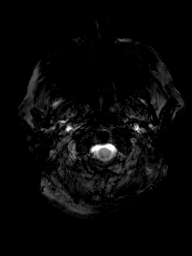
[im 7/27]
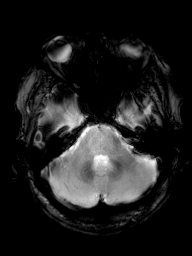
[im 14/27]
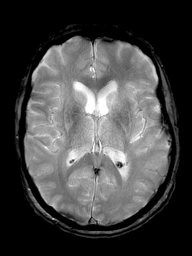
[im 20/27]
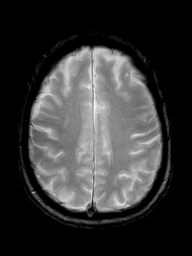
[im 27/27]
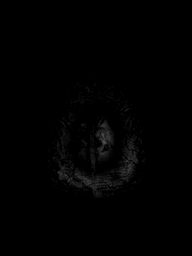

[Series 9: dwi_adc · axial · 5.0mm · 0.94mm/px · z∈[-38,+99]mm · 5 of 27 slices shown]
[im 1/27]
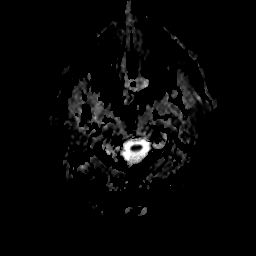
[im 7/27]
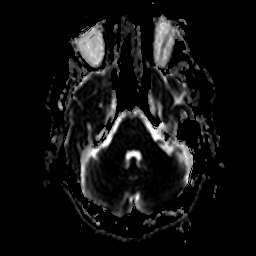
[im 14/27]
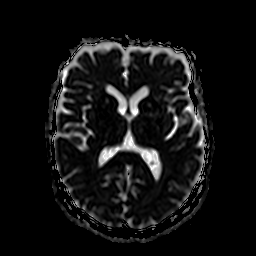
[im 20/27]
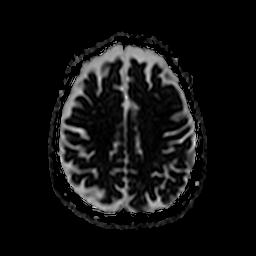
[im 27/27]
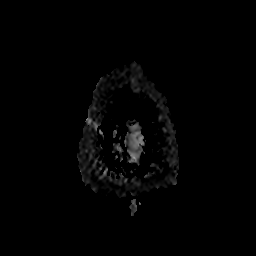

[48 of 48 positions shown; findings below may reference images not displayed]

FINDINGS: Brain: No sizable acute infarct, intracranial hemorrhage, mass,
midline shift, or extra-axial fluid collection is identified on this
limited research protocol. Mild cerebral atrophy is within normal
limits for age. No significant white matter disease is seen.

Vascular: Major intracranial vascular flow voids are preserved.

Skull and upper cervical spine: Unremarkable bone marrow signal.

Sinuses/Orbits: Unremarkable orbits. Paranasal sinuses and mastoid
air cells are clear.

Other: None.
IMPRESSION: Unremarkable appearance of the brain for age on this limited
research protocol.

## 2021-07-05 IMAGING — MR MR HEAD W/O CM
3 series · 48 of 48 positions shown · non-contrast
Comparison: Head CT [DATE].  Maxillofacial CT [DATE].

CLINICAL DATA: Clinical research study.  Alzheimer's.

EXAM:
MRI HEAD WITHOUT CONTRAST
TECHNIQUE: Multiplanar, multiecho pulse sequences of the brain and surrounding
structures were obtained without intravenous contrast. A research
protocol was used consisting of FLAIR, T2, and T2* gradient echo
sequences in the axial plane as well as a sagittal T1 sequence. An
ADC map was also submitted, however a trace diffusion sequence was
not provided.

[Series 7: DWI · axial · 5.0mm · 0.94mm/px · z∈[-38,+99]mm · 27 of 108 slices shown]
[im 1/108]
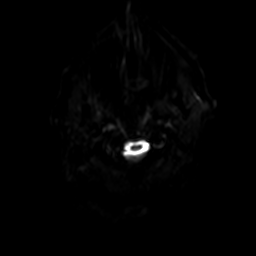
[im 5/108]
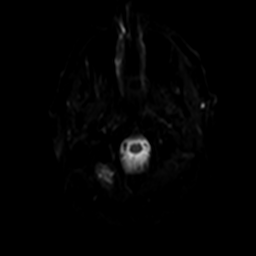
[im 9/108]
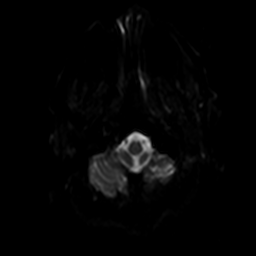
[im 13/108]
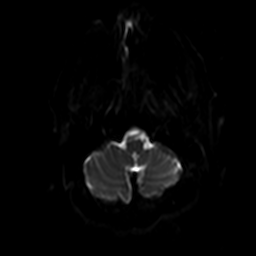
[im 17/108]
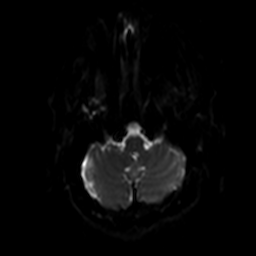
[im 21/108]
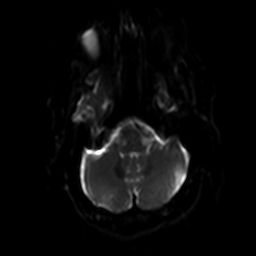
[im 25/108]
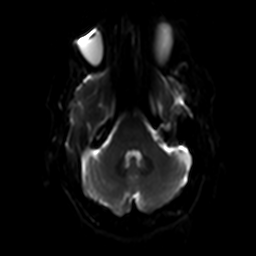
[im 29/108]
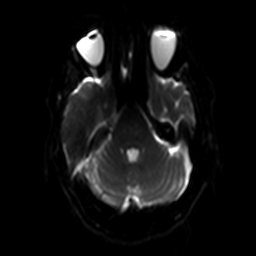
[im 33/108]
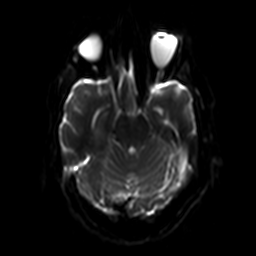
[im 38/108]
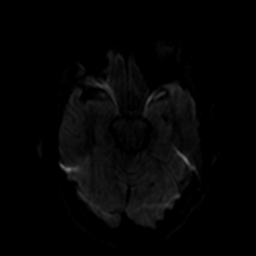
[im 42/108]
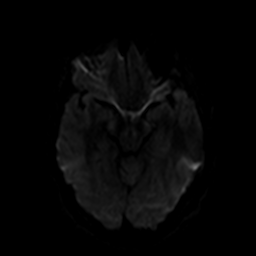
[im 46/108]
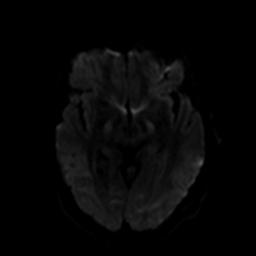
[im 50/108]
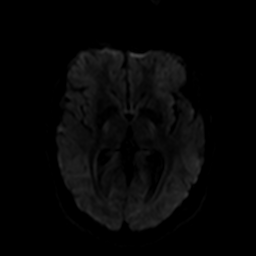
[im 54/108]
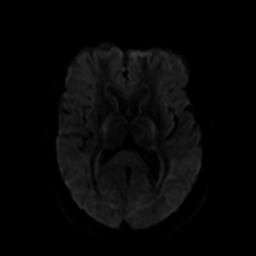
[im 58/108]
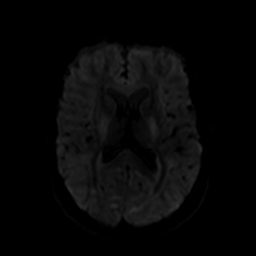
[im 62/108]
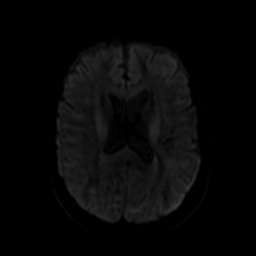
[im 66/108]
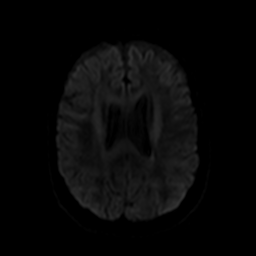
[im 70/108]
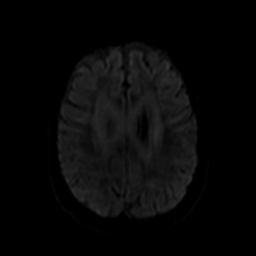
[im 75/108]
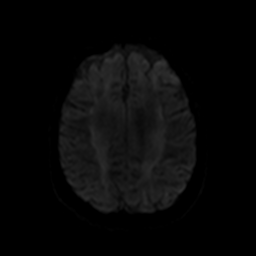
[im 79/108]
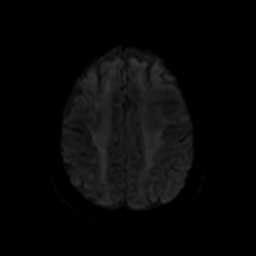
[im 83/108]
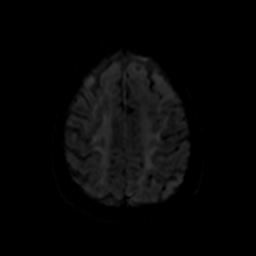
[im 87/108]
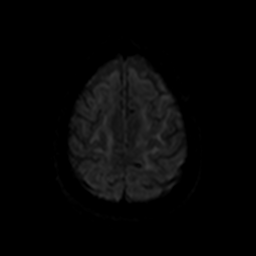
[im 91/108]
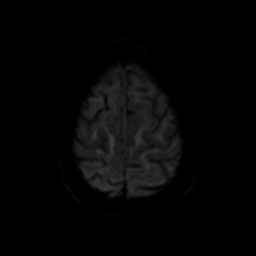
[im 95/108]
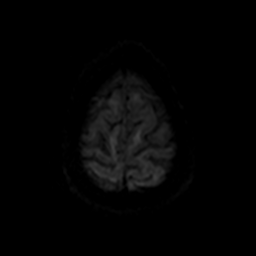
[im 99/108]
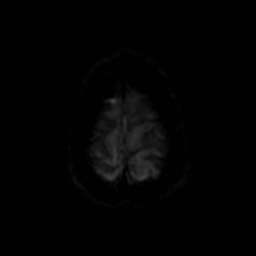
[im 103/108]
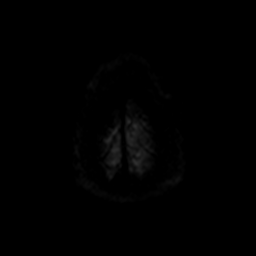
[im 108/108]
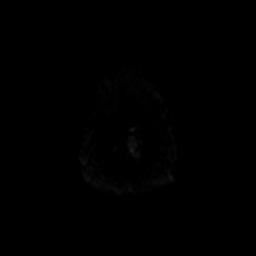

[Series 8: dwi_tracew · axial · 5.0mm · 0.94mm/px · z∈[-38,+99]mm · 14 of 54 slices shown]
[im 1/54]
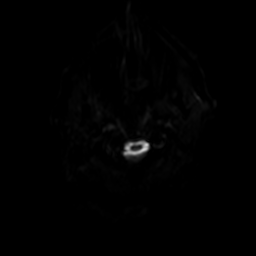
[im 5/54]
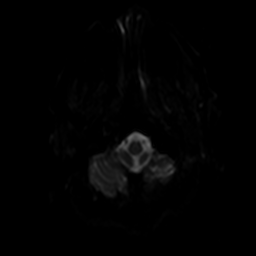
[im 9/54]
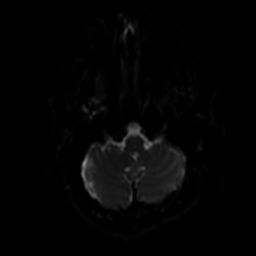
[im 13/54]
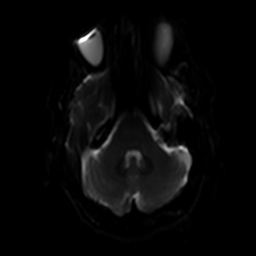
[im 17/54]
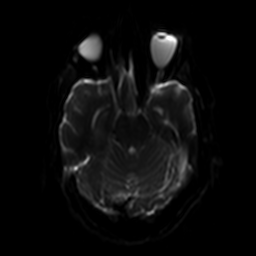
[im 21/54]
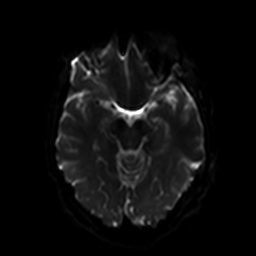
[im 25/54]
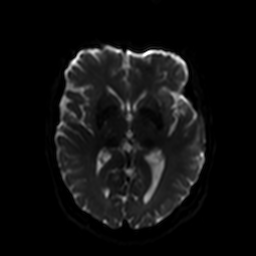
[im 29/54]
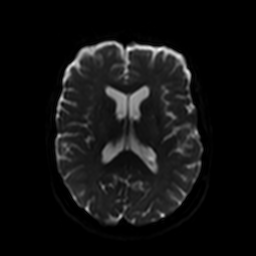
[im 33/54]
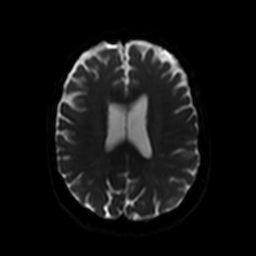
[im 37/54]
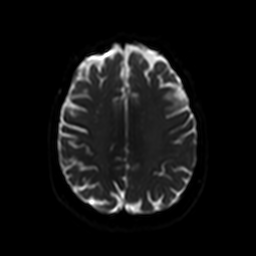
[im 41/54]
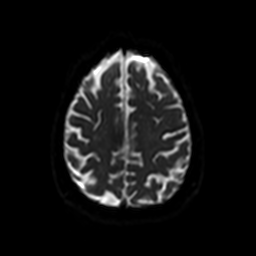
[im 45/54]
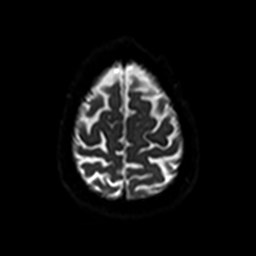
[im 49/54]
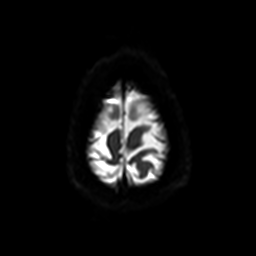
[im 54/54]
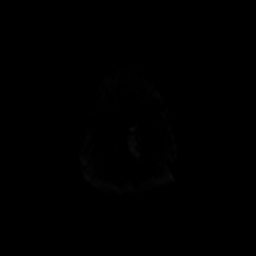

[Series 9: dwi_adc · axial · 5.0mm · 0.94mm/px · z∈[-38,+99]mm · 7 of 27 slices shown]
[im 1/27]
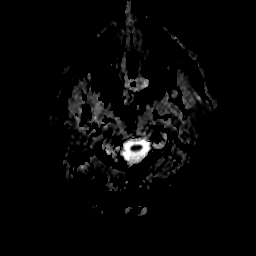
[im 5/27]
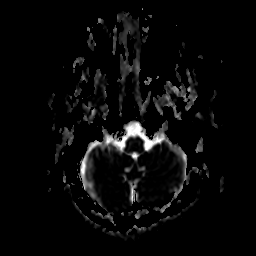
[im 9/27]
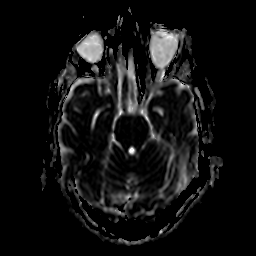
[im 14/27]
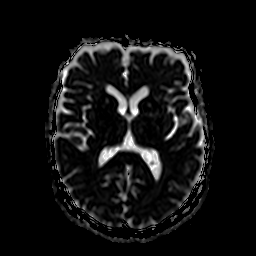
[im 18/27]
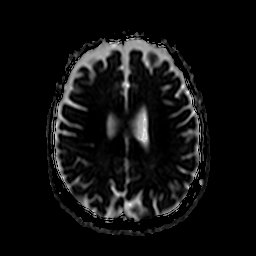
[im 22/27]
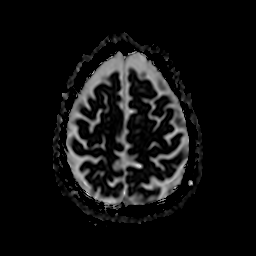
[im 27/27]
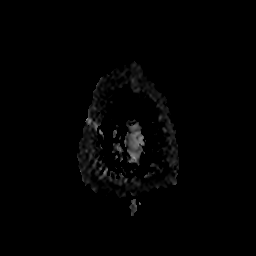

[48 of 48 positions shown; findings below may reference images not displayed]

FINDINGS: Brain: No sizable acute infarct, intracranial hemorrhage, mass,
midline shift, or extra-axial fluid collection is identified on this
limited research protocol. Mild cerebral atrophy is within normal
limits for age. No significant white matter disease is seen.

Vascular: Major intracranial vascular flow voids are preserved.

Skull and upper cervical spine: Unremarkable bone marrow signal.

Sinuses/Orbits: Unremarkable orbits. Paranasal sinuses and mastoid
air cells are clear.

Other: None.
IMPRESSION: Unremarkable appearance of the brain for age on this limited
research protocol.

## 2021-07-15 ENCOUNTER — Other Ambulatory Visit: Payer: Medicare Other

## 2021-09-05 ENCOUNTER — Other Ambulatory Visit: Payer: Self-pay | Admitting: Family Medicine

## 2021-09-05 DIAGNOSIS — M62838 Other muscle spasm: Secondary | ICD-10-CM

## 2021-09-05 DIAGNOSIS — M47812 Spondylosis without myelopathy or radiculopathy, cervical region: Secondary | ICD-10-CM

## 2021-09-29 ENCOUNTER — Other Ambulatory Visit: Payer: Self-pay

## 2021-09-29 ENCOUNTER — Ambulatory Visit
Admission: RE | Admit: 2021-09-29 | Discharge: 2021-09-29 | Disposition: A | Discharge status: Home / Self Care | Payer: No Typology Code available for payment source | Source: Ambulatory Visit | Attending: Internal Medicine | Admitting: Internal Medicine

## 2021-09-29 ENCOUNTER — Other Ambulatory Visit: Payer: Self-pay | Admitting: Internal Medicine

## 2021-09-29 DIAGNOSIS — Z006 Encounter for examination for normal comparison and control in clinical research program: Secondary | ICD-10-CM

## 2021-09-29 IMAGING — MR MR HEAD W/O CM
7 series · 48 of 48 positions shown · non-contrast
Comparison: MRI head [DATE]

CLINICAL DATA: Clinical research study.

[facility] RESEARCH STUDY ID# [[id number]] Visit 3
EXAM:
MRI HEAD WITHOUT CONTRAST
TECHNIQUE: Multiplanar, multiecho pulse sequences of the brain and surrounding
structures were obtained without intravenous contrast.

[Series 2: 3dt1 sag · sagittal · 1.2mm · 1.25mm/px · 19 of 175 slices shown]
[im 1/175]
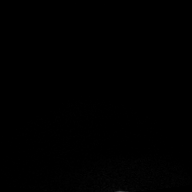
[im 10/175]
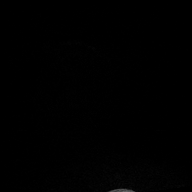
[im 20/175]
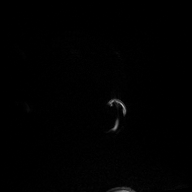
[im 30/175]
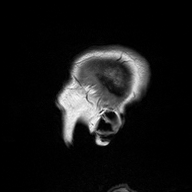
[im 39/175]
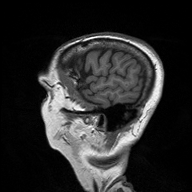
[im 49/175]
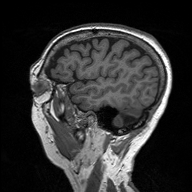
[im 59/175]
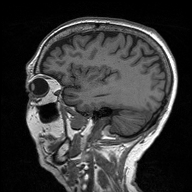
[im 68/175]
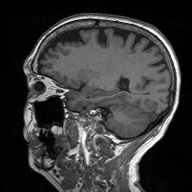
[im 78/175]
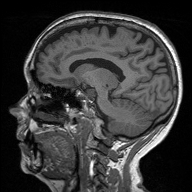
[im 88/175]
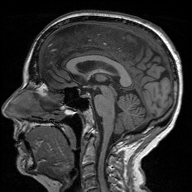
[im 97/175]
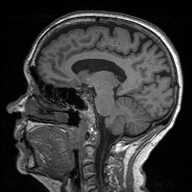
[im 107/175]
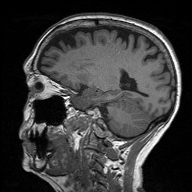
[im 117/175]
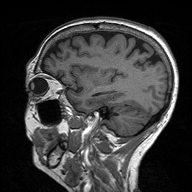
[im 126/175]
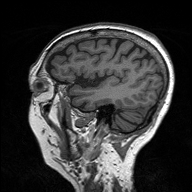
[im 136/175]
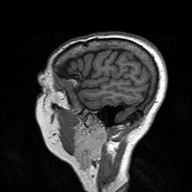
[im 146/175]
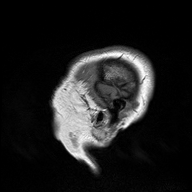
[im 155/175]
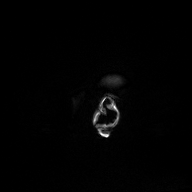
[im 165/175]
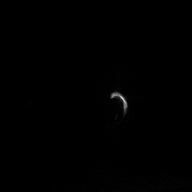
[im 175/175]
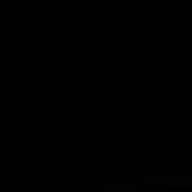

[Series 3: FLAIR · axial · 5.0mm · 0.94mm/px · z∈[-53,+99]mm · 3 of 30 slices shown]
[im 1/30]
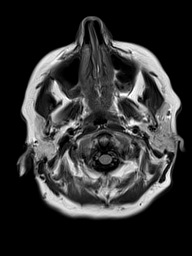
[im 15/30]
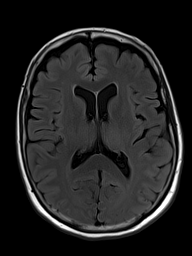
[im 30/30]
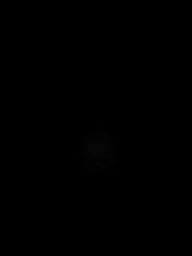

[Series 4: T2-star · axial · 5.0mm · 0.94mm/px · z∈[-53,+99]mm · 3 of 30 slices shown]
[im 1/30]
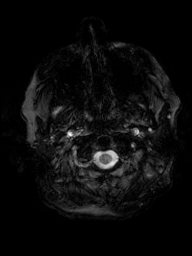
[im 15/30]
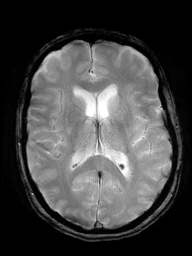
[im 30/30]
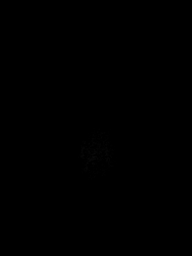

[Series 5: T2 · axial · 5.0mm · 0.94mm/px · z∈[-53,+99]mm · 3 of 30 slices shown]
[im 1/30]
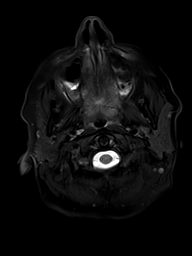
[im 15/30]
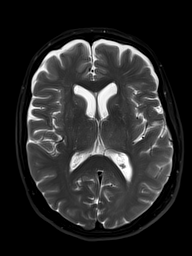
[im 30/30]
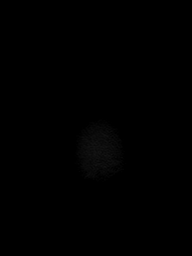

[Series 6: DWI · axial · 5.0mm · 0.94mm/px · z∈[-46,+90]mm · 11 of 108 slices shown]
[im 1/108]
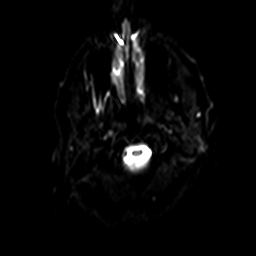
[im 11/108]
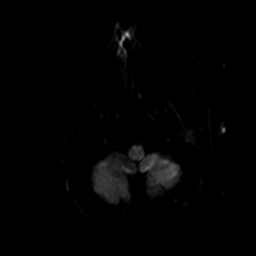
[im 22/108]
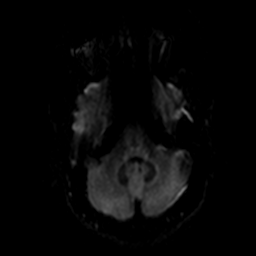
[im 33/108]
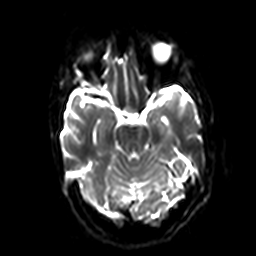
[im 43/108]
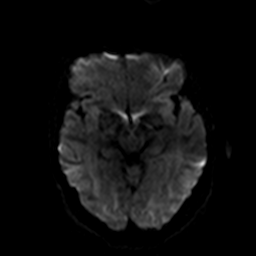
[im 54/108]
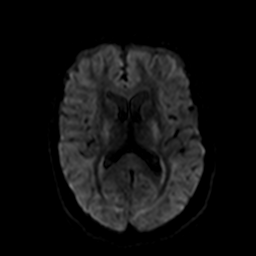
[im 65/108]
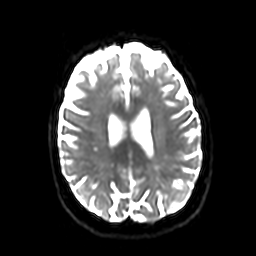
[im 75/108]
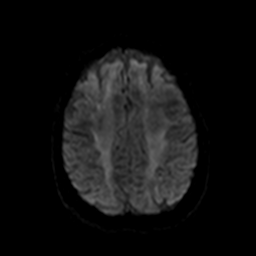
[im 86/108]
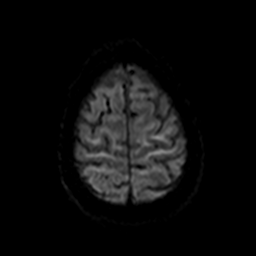
[im 97/108]
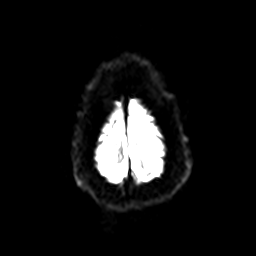
[im 108/108]
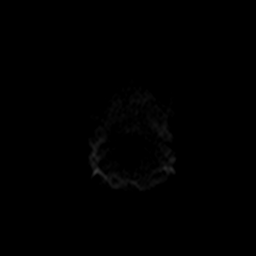

[Series 7: ax dwi_tracew · axial · 5.0mm · 0.94mm/px · z∈[-46,+90]mm · 6 of 54 slices shown]
[im 1/54]
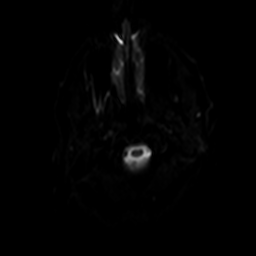
[im 11/54]
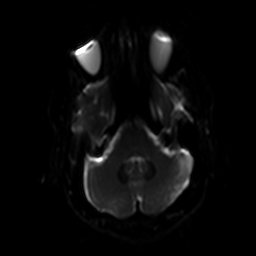
[im 22/54]
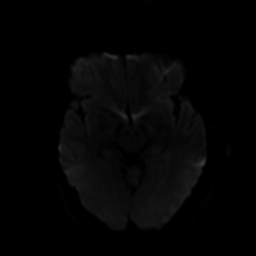
[im 32/54]
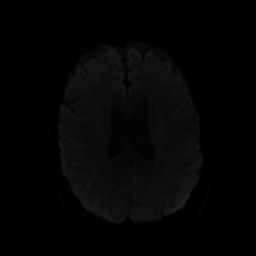
[im 43/54]
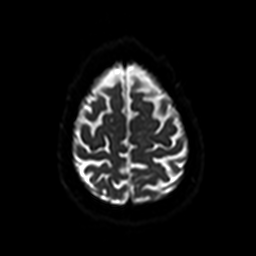
[im 54/54]
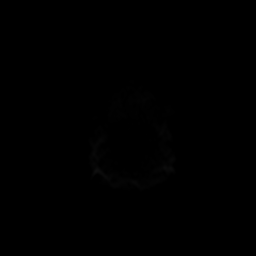

[Series 8: ax dwi_adc · axial · 5.0mm · 0.94mm/px · z∈[-46,+90]mm · 3 of 27 slices shown]
[im 1/27]
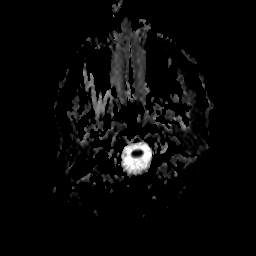
[im 14/27]
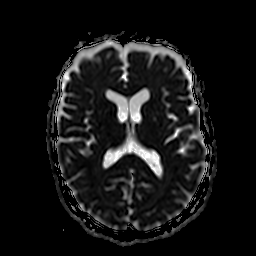
[im 27/27]
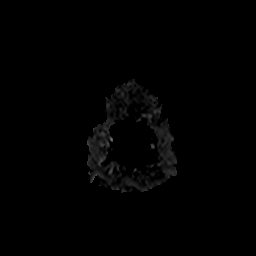

[48 of 48 positions shown; findings below may reference images not displayed]

FINDINGS: Brain: Limited imaging protocol. Ventricle size and cerebral volume
normal. Negative for acute infarct, hemorrhage, mass. Normal white
matter. No significant chronic ischemia. Pituitary not enlarged.

Vascular: Normal arterial flow voids at the skull base.

Skull and upper cervical spine: Negative

Sinuses/Orbits: Paranasal sinuses clear.  Negative orbit

Other: None
IMPRESSION: Negative MRI head without contrast. Limited MRI protocol for
research purposes.

## 2021-09-30 ENCOUNTER — Encounter: Payer: Self-pay | Admitting: Family Medicine

## 2021-10-03 ENCOUNTER — Other Ambulatory Visit: Payer: Self-pay | Admitting: Family Medicine

## 2021-10-28 ENCOUNTER — Other Ambulatory Visit: Payer: Self-pay | Admitting: Family Medicine

## 2021-10-28 DIAGNOSIS — Z006 Encounter for examination for normal comparison and control in clinical research program: Secondary | ICD-10-CM

## 2021-11-16 ENCOUNTER — Ambulatory Visit
Admission: RE | Admit: 2021-11-16 | Discharge: 2021-11-16 | Disposition: A | Discharge status: Home / Self Care | Payer: No Typology Code available for payment source | Source: Ambulatory Visit | Attending: Family Medicine | Admitting: Family Medicine

## 2021-11-16 DIAGNOSIS — Z006 Encounter for examination for normal comparison and control in clinical research program: Secondary | ICD-10-CM

## 2021-11-16 IMAGING — MR MR HEAD W/O CM
8 series · 48 of 48 positions shown · non-contrast
Comparison: MRI [DATE].

CLINICAL DATA: Clinical research study.

EXAM:
MRI HEAD WITHOUT CONTRAST
TECHNIQUE: Multiplanar, multiecho pulse sequences of the brain and surrounding
structures were obtained without intravenous contrast.

[Series 2: 3dt1 sag · sagittal · 1.2mm · 1.25mm/px · 13 of 176 slices shown (1 of 2)]
[im 1/176]
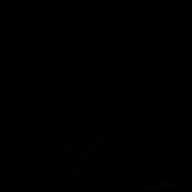
[im 15/176]
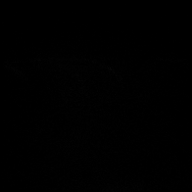
[im 30/176]
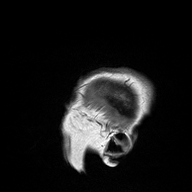
[im 44/176]
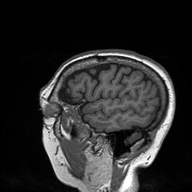
[im 59/176]
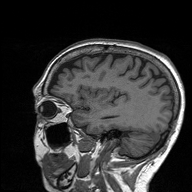
[im 73/176]
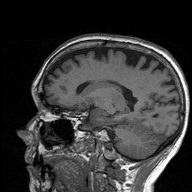
[im 88/176]
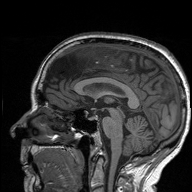
[im 103/176]
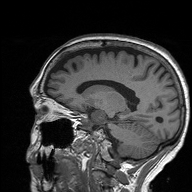
[im 117/176]
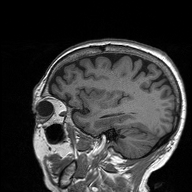
[im 132/176]
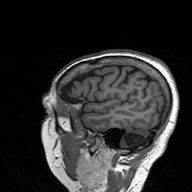
[im 146/176]
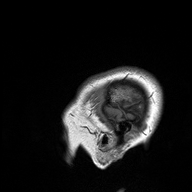
[im 161/176]
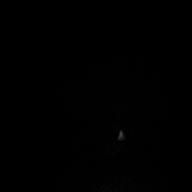
[im 176/176]
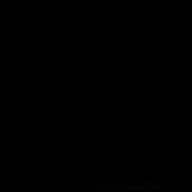

[Series 3: FLAIR · axial · 5.0mm · 0.94mm/px · z∈[-62,+111]mm · 2 of 34 slices shown]
[im 1/34]
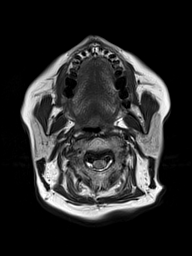
[im 34/34]
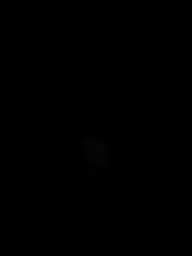

[Series 4: T2-star · axial · 5.0mm · 0.94mm/px · z∈[-44,+113]mm · 2 of 31 slices shown]
[im 1/31]
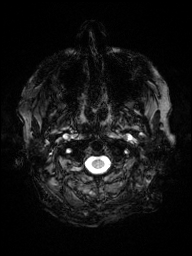
[im 31/31]
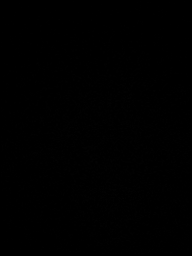

[Series 5: 3dt1 sag · sagittal · 1.2mm · 1.25mm/px · 13 of 175 slices shown (2 of 2)]
[im 1/175]
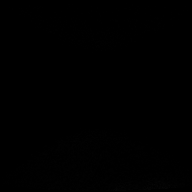
[im 15/175]
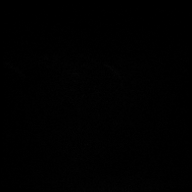
[im 30/175]
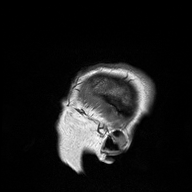
[im 44/175]
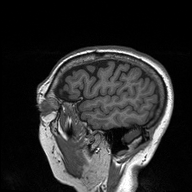
[im 59/175]
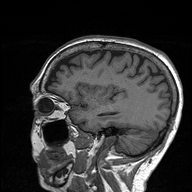
[im 73/175]
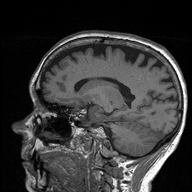
[im 88/175]
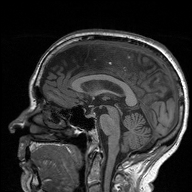
[im 102/175]
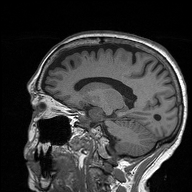
[im 117/175]
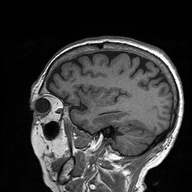
[im 131/175]
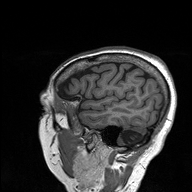
[im 146/175]
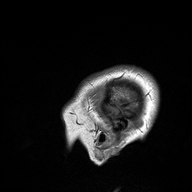
[im 160/175]
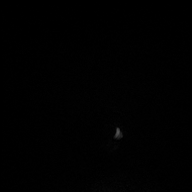
[im 175/175]
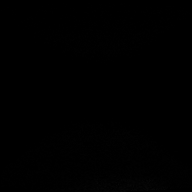

[Series 6: T2 · axial · 5.0mm · 0.94mm/px · z∈[-51,+112]mm · 2 of 32 slices shown]
[im 1/32]
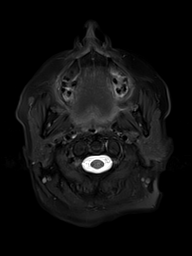
[im 32/32]
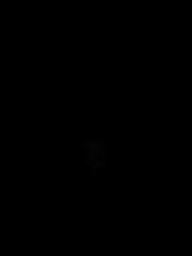

[Series 7: DWI · axial · 5.0mm · 0.94mm/px · z∈[-52,+111]mm · 9 of 127 slices shown]
[im 1/127]
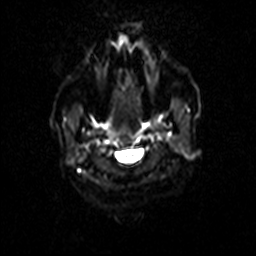
[im 16/127]
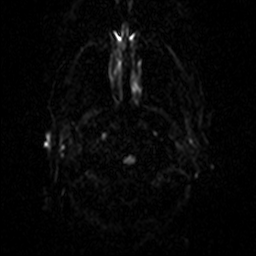
[im 32/127]
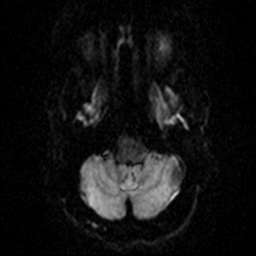
[im 48/127]
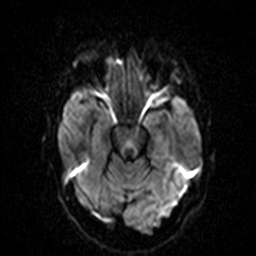
[im 64/127]
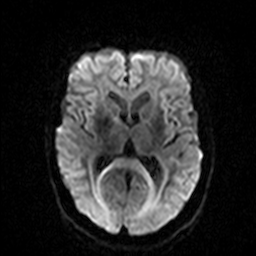
[im 79/127]
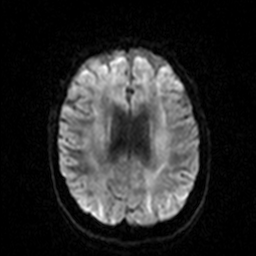
[im 95/127]
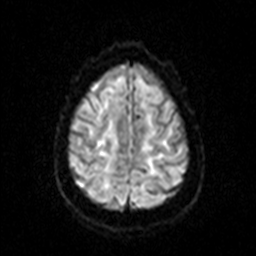
[im 111/127]
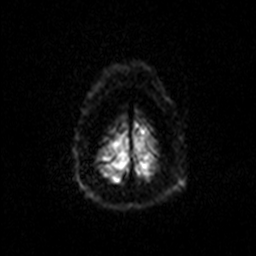
[im 127/127]
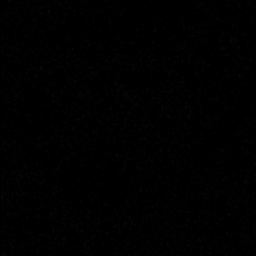

[Series 8: ax dwi_tracew · axial · 5.0mm · 0.94mm/px · z∈[-52,+111]mm · 5 of 63 slices shown]
[im 1/63]
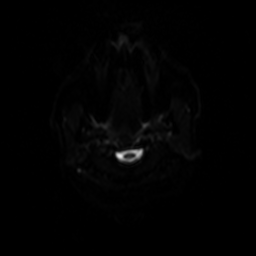
[im 16/63]
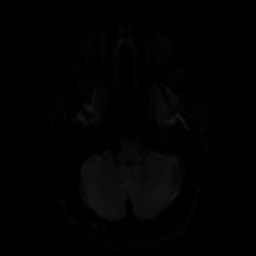
[im 32/63]
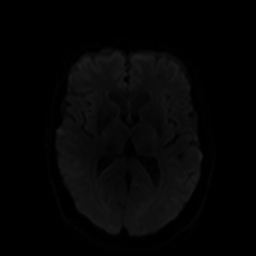
[im 47/63]
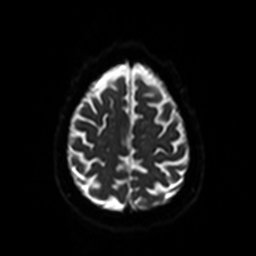
[im 63/63]
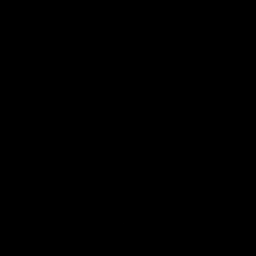

[Series 9: ax dwi_adc · axial · 5.0mm · 0.94mm/px · z∈[-52,+106]mm · 2 of 31 slices shown]
[im 1/31]
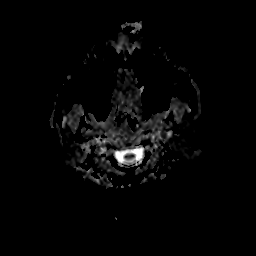
[im 31/31]
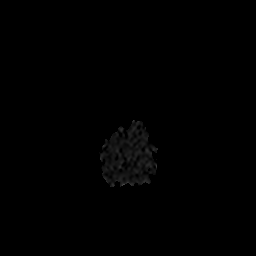

[48 of 48 positions shown; findings below may reference images not displayed]

FINDINGS: Limited MRI protocol for research purposes.

Brain: Small focus of DWI hyperintensity within the cerebellar
vermis is favored artifactual given similar appearance on the prior
and no correlate ADC abnormality or edema. No convincing evidence of
acute infarct. No evidence of acute hemorrhage, mass lesion, midline
shift, hydrocephalus, or extra-axial fluid collection.

Vascular: Major arterial flow voids are maintained at the skull
base.

Skull and upper cervical spine: Normal marrow signal.

Sinuses/Orbits: Clear sinuses.  No acute orbital findings.

Other: No mastoid effusions.
IMPRESSION: No evidence of acute intracranial abnormality on this limited
research protocol.

## 2021-12-05 NOTE — Progress Notes (Signed)
Name: Hannah Lindsey   MRN: [id number]    DOB: July 25, 1946   Date:12/06/2021 ? ?     Progress Note ? ?Subjective ? ?Chief Complaint ? ?Follow Up ? ?HPI ? ?HTN:she is on Norvasc 10 mg and higher dose of Diovan 160 /12.5, but has been towards low end of normal, we will try to decrease dose of diovan again to 80/12.5 She denies dizziness, chest pain or palpitation.  ?  ?Headache: still present, top of the head, intermittent, nortriptyline did not really help and caused constipation, so she stopped medication.  Mild and throbbing like pain radiates from left  parietal area to her nose but now is more like a dull ache and no longer constant  She states mother and two siblings with Parkinson's disease. Marland Kitchen  She was seen by neurologist , Dr. Melrose Nakayama, she was given Effexor two 75 mg daily , she states symptoms improved temporarily but had to keep increasing dose, she had a problem filling rx and had severe withdrawal so she decided to stop taking. She had multiple MRI's and normal  ? ?Alzheimer's study: going to have MRI brain done soon, also genetic testing  ? ?Hypercalcemia: she stopped taking otc calcium and labs normalized , but last level it was up again, we will recheck parathyroid test again  ? 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 ?Lesion right ear lobe: going on for months, she has an appointment to see Dermatologist  ?  ?Hyperlipidemia: she is taking Crestor and last LDL at goal, we will recheck labs  ?  ?Osteopenia: low FRAX score, not on calcium because of hypercalcemia but is taking vitamin D and she walks on a regular basis and goes to the gym twice a week  ? ?Perennial allergic rhinitis: she uses flonase prn for nasal congestion,and loratadine  ?  ?Senile Purpura: doing better now, but still bruises on arms, unchanged  ? ?SK: capitis, using otc selsum blue and stable  ? ?Patient Active Problem List  ? Diagnosis Date Noted   ? Senile purpura ([place]) 12/08/2020  ? Tinnitus of both ears 12/07/2020  ? Anxiety, generalized 10/20/2020  ? Chronic daily headache 08/22/2020  ? Perennial allergic rhinitis with seasonal variation 11/09/2017  ? Osteopenia after menopause 11/09/2017  ? Post-menopausal 09/06/2016  ? Hyperlipidemia 06/19/2016  ? Essential hypertension 06/04/2015  ? ? ?Past Surgical History:  ?Procedure Laterality Date  ? COLONOSCOPY WITH PROPOFOL N/A 08/19/2018  ? Procedure: COLONOSCOPY WITH PROPOFOL;  Surgeon: Manya Silvas, MD;  Location[facility] ENDOSCOPY;  Service: Endoscopy;  Laterality: N/A;  ? TONSILLECTOMY    ? ? ?Family History  ?Problem Relation Age of Onset  ? Fibromyalgia Mother   ? Heart Problems Mother   ? Dementia Father   ? Heart Problems Father   ? Heart disease Father   ? Breast cancer Maternal Grandmother   ? Cancer Maternal Grandmother   ? Cancer Maternal Aunt   ? Breast cancer Maternal Aunt   ? ? ?Social History  ? ?Tobacco Use  ? Smoking status: Never  ? Smokeless tobacco: Never  ?Substance Use Topics  ? Alcohol use: Yes  ?  Alcohol/week: 1.0 standard drink  ?  Types: 1 Glasses of wine per week  ?  Comment: nightly  ? ? ? ?Current Outpatient Medications:  ?  amLODipine (NORVASC) 10 MG tablet, Take 1 tablet (10 mg  total) by mouth daily., Disp: 90 tablet, Rfl: 1 ?  baclofen (LIORESAL) 10 MG tablet, TAKE 1 TABLET BY MOUTH DAILY AS NEEDED FOR MUSCLE SPASMS., Disp: 90 tablet, Rfl: 0 ?  diclofenac Sodium (VOLTAREN) 1 % GEL, Apply 2 g topically 4 (four) times daily., Disp: 100 g, Rfl: 1 ?  fluticasone (FLONASE) 50 MCG/ACT nasal spray, SPRAY 2 SPRAYS INTO EACH NOSTRIL EVERY DAY, Disp: 16 mL, Rfl: 1 ?  loratadine (CLARITIN) 10 MG tablet, Take 10 mg by mouth daily., Disp: , Rfl:  ?  rosuvastatin (CRESTOR) 20 MG tablet, Take 1 tablet (20 mg total) by mouth daily., Disp: 90 tablet, Rfl: 1 ? ?No Known Allergies ? ?I personally reviewed active problem list, medication list, allergies, family history, social history, health  maintenance with the patient/caregiver today. ? ? ?ROS ? ?Constitutional: Negative for fever or weight change.  ?Respiratory: Negative for cough and shortness of breath.   ?Cardiovascular: Negative for chest pain or palpitations.  ?Gastrointestinal: Negative for abdominal pain, no bowel changes.  ?Musculoskeletal: Negative for gait problem or joint swelling.  ?Skin: Negative for rash.  ?Neurological: Negative for dizziness , positive for headache.  ?No other specific complaints in a complete review of systems (except as listed in HPI above).  ? ?Objective ? ?Vitals:  ? 12/06/21 0847  ?BP: 118/70  ?Pulse: 79  ?Resp: 14  ?Temp: 97.7 ?F (36.5 ?C)  ?TempSrc: Oral  ?SpO2: 96%  ?Weight: 163 lb 8 oz (74.2 kg)  ?Height: '5\' 5"'$  (1.651 m)  ? ? ?Body mass index is 27.21 kg/m?. ? ?Physical Exam ? ?Constitutional: Patient appears well-developed and well-nourished.  No distress.  ?HEENT: head atraumatic, normocephalic, pupils equal and reactive to light,, neck supple ?Cardiovascular: Normal rate, regular rhythm and normal heart sounds.  No murmur heard. No BLE edema. ?Pulmonary/Chest: Effort normal and breath sounds normal. No respiratory distress. ?Abdominal: Soft.  There is no tenderness. ?Psychiatric: Patient has a normal mood and affect. behavior is normal. Judgment and thought content normal.  ? ? ?PHQ2/9: ? ?  12/06/2021  ?  8:48 AM 06/10/2021  ?  8:32 AM 12/23/2020  ? 11:28 AM 12/08/2020  ?  8:05 AM 06/08/2020  ?  2:04 PM  ?Depression screen PHQ 2/9  ?Decreased Interest 0 0 0 0 0  ?Down, Depressed, Hopeless 0 0 0 0 0  ?PHQ - 2 Score 0 0 0 0 0  ?Altered sleeping  0     ?Tired, decreased energy  0     ?Change in appetite  0     ?Feeling bad or failure about yourself   0     ?Trouble concentrating  0     ?Moving slowly or fidgety/restless  0     ?Suicidal thoughts  0     ?PHQ-9 Score  0     ?  ?phq 9 is negative ? ? ?Fall Risk: ? ?  12/06/2021  ?  8:48 AM 06/10/2021  ?  8:32 AM 12/23/2020  ? 11:31 AM 12/08/2020  ?  8:05 AM 06/08/2020  ?   2:04 PM  ?Fall Risk   ?Falls in the past year? 0 '1 1 1 '$ 0  ?Number falls in past yr:  0 0 0 0  ?Injury with Fall?  '1 1 1 '$ 0  ?Risk for fall due to : No Fall Risks No Fall Risks History of fall(s)    ?Follow up Falls prevention discussed Falls prevention discussed Falls prevention discussed    ? ? ? ? ?  Functional Status Survey: ?Is the patient deaf or have difficulty hearing?: No ?Does the patient have difficulty seeing, even when wearing glasses/contacts?: No ?Does the patient have difficulty concentrating, remembering, or making decisions?: No ?Does the patient have difficulty walking or climbing stairs?: No ?Does the patient have difficulty dressing or bathing?: No ?Does the patient have difficulty doing errands alone such as visiting a doctor's office or shopping?: No ? ? ? ?Assessment & Plan ? ?1. Senile purpura ([place]) ? ?Doing better  ? ?2. Vitamin D deficiency ? ?- VITAMIN D 25 Hydroxy (Vit-D Deficiency, Fractures) ? ?3. Pure hypercholesterolemia ? ?- Lipid panel ?- rosuvastatin (CRESTOR) 20 MG tablet; Take 1 tablet (20 mg total) by mouth daily.  Dispense: 90 tablet; Refill: 1 ? ?4. Essential hypertension ? ?- CBC with Differential/Platelet ?- COMPLETE METABOLIC PANEL WITH GFR ?- valsartan-hydrochlorothiazide (DIOVAN-HCT) 80-12.5 MG tablet; Take 1 tablet by mouth daily.  Dispense: 90 tablet; Refill: 1 ?- amLODipine (NORVASC) 10 MG tablet; Take 1 tablet (10 mg total) by mouth daily.  Dispense: 90 tablet; Refill: 1 ? ?5. Hyperglycemia ? ?Last A1C normal  ? ?6. Perennial allergic rhinitis with seasonal variation ? ? ?7. Need for shingles vaccine ? ? ?8. Seborrhea capitis ? ? ?9. Osteopenia after menopause ? ?- VITAMIN D 25 Hydroxy (Vit-D Deficiency, Fractures) ? 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 ? ?11. Daily headache ? ? ?12. Hypercalcemia ? ?- PTH, intact and calcium ? ?13. Neck muscle spasm ?  ?

## 2021-12-06 ENCOUNTER — Ambulatory Visit (INDEPENDENT_AMBULATORY_CARE_PROVIDER_SITE_OTHER): Payer: Medicare Other | Admitting: Family Medicine

## 2021-12-06 ENCOUNTER — Encounter: Payer: Self-pay | Admitting: Family Medicine

## 2021-12-06 VITALS — BP 118/70 | HR 79 | Temp 97.7°F | Resp 14 | Ht 65.0 in | Wt 163.5 lb

## 2021-12-06 DIAGNOSIS — Z780 Asymptomatic menopausal state: Secondary | ICD-10-CM

## 2021-12-06 DIAGNOSIS — E7800 Pure hypercholesterolemia, unspecified: Secondary | ICD-10-CM

## 2021-12-06 DIAGNOSIS — R519 Headache, unspecified: Secondary | ICD-10-CM

## 2021-12-06 DIAGNOSIS — M8580 Other specified disorders of bone density and structure, unspecified site: Secondary | ICD-10-CM

## 2021-12-06 DIAGNOSIS — D692 Other nonthrombocytopenic purpura: Secondary | ICD-10-CM

## 2021-12-06 DIAGNOSIS — J302 Other seasonal allergic rhinitis: Secondary | ICD-10-CM

## 2021-12-06 DIAGNOSIS — I10 Essential (primary) hypertension: Secondary | ICD-10-CM

## 2021-12-06 DIAGNOSIS — L210 Seborrhea capitis: Secondary | ICD-10-CM

## 2021-12-06 DIAGNOSIS — J3089 Other allergic rhinitis: Secondary | ICD-10-CM

## 2021-12-06 DIAGNOSIS — R739 Hyperglycemia, unspecified: Secondary | ICD-10-CM

## 2021-12-06 DIAGNOSIS — M47812 Spondylosis without myelopathy or radiculopathy, cervical region: Secondary | ICD-10-CM

## 2021-12-06 DIAGNOSIS — Z23 Encounter for immunization: Secondary | ICD-10-CM

## 2021-12-06 DIAGNOSIS — E559 Vitamin D deficiency, unspecified: Secondary | ICD-10-CM

## 2021-12-06 DIAGNOSIS — M62838 Other muscle spasm: Secondary | ICD-10-CM

## 2021-12-06 MED ORDER — VALSARTAN-HYDROCHLOROTHIAZIDE 80-12.5 MG PO TABS: 1.0000 | ORAL_TABLET | Freq: Every day | ORAL | 1 refills | Status: DC

## 2021-12-06 MED ORDER — ROSUVASTATIN CALCIUM 20 MG PO TABS: 20.0000 mg | ORAL_TABLET | Freq: Every day | ORAL | 1 refills | Status: DC

## 2021-12-06 MED ORDER — AMLODIPINE BESYLATE 10 MG PO TABS: 10.0000 mg | ORAL_TABLET | Freq: Every day | ORAL | 1 refills | Status: DC

## 2021-12-07 LAB — CBC WITH DIFFERENTIAL/PLATELET
Absolute Monocytes: 409 cells/uL (ref 200–950)
Basophils Absolute: 20 cells/uL (ref 0–200)
Basophils Relative: 0.3 %
Eosinophils Absolute: 53 cells/uL (ref 15–500)
Eosinophils Relative: 0.8 %
HCT: 44.5 % (ref 35.0–45.0)
Hemoglobin: 14.8 g/dL (ref 11.7–15.5)
Lymphs Abs: 1670 cells/uL (ref 850–3900)
MCH: 28.4 pg (ref 27.0–33.0)
MCHC: 33.3 g/dL (ref 32.0–36.0)
MCV: 85.2 fL (ref 80.0–100.0)
MPV: 9.4 fL (ref 7.5–12.5)
Monocytes Relative: 6.2 %
Neutro Abs: 4448 cells/uL (ref 1500–7800)
Neutrophils Relative %: 67.4 %
Platelets: 178 10*3/uL (ref 140–400)
RBC: 5.22 10*6/uL — ABNORMAL HIGH (ref 3.80–5.10)
RDW: 12.5 % (ref 11.0–15.0)
Total Lymphocyte: 25.3 %
WBC: 6.6 10*3/uL (ref 3.8–10.8)

## 2021-12-07 LAB — COMPLETE METABOLIC PANEL WITH GFR
AG Ratio: 1.9 (calc) (ref 1.0–2.5)
ALT: 23 U/L (ref 6–29)
AST: 24 U/L (ref 10–35)
Albumin: 4.9 g/dL (ref 3.6–5.1)
Alkaline phosphatase (APISO): 82 U/L (ref 37–153)
BUN: 18 mg/dL (ref 7–25)
CO2: 33 mmol/L — ABNORMAL HIGH (ref 20–32)
Calcium: 10.2 mg/dL (ref 8.6–10.4)
Chloride: 102 mmol/L (ref 98–110)
Creat: 0.72 mg/dL (ref 0.60–1.00)
Globulin: 2.6 g/dL (calc) (ref 1.9–3.7)
Glucose, Bld: 95 mg/dL (ref 65–99)
Potassium: 4.3 mmol/L (ref 3.5–5.3)
Sodium: 142 mmol/L (ref 135–146)
Total Bilirubin: 0.5 mg/dL (ref 0.2–1.2)
Total Protein: 7.5 g/dL (ref 6.1–8.1)
eGFR: 87 mL/min/{1.73_m2} (ref >=60)

## 2021-12-07 LAB — LIPID PANEL
Cholesterol: 142 mg/dL (ref <200)
HDL: 51 mg/dL (ref >=50)
LDL Cholesterol (Calc): 71 mg/dL (calc)
Non-HDL Cholesterol (Calc): 91 mg/dL (calc) (ref <130)
Total CHOL/HDL Ratio: 2.8 (calc) (ref <5.0)
Triglycerides: 113 mg/dL (ref <150)

## 2021-12-07 LAB — PTH, INTACT AND CALCIUM
Calcium: 10.2 mg/dL (ref 8.6–10.4)
PTH: 63 pg/mL (ref 16–77)

## 2021-12-07 LAB — VITAMIN D 25 HYDROXY (VIT D DEFICIENCY, FRACTURES): Vit D, 25-Hydroxy: 40 ng/mL (ref 30–100)

## 2021-12-21 ENCOUNTER — Other Ambulatory Visit: Payer: Self-pay | Admitting: Family Medicine

## 2021-12-21 ENCOUNTER — Ambulatory Visit: Payer: Medicare Other | Admitting: Dermatology

## 2021-12-21 DIAGNOSIS — Z006 Encounter for examination for normal comparison and control in clinical research program: Secondary | ICD-10-CM

## 2021-12-26 ENCOUNTER — Ambulatory Visit: Payer: No Typology Code available for payment source | Admitting: Dermatology

## 2021-12-26 DIAGNOSIS — L570 Actinic keratosis: Secondary | ICD-10-CM | POA: Diagnosis not present

## 2021-12-26 DIAGNOSIS — L578 Other skin changes due to chronic exposure to nonionizing radiation: Secondary | ICD-10-CM

## 2021-12-26 DIAGNOSIS — L219 Seborrheic dermatitis, unspecified: Secondary | ICD-10-CM

## 2021-12-26 DIAGNOSIS — L299 Pruritus, unspecified: Secondary | ICD-10-CM | POA: Diagnosis not present

## 2021-12-26 MED ORDER — MOMETASONE FUROATE 0.1 % EX SOLN: CUTANEOUS | 11 refills | Status: DC

## 2021-12-26 MED ORDER — KETOCONAZOLE 2 % EX SHAM: MEDICATED_SHAMPOO | CUTANEOUS | 11 refills | Status: DC

## 2021-12-26 NOTE — Patient Instructions (Addendum)
Actinic keratoses are precancerous spots that appear secondary to cumulative UV radiation exposure/sun exposure over time. They are chronic with expected duration over 1 year. A portion of actinic keratoses will progress to squamous cell carcinoma of the skin. It is not possible to reliably predict which spots will progress to skin cancer and so treatment is recommended to prevent development of skin cancer.  Recommend daily broad spectrum sunscreen SPF 30+ to sun-exposed areas, reapply every 2 hours as needed.  Recommend staying in the shade or wearing long sleeves, sun glasses (UVA+UVB protection) and wide brim hats (4-inch brim around the entire circumference of the hat). Call for new or changing lesions.   Cryotherapy Aftercare  Wash gently with soap and water everyday.   Apply Vaseline and Band-Aid daily until healed.   If You Need Anything After Your Visit  If you have any questions or concerns for your doctor, please call our main line at [phone] and press option 4 to reach your doctor's medical assistant. If no one answers, please leave a voicemail as directed and we will return your call as soon as possible. Messages left after 4 pm will be answered the following business day.   You may also send [place] a message via [place]. We typically respond to MyChart messages within 1-2 business days.  For prescription refills, please ask your pharmacy to contact our office. Our fax number is [phone].  If you have an urgent issue when the clinic is closed that cannot wait until the next business day, you can page your doctor at the number below.    Please note that while we do our best to be available for urgent issues outside of office hours, we are not available 24/7.   If you have an urgent issue and are unable to reach [place], you may choose to seek medical care at your doctor's office, retail clinic, urgent care center, or emergency room.  If you have a medical emergency, please  immediately call 911 or go to the emergency department.  Pager Numbers  - Dr. Nehemiah Massed: [phone]  - Dr. Laurence Ferrari: [phone]  - Dr. Nicole Kindred: [phone]  In the event of inclement weather, please call our main line at [phone] for an update on the status of any delays or closures.  Dermatology Medication Tips: Please keep the boxes that topical medications come in in order to help keep track of the instructions about where and how to use these. Pharmacies typically print the medication instructions only on the boxes and not directly on the medication tubes.   If your medication is too expensive, please contact our office at [phone] option 4 or send [place] a message through [place].   We are unable to tell what your co-pay for medications will be in advance as this is different depending on your insurance coverage. However, we may be able to find a substitute medication at lower cost or fill out paperwork to get insurance to cover a needed medication.   If a prior authorization is required to get your medication covered by your insurance company, please allow [place] 1-2 business days to complete this process.  Drug prices often vary depending on where the prescription is filled and some pharmacies may offer cheaper prices.  The website www.goodrx.com contains coupons for medications through different pharmacies. The prices here do not account for what the cost may be with help from insurance (it may be cheaper with your insurance), but the website can give you the price if you  did not use any insurance.  - You can print the associated coupon and take it with your prescription to the pharmacy.  - You may also stop by our office during regular business hours and pick up a GoodRx coupon card.  - If you need your prescription sent electronically to a different pharmacy, notify our office through [facility] or by phone at [phone] option 4.     Si Usted Necesita Algo Despus  de Su Visita  Tambin puede enviarnos un mensaje a travs de Pharmacist, community. Por lo general respondemos a los mensajes de MyChart en el transcurso de 1 a 2 das hbiles.  Para renovar recetas, por favor pida a su farmacia que se ponga en contacto con nuestra oficina. Harland Dingwall de fax es [place] [phone].  Si tiene un asunto urgente cuando la clnica est cerrada y que no puede esperar hasta el siguiente da hbil, puede llamar/localizar a su doctor(a) al nmero que aparece a continuacin.   Por favor, tenga en cuenta que aunque hacemos todo lo posible para estar disponibles para asuntos urgentes fuera del horario de [place], no estamos disponibles las 24 horas del da, los 7 das de la [place].   Si tiene un problema urgente y no puede comunicarse con nosotros, puede optar por buscar atencin mdica  en el consultorio de su doctor(a), en una clnica privada, en un centro de atencin urgente o en una sala de emergencias.  Si tiene Engineering geologist, por favor llame inmediatamente al 911 o vaya a la sala de emergencias.  Nmeros de bper  - Dr. Nehemiah Massed: [phone]  - Dra. Moye: [phone]  - Dra. Nicole Kindred: [phone]  En caso de inclemencias del [place], por favor llame a Johnsie Kindred principal al [phone] para una actualizacin sobre el [place] de cualquier retraso o cierre.  Consejos para la medicacin en dermatologa: Por favor, guarde las cajas en las que vienen los medicamentos de uso tpico para ayudarle a seguir las instrucciones sobre dnde y cmo usarlos. Las farmacias generalmente imprimen las instrucciones del medicamento slo en las cajas y no directamente en los tubos del [place].   Si su medicamento es muy caro, por favor, pngase en contacto con Zigmund Daniel llamando al [phone] y presione la opcin 4 o envenos un mensaje a travs de Pharmacist, community.   No podemos decirle cul ser su copago por los medicamentos por adelantado ya que esto es diferente dependiendo  de la cobertura de su seguro. Sin embargo, es posible que podamos encontrar un medicamento sustituto a Electrical engineer un formulario para que el seguro cubra el medicamento que se considera necesario.   Si se requiere una autorizacin previa para que su compaa de seguros [place] su medicamento, por favor permtanos de 1 a 2 das hbiles para completar este proceso.  Los precios de los medicamentos varan con frecuencia dependiendo del Environmental consultant de dnde se surte la receta y alguna farmacias pueden ofrecer precios ms baratos.  El sitio web www.goodrx.com tiene cupones para medicamentos de Airline pilot. Los precios aqu no tienen en cuenta lo que podra costar con la ayuda del seguro (puede ser ms barato con su seguro), pero el sitio web puede darle el precio si no utiliz Research scientist (physical sciences).  - Puede imprimir el cupn correspondiente y llevarlo con su receta a la farmacia.  - Tambin puede pasar por nuestra oficina durante el horario de atencin regular y Charity fundraiser una tarjeta de cupones de GoodRx.  - Si necesita que su receta se  enve electrnicamente a una farmacia diferente, informe a nuestra oficina a travs de MyChart de [place] o por telfono llamando al [phone] y presione la opcin 4.  

## 2021-12-26 NOTE — Progress Notes (Signed)
New Patient Visit  Subjective  Hannah Lindsey is a 76 y.o. female who presents for the following: New Patient (Initial Visit) (Patient here today concerning itchy scalp and behind ears. She also reports some scaly areas at ears she would like checked. ). The patient has spots, moles and lesions to be evaluated, some may be new or changing and the patient has concerns that these could be cancer.  The following portions of the chart were reviewed this encounter and updated as appropriate:   Tobacco  Allergies  Meds  Problems  Med Hx  Surg Hx  Fam Hx     Review of Systems:  No other skin or systemic complaints except as noted in HPI or Assessment and Plan.  Objective  Well appearing patient in no apparent distress; mood and affect are within normal limits.  A focused examination was performed including face, scalp, b/l ears . Relevant physical exam findings are noted in the Assessment and Plan.  Scalp and behind ears Crust and scale at scalp and behind ears    right and left ear x 2 (2) Erythematous thin papules/macules with gritty scale.    Assessment & Plan  Seborrheic dermatitis with pruritus of scalp and postauricular areas Scalp and behind ears  Seborrheic Dermatitis  -  is a chronic persistent rash characterized by pinkness and scaling most commonly of the mid face but also can occur on the scalp (dandruff), ears; mid chest, mid back and groin.  It tends to be exacerbated by stress and cooler weather.  People who have neurologic disease may experience new onset or exacerbation of existing seborrheic dermatitis.  The condition is not curable but treatable and can be controlled.  Start Ketoconazole shampoo 2 % - apply to scalp and behind ears 5 - 7 times per week, massage into scalp and leave for 3 - 5 minutes before rinsing out ( after a few weeks can decrease to using 3 times per week )  Start mometasone 0.1 % lotion - apply topically to aa's of scalp and behind  ears 5 days weekly at night for 2 weeks for itchy scale. If improved can decrease using to 3 days weekly at night prn)   Topical steroids (such as triamcinolone, fluocinolone, fluocinonide, mometasone, clobetasol, halobetasol, betamethasone, hydrocortisone) can cause thinning and lightening of the skin if they are used for too long in the same area. Your physician has selected the right strength medicine for your problem and area affected on the body. Please use your medication only as directed by your physician to prevent side effects.   ketoconazole (NIZORAL) 2 % shampoo - Scalp and behind ears apply to scalp and behind ears 5 - 7 times per week, massage into scalp and leave in for 3 -  5  minutes before rinsing out (after can decrease to 3 times per week) mometasone (ELOCON) 0.1 % lotion - Scalp and behind ears Apply topically to aa's of scalp and behind ears 5 days weekly at night for itchy scale for 2 weeks. If improved can decrease to  3 days weekly at night prn  Actinic keratosis (2) right and left ear x 2  Recheck in 10 weeks   Actinic keratoses are precancerous spots that appear secondary to cumulative UV radiation exposure/sun exposure over time. They are chronic with expected duration over 1 year. A portion of actinic keratoses will progress to squamous cell carcinoma of the skin. It is not possible to reliably predict which spots will progress  to skin cancer and so treatment is recommended to prevent development of skin cancer.  Recommend daily broad spectrum sunscreen SPF 30+ to sun-exposed areas, reapply every 2 hours as needed.  Recommend staying in the shade or wearing long sleeves, sun glasses (UVA+UVB protection) and wide brim hats (4-inch brim around the entire circumference of the hat). Call for new or changing lesions.  Destruction of lesion - right and left ear x 2 Complexity: simple   Destruction method: cryotherapy   Informed consent: discussed and consent obtained    Timeout:  patient name, date of birth, surgical site, and procedure verified Lesion destroyed using liquid nitrogen: Yes   Region frozen until ice ball extended beyond lesion: Yes   Outcome: patient tolerated procedure well with no complications   Post-procedure details: wound care instructions given   Additional details:  Prior to procedure, discussed risks of blister formation, small wound, skin dyspigmentation, or rare scar following cryotherapy. Recommend Vaseline ointment to treated areas while healing.  Actinic Damage - chronic, secondary to cumulative UV radiation exposure/sun exposure over time - diffuse scaly erythematous macules with underlying dyspigmentation - Recommend daily broad spectrum sunscreen SPF 30+ to sun-exposed areas, reapply every 2 hours as needed.  - Recommend staying in the shade or wearing long sleeves, sun glasses (UVA+UVB protection) and wide brim hats (4-inch brim around the entire circumference of the hat). - Call for new or changing lesions.  Return for 10 week follow up on seb derm, aks, and tbse .  IRuthell Rummage, CMA, am acting as scribe for Sarina Ser, MD. Documentation: I have reviewed the above documentation for accuracy and completeness, and I agree with the above.  Sarina Ser, MD

## 2021-12-27 ENCOUNTER — Ambulatory Visit (INDEPENDENT_AMBULATORY_CARE_PROVIDER_SITE_OTHER): Payer: Medicare Other

## 2021-12-27 DIAGNOSIS — Z1231 Encounter for screening mammogram for malignant neoplasm of breast: Secondary | ICD-10-CM

## 2021-12-27 DIAGNOSIS — Z0000 Encounter for general adult medical examination without abnormal findings: Secondary | ICD-10-CM

## 2021-12-27 DIAGNOSIS — Z780 Asymptomatic menopausal state: Secondary | ICD-10-CM | POA: Diagnosis not present

## 2021-12-27 NOTE — Patient Instructions (Signed)
Hannah Lindsey , Thank you for taking time to come for your Medicare Wellness Visit. I appreciate your ongoing commitment to your health goals. Please review the following plan we discussed and let me know if I can assist you in the future.   Screening recommendations/referrals: Colonoscopy: done 08/20/18. Repeat 08/2023 Mammogram: done 04/07/21. Please call [phone] to schedule your mammogram and bone density screening Bone Density: done 12/22/19 Recommended yearly ophthalmology/optometry visit for glaucoma screening and checkup Recommended yearly dental visit for hygiene and checkup  Vaccinations: Influenza vaccine: done 06/10/21 Pneumococcal vaccine: done 10/08/13 Tdap vaccine: done 02/29/12 Shingles vaccine: done 10/07/21 & 12/09/21   Covid-19:done 09/16/19, 10/07/19, 05/06/20 & 06/02/21  Advanced directives: Please bring a copy of your health care power of attorney and living will to the office at your convenience.   Conditions/risks identified: Keep up the great work!  Next appointment: Follow up in one year for your annual wellness visit    Preventive Care 65 Years and Older, Female Preventive care refers to lifestyle choices and visits with your health care provider that can promote health and wellness. What does preventive care include? A yearly physical exam. This is also called an annual well check. Dental exams once or twice a year. Routine eye exams. Ask your health care provider how often you should have your eyes checked. Personal lifestyle choices, including: Daily care of your teeth and gums. Regular physical activity. Eating a healthy diet. Avoiding tobacco and drug use. Limiting alcohol use. Practicing safe sex. Taking low-dose aspirin every day. Taking vitamin and mineral supplements as recommended by your health care provider. What happens during an annual well check? The services and screenings done by your health care provider during your annual well check will depend  on your age, overall health, lifestyle risk factors, and family history of disease. Counseling  Your health care provider may ask you questions about your: Alcohol use. Tobacco use. Drug use. Emotional well-being. Home and relationship well-being. Sexual activity. Eating habits. History of falls. Memory and ability to understand (cognition). Work and work Statistician. Reproductive health. Screening  You may have the following tests or measurements: Height, weight, and BMI. Blood pressure. Lipid and cholesterol levels. These may be checked every 5 years, or more frequently if you are over 110 years old. Skin check. Lung cancer screening. You may have this screening every year starting at age 60 if you have a 30-pack-year history of smoking and currently smoke or have quit within the past 15 years. Fecal occult blood test (FOBT) of the stool. You may have this test every year starting at age 63. Flexible sigmoidoscopy or colonoscopy. You may have a sigmoidoscopy every 5 years or a colonoscopy every 10 years starting at age 60. Hepatitis C blood test. Hepatitis B blood test. Sexually transmitted disease (STD) testing. Diabetes screening. This is done by checking your blood sugar (glucose) after you have not eaten for a while (fasting). You may have this done every 1-3 years. Bone density scan. This is done to screen for osteoporosis. You may have this done starting at age 69. Mammogram. This may be done every 1-2 years. Talk to your health care provider about how often you should have regular mammograms. Talk with your health care provider about your test results, treatment options, and if necessary, the need for more tests. Vaccines  Your health care provider may recommend certain vaccines, such as: Influenza vaccine. This is recommended every year. Tetanus, diphtheria, and acellular pertussis (Tdap, Td) vaccine. You may  need a Td booster every 10 years. Zoster vaccine. You may need  this after age 22. Pneumococcal 13-valent conjugate (PCV13) vaccine. One dose is recommended after age 58. Pneumococcal polysaccharide (PPSV23) vaccine. One dose is recommended after age 61. Talk to your health care provider about which screenings and vaccines you need and how often you need them. This information is not intended to replace advice given to you by your health care provider. Make sure you discuss any questions you have with your health care provider. Document Released: 08/20/2015 Document Revised: 04/12/2016 Document Reviewed: 05/25/2015 Elsevier Interactive Patient Education  2017 [place] Prevention in the Home Falls can cause injuries. They can happen to people of all ages. There are many things you can do to make your home safe and to help prevent falls. What can I do on the outside of my home? Regularly fix the edges of walkways and driveways and fix any cracks. Remove anything that might make you trip as you walk through a door, such as a raised step or threshold. Trim any bushes or trees on the path to your home. Use bright outdoor lighting. Clear any walking paths of anything that might make someone trip, such as rocks or tools. Regularly check to see if handrails are loose or broken. Make sure that both sides of any steps have handrails. Any raised decks and porches should have guardrails on the edges. Have any leaves, snow, or ice cleared regularly. Use sand or salt on walking paths during winter. Clean up any spills in your garage right away. This includes oil or grease spills. What can I do in the bathroom? Use night lights. Install grab bars by the toilet and in the tub and shower. Do not use towel bars as grab bars. Use non-skid mats or decals in the tub or shower. If you need to sit down in the shower, use a plastic, non-slip stool. Keep the floor dry. Clean up any water that spills on the floor as soon as it happens. Remove soap buildup in the tub  or shower regularly. Attach bath mats securely with double-sided non-slip rug tape. Do not have throw rugs and other things on the floor that can make you trip. What can I do in the bedroom? Use night lights. Make sure that you have a light by your bed that is easy to reach. Do not use any sheets or blankets that are too big for your bed. They should not hang down onto the floor. Have a firm chair that has side arms. You can use this for support while you get dressed. Do not have throw rugs and other things on the floor that can make you trip. What can I do in the kitchen? Clean up any spills right away. Avoid walking on wet floors. Keep items that you use a lot in easy-to-reach places. If you need to reach something above you, use a strong step stool that has a grab bar. Keep electrical cords out of the way. Do not use floor polish or wax that makes floors slippery. If you must use wax, use non-skid floor wax. Do not have throw rugs and other things on the floor that can make you trip. What can I do with my stairs? Do not leave any items on the stairs. Make sure that there are handrails on both sides of the stairs and use them. Fix handrails that are broken or loose. Make sure that handrails are as long as the stairways.  Check any carpeting to make sure that it is firmly attached to the stairs. Fix any carpet that is loose or worn. Avoid having throw rugs at the top or bottom of the stairs. If you do have throw rugs, attach them to the floor with carpet tape. Make sure that you have a light switch at the top of the stairs and the bottom of the stairs. If you do not have them, ask someone to add them for you. What else can I do to help prevent falls? Wear shoes that: Do not have high heels. Have rubber bottoms. Are comfortable and fit you well. Are closed at the toe. Do not wear sandals. If you use a stepladder: Make sure that it is fully opened. Do not climb a closed stepladder. Make  sure that both sides of the stepladder are locked into place. Ask someone to hold it for you, if possible. Clearly mark and make sure that you can see: Any grab bars or handrails. First and last steps. Where the edge of each step is. Use tools that help you move around (mobility aids) if they are needed. These include: Canes. Walkers. Scooters. Crutches. Turn on the lights when you go into a dark area. Replace any light bulbs as soon as they burn out. Set up your furniture so you have a clear path. Avoid moving your furniture around. If any of your floors are uneven, fix them. If there are any pets around you, be aware of where they are. Review your medicines with your doctor. Some medicines can make you feel dizzy. This can increase your chance of falling. Ask your doctor what other things that you can do to help prevent falls. This information is not intended to replace advice given to you by your health care provider. Make sure you discuss any questions you have with your health care provider. Document Released: 05/20/2009 Document Revised: 12/30/2015 Document Reviewed: 08/28/2014 Elsevier Interactive Patient Education  2017 [employer].

## 2021-12-27 NOTE — Progress Notes (Signed)
Subjective:   Hannah Lindsey is a 76 y.o. female who presents for Medicare Annual (Subsequent) preventive examination.  Virtual Visit via Telephone Note  I connected with  Hannah Lindsey on 12/27/21 at  3:30 PM EDT by telephone and verified that I am speaking with the correct person using two identifiers.  Location: Patient: home Provider: [place] Persons participating in the virtual visit: [place]   I discussed the limitations, risks, security and privacy concerns of performing an evaluation and management service by telephone and the availability of in person appointments. The patient expressed understanding and agreed to proceed.  Interactive audio and video telecommunications were attempted between this nurse and patient, however failed, due to patient having technical difficulties OR patient did not have access to video capability.  We continued and completed visit with audio only.  Some vital signs may be absent or patient reported.   Clemetine Marker, LPN   Review of Systems     Cardiac Risk Factors include: advanced age (>101mn, >>83women);dyslipidemia;hypertension     Objective:    There were no vitals filed for this visit. There is no height or weight on file to calculate BMI.     12/27/2021    3:35 PM 12/23/2020   11:29 AM 11/30/2020    9:25 AM 03/15/2020   10:07 AM 11/18/2019    9:05 AM 08/19/2018    9:25 AM 11/09/2017   11:02 AM  Advanced Directives  Does Patient Have a Medical Advance Directive? Yes Yes No Yes Yes No No  Type of AParamedicof ACape MearesLiving will HPepinLiving will  HKings ParkLiving will HVan BurenLiving will    Copy of HShieldsin Chart? No - copy requested No - copy requested   No - copy requested    Would patient like information on creating a medical advance directive?       Yes (MAU/Ambulatory/Procedural Areas -  Information given)    Current Medications (verified) Outpatient Encounter Medications as of 12/27/2021  Medication Sig   amLODipine (NORVASC) 10 MG tablet Take 1 tablet (10 mg total) by mouth daily.   baclofen (LIORESAL) 10 MG tablet TAKE 1 TABLET BY MOUTH DAILY AS NEEDED FOR MUSCLE SPASMS.   diclofenac Sodium (VOLTAREN) 1 % GEL Apply 2 g topically 4 (four) times daily.   fluticasone (FLONASE) 50 MCG/ACT nasal spray SPRAY 2 SPRAYS INTO EACH NOSTRIL EVERY DAY   ketoconazole (NIZORAL) 2 % shampoo apply to scalp and behind ears 5 - 7 times per week, massage into scalp and leave in for 3 -  5  minutes before rinsing out (after can decrease to 3 times per week)   loratadine (CLARITIN) 10 MG tablet Take 10 mg by mouth daily.   mometasone (ELOCON) 0.1 % lotion Apply topically to aa's of scalp and behind ears 5 days weekly at night for itchy scale for 2 weeks. If improved can decrease to  3 days weekly at night prn   rosuvastatin (CRESTOR) 20 MG tablet Take 1 tablet (20 mg total) by mouth daily.   valsartan-hydrochlorothiazide (DIOVAN-HCT) 80-12.5 MG tablet Take 1 tablet by mouth daily.   No facility-administered encounter medications on file as of 12/27/2021.    Allergies (verified) Patient has no known allergies.   History: Past Medical History:  Diagnosis Date   Allergy    Anxiety, generalized    Hyperglycemia    Hyperlipidemia    Hypertension  Past Surgical History:  Procedure Laterality Date   COLONOSCOPY WITH PROPOFOL N/A 08/19/2018   Procedure: COLONOSCOPY WITH PROPOFOL;  Surgeon: Manya Silvas, MD;  Location: [facility] ENDOSCOPY;  Service: Endoscopy;  Laterality: N/A;   TONSILLECTOMY     Family History  Problem Relation Age of Onset   Fibromyalgia Mother    Heart Problems Mother    Dementia Father    Heart Problems Father    Heart disease Father    Breast cancer Maternal Grandmother    Cancer Maternal Grandmother    Cancer Maternal Aunt    Breast cancer Maternal Aunt     Social History   Socioeconomic History   Marital status: Married    Spouse name: John   Number of children: 1   Years of education: Not on file   Highest education level: Bachelor's degree (e.g., BA, AB, BS)  Occupational History   Not on file  Tobacco Use   Smoking status: Never   Smokeless tobacco: Never  Vaping Use   Vaping Use: Never used  Substance and Sexual Activity   Alcohol use: Yes    Alcohol/week: 1.0 standard drink    Types: 1 Glasses of wine per week    Comment: nightly   Drug use: No   Sexual activity: Not Currently    Partners: Male    Birth control/protection: Post-menopausal  Other Topics Concern   Not on file  Social History Narrative   Not on file   Social Determinants of Health   Financial Resource Strain: Low Risk    Difficulty of Paying Living Expenses: Not hard at all  Food Insecurity: No Food Insecurity   Worried About Charity fundraiser in the Last Year: Never true   [place] in the Last Year: Never true  Transportation Needs: No Transportation Needs   Lack of Transportation (Medical): No   Lack of Transportation (Non-Medical): No  Physical Activity: Sufficiently Active   Days of Exercise per Week: 7 days   Minutes of Exercise per Session: 30 min  Stress: No Stress Concern Present   Feeling of Stress : Not at all  Social Connections: Moderately Integrated   Frequency of Communication with Friends and Family: More than three times a week   Frequency of Social Gatherings with Friends and Family: More than three times a week   Attends Religious Services: Never   Marine scientist or Organizations: Yes   Attends Music therapist: More than 4 times per year   Marital Status: Married    Tobacco Counseling Counseling given: Not Answered   Clinical Intake:  Pre-visit preparation completed: Yes  Pain : No/denies pain     Nutritional Risks: None Diabetes: No  How often do you need to have someone help you  when you read instructions, pamphlets, or other written materials from your doctor or pharmacy?: 1 - Never    Interpreter Needed?: No  Information entered by :: Clemetine Marker LPN   Activities of Daily Living    12/27/2021    3:36 PM 12/06/2021    8:48 AM  In your present state of health, do you have any difficulty performing the following activities:  Hearing? 0 0  Vision? 0 0  Difficulty concentrating or making decisions? 0 0  Walking or climbing stairs? 0 0  Dressing or bathing? 0 0  Doing errands, shopping? 0 0  Preparing Food and eating ? N   Using the Toilet? N   In the  past six months, have you accidently leaked urine? N   Do you have problems with loss of bowel control? N   Managing your Medications? N   Managing your Finances? N   Housekeeping or managing your Housekeeping? N     Patient Care Team: Steele Sizer, MD as PCP - General (Family Medicine) Anabel Bene, MD as Referring Physician (Neurology)  Indicate any recent Medical Services you may have received from other than [facility] providers in the past year (date may be approximate).     Assessment:   This is a routine wellness examination for Xuan.  Hearing/Vision screen Hearing Screening - Comments:: Pt denies hearing difficulty  Vision Screening - Comments:: Vision screenings done at [facility] issues and exercise activities discussed: Current Exercise Habits: Home exercise routine, Type of exercise: walking, Time (Minutes): 30, Frequency (Times/Week): 7, Weekly Exercise (Minutes/Week): 210, Intensity: Moderate, Exercise limited by: None identified   Goals Addressed             This Visit's Progress    DIET - INCREASE WATER INTAKE   On track    Recommend to drink at least 6-8 8oz glasses of water per day.        Depression Screen    12/27/2021    3:35 PM 12/06/2021    8:48 AM 06/10/2021    8:32 AM 12/23/2020   11:28 AM 12/08/2020    8:05 AM 06/08/2020    2:04 PM 12/23/2019     8:03 AM  PHQ 2/9 Scores  PHQ - 2 Score 0 0 0 0 0 0 0  PHQ- 9 Score   0    1    Fall Risk    12/27/2021    3:36 PM 12/06/2021    8:48 AM 06/10/2021    8:32 AM 12/23/2020   11:31 AM 12/08/2020    8:05 AM  Fall Risk   Falls in the past year? 0 0 '1 1 1  '$ Number falls in past yr: 0  0 0 0  Injury with Fall? 0  '1 1 1  '$ Risk for fall due to : No Fall Risks No Fall Risks No Fall Risks History of fall(s)   Follow up Falls prevention discussed Falls prevention discussed Falls prevention discussed Falls prevention discussed     FALL RISK PREVENTION PERTAINING TO THE HOME:  Any stairs in or around the home? Yes  If so, are there any without handrails? No  Home free of loose throw rugs in walkways, pet beds, electrical cords, etc? Yes  Adequate lighting in your home to reduce risk of falls? Yes   ASSISTIVE DEVICES UTILIZED TO PREVENT FALLS:  Life alert? No  Use of a cane, walker or w/c? No  Grab bars in the bathroom? Yes  Shower chair or bench in shower? No  Elevated toilet seat or a handicapped toilet? No   TIMED UP AND GO:  Was the test performed? No . Telephonic visit.     Cognitive Function: Normal cognitive status assessed by direct observation by this Nurse Health Advisor. No abnormalities found.          11/18/2019    9:08 AM 11/09/2017   11:07 AM 05/29/2016    9:06 AM  6CIT Screen  What Year? 0 points 0 points 0 points  What month? 0 points 0 points 0 points  What time? 0 points 0 points 0 points  Count back from 20 0 points 0 points 0 points  Months in reverse 0 points 0 points 0 points  Repeat phrase 0 points 2 points 2 points  Total Score 0 points 2 points 2 points    Immunizations Immunization History  Administered Date(s) Administered   Fluad Quad(high Dose 65+) 05/14/2019, 06/08/2020, 06/10/2021   Influenza, High Dose Seasonal PF 04/19/2015, 05/29/2016, 05/09/2017, 05/10/2018   Influenza-Unspecified 04/07/2014   Moderna Covid-19 Vaccine Bivalent Booster  22yr & up 06/02/2021   PFIZER(Purple Top)SARS-COV-2 Vaccination 09/16/2019, 10/07/2019, 05/06/2020   Pneumococcal Conjugate-13 10/08/2013   Pneumococcal Polysaccharide-23 02/29/2012   Tdap 02/29/2012   Zoster Recombinat (Shingrix) 10/07/2021, 12/09/2021   Zoster, Live 04/14/2013    TDAP status: Up to date  Flu Vaccine status: Up to date  Pneumococcal vaccine status: Up to date  Covid-19 vaccine status: Completed vaccines  Qualifies for Shingles Vaccine? Yes   Zostavax completed Yes   Shingrix Completed?: Yes  Screening Tests Health Maintenance  Topic Date Due   TETANUS/TDAP  02/28/2022   INFLUENZA VACCINE  03/07/2022   MAMMOGRAM  04/07/2022   COLONOSCOPY (Pts 45-450yrInsurance coverage will need to be confirmed)  08/21/2023   Pneumonia Vaccine 6572Years old  Completed   DEXA SCAN  Completed   COVID-19 Vaccine  Completed   Hepatitis C Screening  Completed   Zoster Vaccines- Shingrix  Completed   HPV VACCINES  Aged Out    Health Maintenance  There are no preventive care reminders to display for this patient.  Colorectal cancer screening: Type of screening: Colonoscopy. Completed 08/20/18. Repeat every 5 years  Mammogram status: Completed 04/07/21. Repeat every year  Bone Density status: Completed 12/22/19. Results reflect: Bone density results: OSTEOPENIA. Repeat every 2 years.  Lung Cancer Screening: (Low Dose CT Chest recommended if Age 75-80ears, 30 pack-year currently smoking OR have quit w/in 15years.) does not qualify.    Additional Screening:  Hepatitis C Screening: does qualify; Completed 05/29/16  Vision Screening: Recommended annual ophthalmology exams for early detection of glaucoma and other disorders of the eye. Is the patient up to date with their annual eye exam?  Yes  Who is the provider or what is the name of the office in which the patient attends annual eye exams? Dr. BaMallie Musselplans to [facility]  Dental  Screening: Recommended annual dental exams for proper oral hygiene  Community Resource Referral / Chronic Care Management: CRR required this visit?  No   CCM required this visit?  No      Plan:     I have personally reviewed and noted the following in the patient's chart:   Medical and social history Use of alcohol, tobacco or illicit drugs  Current medications and supplements including opioid prescriptions.  Functional ability and status Nutritional status Physical activity Advanced directives List of other physicians Hospitalizations, surgeries, and ER visits in previous 12 months Vitals Screenings to include cognitive, depression, and falls Referrals and appointments  In addition, I have reviewed and discussed with patient certain preventive protocols, quality metrics, and best practice recommendations. A written personalized care plan for preventive services as well as general preventive health recommendations were provided to patient.     KaClemetine MarkerLPN   12/08/27/5188 Nurse Notes: none

## 2022-01-02 ENCOUNTER — Other Ambulatory Visit: Payer: Self-pay | Admitting: Family Medicine

## 2022-01-02 ENCOUNTER — Encounter: Payer: Self-pay | Admitting: Dermatology

## 2022-01-02 DIAGNOSIS — I10 Essential (primary) hypertension: Secondary | ICD-10-CM

## 2022-01-06 ENCOUNTER — Ambulatory Visit
Admission: RE | Admit: 2022-01-06 | Discharge: 2022-01-06 | Disposition: A | Discharge status: Home / Self Care | Payer: No Typology Code available for payment source | Source: Ambulatory Visit | Attending: Family Medicine | Admitting: Family Medicine

## 2022-01-06 DIAGNOSIS — Z006 Encounter for examination for normal comparison and control in clinical research program: Secondary | ICD-10-CM

## 2022-01-06 IMAGING — MR MR HEAD W/O CM
7 series · 48 of 48 positions shown · non-contrast
Comparison: MRI [DATE].
COMPARISON: MRI [DATE].

Addendum:
CLINICAL DATA: [employer] clinical research.

EXAM:
MRI HEAD WITHOUT CONTRAST
TECHNIQUE: Multiplanar, multiecho pulse sequences of the brain and surrounding
structures were obtained without intravenous contrast.
CLINICAL HISTORY: Clinical research study.
*** End of Addendum ***

[Series 2: 3dt1 sag · sagittal · 1.2mm · 1.25mm/px · 18 of 175 slices shown]
[im 1/175]
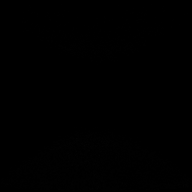
[im 11/175]
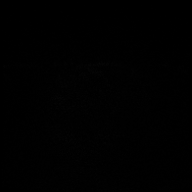
[im 21/175]
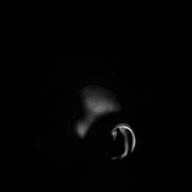
[im 31/175]
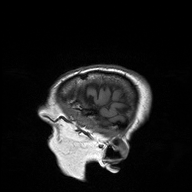
[im 41/175]
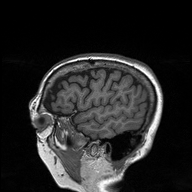
[im 52/175]
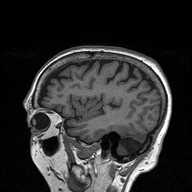
[im 62/175]
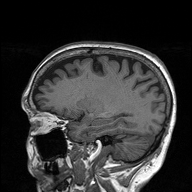
[im 72/175]
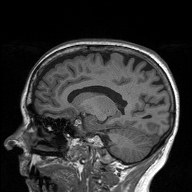
[im 82/175]
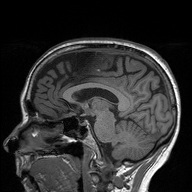
[im 93/175]
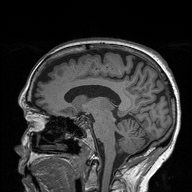
[im 103/175]
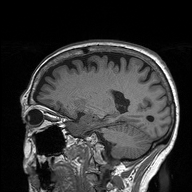
[im 113/175]
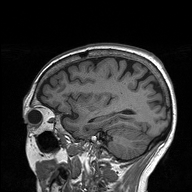
[im 123/175]
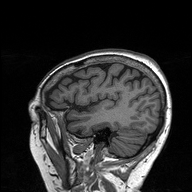
[im 134/175]
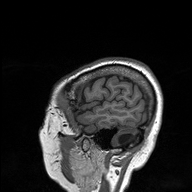
[im 144/175]
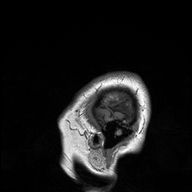
[im 154/175]
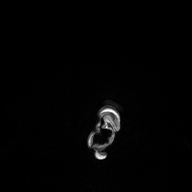
[im 164/175]
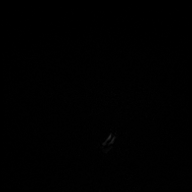
[im 175/175]
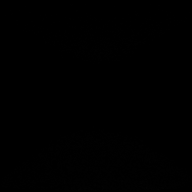

[Series 3: FLAIR · axial · 5.0mm · 0.94mm/px · z∈[-66,+92]mm · 3 of 30 slices shown]
[im 1/30]
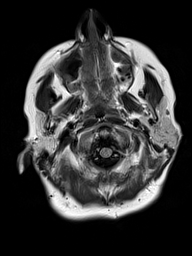
[im 15/30]
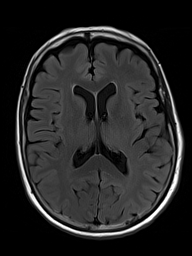
[im 30/30]
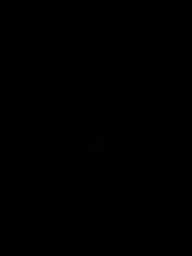

[Series 4: T2-star · axial · 5.0mm · 0.94mm/px · z∈[-66,+92]mm · 3 of 30 slices shown]
[im 1/30]
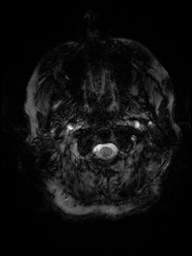
[im 15/30]
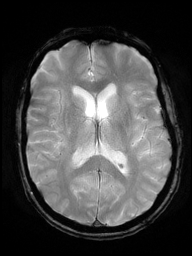
[im 30/30]
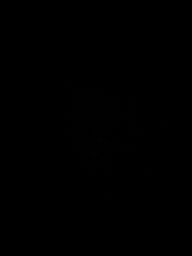

[Series 5: T2 · axial · 5.0mm · 0.94mm/px · z∈[-66,+92]mm · 3 of 30 slices shown]
[im 1/30]
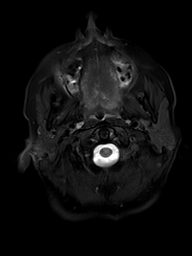
[im 15/30]
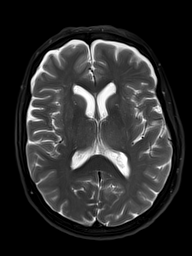
[im 30/30]
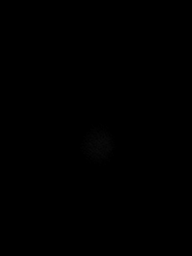

[Series 6: DWI · axial · 5.0mm · 0.94mm/px · z∈[-67,+92]mm · 12 of 120 slices shown]
[im 1/120]
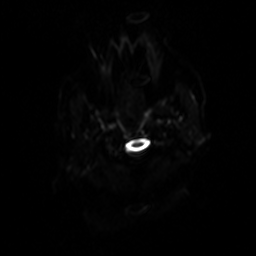
[im 11/120]
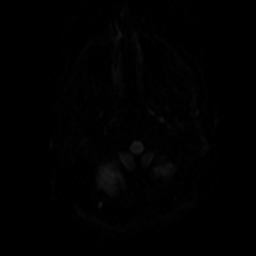
[im 22/120]
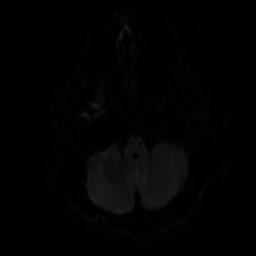
[im 33/120]
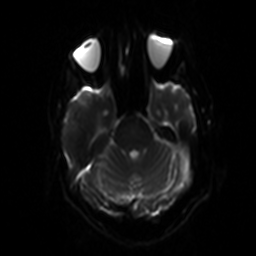
[im 44/120]
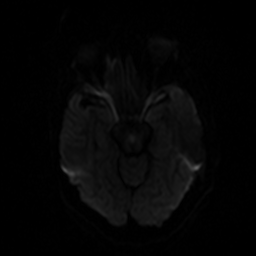
[im 55/120]
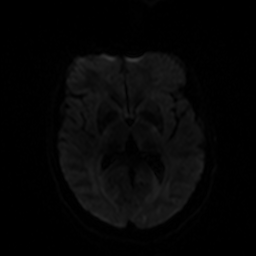
[im 65/120]
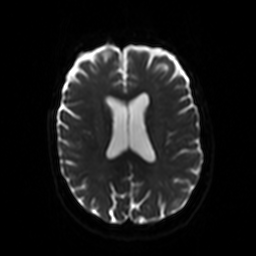
[im 76/120]
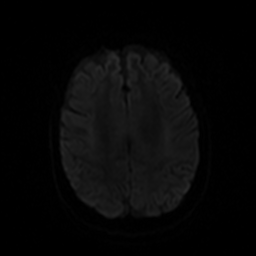
[im 87/120]
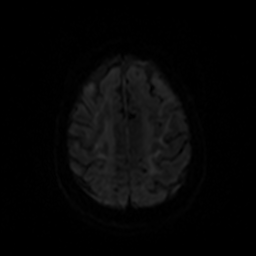
[im 98/120]
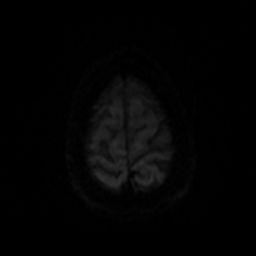
[im 109/120]
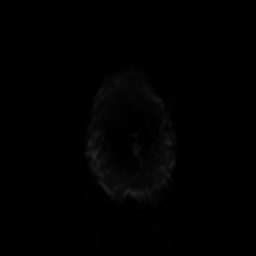
[im 120/120]
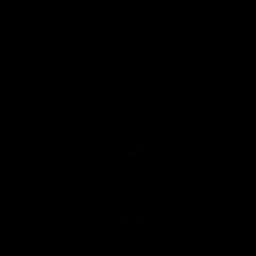

[Series 7: ax dwi_tracew · axial · 5.0mm · 0.94mm/px · z∈[-67,+92]mm · 6 of 60 slices shown]
[im 1/60]
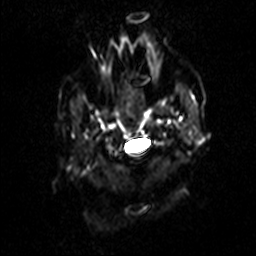
[im 12/60]
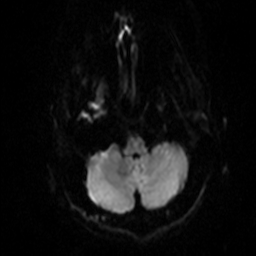
[im 24/60]
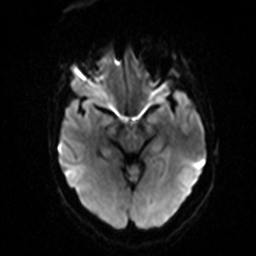
[im 36/60]
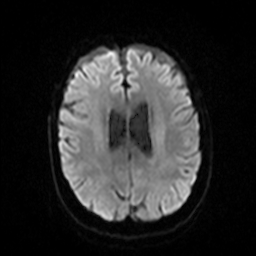
[im 48/60]
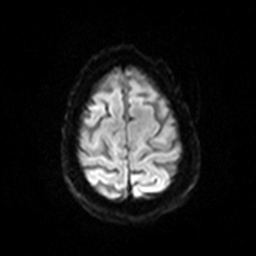
[im 60/60]
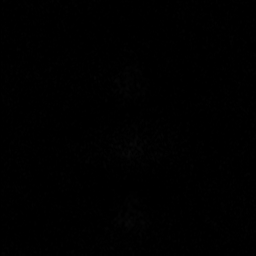

[Series 8: ax dwi_adc · axial · 5.0mm · 0.94mm/px · z∈[-67,+92]mm · 3 of 30 slices shown]
[im 1/30]
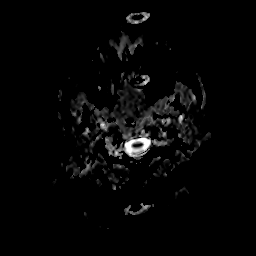
[im 15/30]
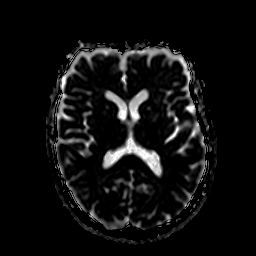
[im 30/30]
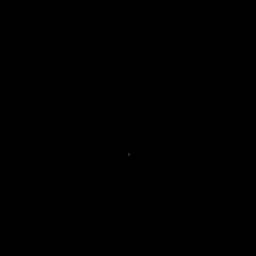

[48 of 48 positions shown; findings below may reference images not displayed]

FINDINGS: Limited research protocol.

Brain: No acute infarction, hemorrhage, hydrocephalus, extra-axial
collection or mass lesion.

Vascular: Major arterial flow voids are maintained at the skull
base.

Skull and upper cervical spine: Normal marrow signal.

Sinuses/Orbits: Clear sinuses.  No acute orbital findings.

Other: No mastoid effusions.
IMPRESSION: No evidence of acute intracranial abnormality.  No change.

ADDENDUM:
FINDINGS: Limited research protocol.

Brain: No acute infarction, hemorrhage, hydrocephalus, extra-axial
collection or mass lesion.

Vascular: Major arterial flow voids are maintained at the skull
base.

Skull and upper cervical spine: Normal marrow signal.

Sinuses/Orbits: Clear sinuses.  No acute orbital findings.

Other: No mastoid effusions.
IMPRESSION: No evidence of acute intracranial abnormality.  No change.

## 2022-01-25 ENCOUNTER — Other Ambulatory Visit: Payer: Self-pay | Admitting: Family Medicine

## 2022-01-25 DIAGNOSIS — J302 Other seasonal allergic rhinitis: Secondary | ICD-10-CM

## 2022-01-30 ENCOUNTER — Other Ambulatory Visit: Payer: Self-pay | Admitting: Family Medicine

## 2022-01-30 DIAGNOSIS — J302 Other seasonal allergic rhinitis: Secondary | ICD-10-CM

## 2022-03-01 ENCOUNTER — Other Ambulatory Visit: Payer: Self-pay | Admitting: Family Medicine

## 2022-03-01 DIAGNOSIS — I10 Essential (primary) hypertension: Secondary | ICD-10-CM

## 2022-03-06 ENCOUNTER — Ambulatory Visit: Payer: Medicare Other | Admitting: Dermatology

## 2022-03-06 ENCOUNTER — Encounter: Payer: Self-pay | Admitting: Dermatology

## 2022-03-06 ENCOUNTER — Other Ambulatory Visit: Payer: Self-pay | Admitting: Family Medicine

## 2022-03-06 ENCOUNTER — Encounter: Payer: Self-pay | Admitting: Family Medicine

## 2022-03-06 DIAGNOSIS — L578 Other skin changes due to chronic exposure to nonionizing radiation: Secondary | ICD-10-CM | POA: Diagnosis not present

## 2022-03-06 DIAGNOSIS — L219 Seborrheic dermatitis, unspecified: Secondary | ICD-10-CM | POA: Diagnosis not present

## 2022-03-06 DIAGNOSIS — L821 Other seborrheic keratosis: Secondary | ICD-10-CM | POA: Diagnosis not present

## 2022-03-06 DIAGNOSIS — Z872 Personal history of diseases of the skin and subcutaneous tissue: Secondary | ICD-10-CM

## 2022-03-06 DIAGNOSIS — L814 Other melanin hyperpigmentation: Secondary | ICD-10-CM

## 2022-03-06 NOTE — Patient Instructions (Addendum)
Seborrheic Dermatitis  -  is a chronic persistent rash characterized by pinkness and scaling most commonly of the mid face but also can occur on the scalp (dandruff), ears; mid chest, mid back and groin.  It tends to be exacerbated by stress and cooler weather.  People who have neurologic disease may experience new onset or exacerbation of existing seborrheic dermatitis.  The condition is not curable but treatable and can be controlled.   Due to recent changes in healthcare laws, you may see results of your pathology and/or laboratory studies on MyChart before the doctors have had a chance to review them. We understand that in some cases there may be results that are confusing or concerning to you. Please understand that not all results are received at the same time and often the doctors may need to interpret multiple results in order to provide you with the best plan of care or course of treatment. Therefore, we ask that you please give [place] 2 business days to thoroughly review all your results before contacting the office for clarification. Should we see a critical lab result, you will be contacted sooner.   If You Need Anything After Your Visit  If you have any questions or concerns for your doctor, please call our main line at [phone] and press option 4 to reach your doctor's medical assistant. If no one answers, please leave a voicemail as directed and we will return your call as soon as possible. Messages left after 4 pm will be answered the following business day.   You may also send [place] a message via [place]. We typically respond to MyChart messages within 1-2 business days.  For prescription refills, please ask your pharmacy to contact our office. Our fax number is [phone].  If you have an urgent issue when the clinic is closed that cannot wait until the next business day, you can page your doctor at the number below.    Please note that while we do our best to be available for  urgent issues outside of office hours, we are not available 24/7.   If you have an urgent issue and are unable to reach [place], you may choose to seek medical care at your doctor's office, retail clinic, urgent care center, or emergency room.  If you have a medical emergency, please immediately call 911 or go to the emergency department.  Pager Numbers  - Dr. Nehemiah Massed: [phone]  - Dr. Laurence Ferrari: [phone]  - Dr. Nicole Kindred: [phone]  In the event of inclement weather, please call our main line at [phone] for an update on the status of any delays or closures.  Dermatology Medication Tips: Please keep the boxes that topical medications come in in order to help keep track of the instructions about where and how to use these. Pharmacies typically print the medication instructions only on the boxes and not directly on the medication tubes.   If your medication is too expensive, please contact our office at [phone] option 4 or send [place] a message through [place].   We are unable to tell what your co-pay for medications will be in advance as this is different depending on your insurance coverage. However, we may be able to find a substitute medication at lower cost or fill out paperwork to get insurance to cover a needed medication.   If a prior authorization is required to get your medication covered by your insurance company, please allow [place] 1-2 business days to complete this process.  Drug prices  often vary depending on where the prescription is filled and some pharmacies may offer cheaper prices.  The website www.goodrx.com contains coupons for medications through different pharmacies. The prices here do not account for what the cost may be with help from insurance (it may be cheaper with your insurance), but the website can give you the price if you did not use any insurance.  - You can print the associated coupon and take it with your prescription to the pharmacy.  - You may also  stop by our office during regular business hours and pick up a GoodRx coupon card.  - If you need your prescription sent electronically to a different pharmacy, notify our office through [facility] MyChart or by phone at [phone] option 4.     Si Usted Necesita Algo Despus de Su Visita  Tambin puede enviarnos un mensaje a travs de MyChart. Por lo general respondemos a los mensajes de MyChart en el transcurso de 1 a 2 das hbiles.  Para renovar recetas, por favor pida a su farmacia que se ponga en contacto con nuestra oficina. Nuestro nmero de fax es el [phone].  Si tiene un asunto urgente cuando la clnica est cerrada y que no puede esperar hasta el siguiente da hbil, puede llamar/localizar a su doctor(a) al nmero que aparece a continuacin.   Por favor, tenga en cuenta que aunque hacemos todo lo posible para estar disponibles para asuntos urgentes fuera del horario de oficina, no estamos disponibles las 24 horas del da, los 7 das de la semana.   Si tiene un problema urgente y no puede comunicarse con nosotros, puede optar por buscar atencin mdica  en el consultorio de su doctor(a), en una clnica privada, en un centro de atencin urgente o en una sala de emergencias.  Si tiene una emergencia mdica, por favor llame inmediatamente al 911 o vaya a la sala de emergencias.  Nmeros de bper  - Dr. Kowalski: [phone]  - Dra. Moye: [phone]  - Dra. Stewart: [phone]  En caso de inclemencias del tiempo, por favor llame a nuestra lnea principal al [phone] para una actualizacin sobre el estado de cualquier retraso o cierre.  Consejos para la medicacin en dermatologa: Por favor, guarde las cajas en las que vienen los medicamentos de uso tpico para ayudarle a seguir las instrucciones sobre dnde y cmo usarlos. Las farmacias generalmente imprimen las instrucciones del medicamento slo en las cajas y no directamente en los tubos del medicamento.   Si  su medicamento es muy caro, por favor, pngase en contacto con nuestra oficina llamando al [phone] y presione la opcin 4 o envenos un mensaje a travs de MyChart.   No podemos decirle cul ser su copago por los medicamentos por adelantado ya que esto es diferente dependiendo de la cobertura de su seguro. Sin embargo, es posible que podamos encontrar un medicamento sustituto a menor costo o llenar un formulario para que el seguro cubra el medicamento que se considera necesario.   Si se requiere una autorizacin previa para que su compaa de seguros cubra su medicamento, por favor permtanos de 1 a 2 das hbiles para completar este proceso.  Los precios de los medicamentos varan con frecuencia dependiendo del lugar de dnde se surte la receta y alguna farmacias pueden ofrecer precios ms baratos.  El sitio web www.goodrx.com tiene cupones para medicamentos de diferentes farmacias. Los precios aqu no tienen en cuenta lo que podra costar con la ayuda del seguro (puede ser ms barato con   su seguro), pero el sitio web puede darle el precio si no Field seismologist.  - Puede imprimir el cupn correspondiente y llevarlo con su receta a la farmacia.  - Tambin puede pasar por nuestra oficina durante el horario de atencin regular y Charity fundraiser una tarjeta de cupones de GoodRx.  - Si necesita que su receta se enve electrnicamente a una farmacia diferente, informe a nuestra oficina a travs de MyChart de [facility] o por telfono llamando al [phone] y presione la opcin 4.

## 2022-03-06 NOTE — Progress Notes (Signed)
   Follow-Up Visit   Subjective  Hannah Lindsey is a 76 y.o. female who presents for the following: Actinic Keratosis (Right ear, left ear treated with LN2) and Follow-up (Seb Derm follow up - Mometasone lotion prn, Ketoconazole 2% shampoo every 2 days). The patient has spots, moles and lesions to be evaluated, some may be new or changing and the patient has concerns that these could be cancer.  The following portions of the chart were reviewed this encounter and updated as appropriate:   Tobacco  Allergies  Meds  Problems  Med Hx  Surg Hx  Fam Hx     Review of Systems:  No other skin or systemic complaints except as noted in HPI or Assessment and Plan.  Objective  Well appearing patient in no apparent distress; mood and affect are within normal limits.  A focused examination was performed including scalp, face, ears. Relevant physical exam findings are noted in the Assessment and Plan.  Left Ear, Right Ear Clear today   Assessment & Plan   Actinic Damage - chronic, secondary to cumulative UV radiation exposure/sun exposure over time - diffuse scaly erythematous macules with underlying dyspigmentation - Recommend daily broad spectrum sunscreen SPF 30+ to sun-exposed areas, reapply every 2 hours as needed.  - Recommend staying in the shade or wearing long sleeves, sun glasses (UVA+UVB protection) and wide brim hats (4-inch brim around the entire circumference of the hat). - Call for new or changing lesions.  Seborrheic Keratoses - Stuck-on, waxy, tan-brown papules and/or plaques  - Benign-appearing - Discussed benign etiology and prognosis. - Observe - Call for any changes  Lentigines - Scattered tan macules - Due to sun exposure - Benign-appering, observe - Recommend daily broad spectrum sunscreen SPF 30+ to sun-exposed areas, reapply every 2 hours as needed. - Call for any changes  Seborrheic dermatitis Scalp  Seborrheic Dermatitis  -  is a chronic  persistent rash characterized by pinkness and scaling most commonly of the mid face but also can occur on the scalp (dandruff), ears; mid chest, mid back and groin.  It tends to be exacerbated by stress and cooler weather.  People who have neurologic disease may experience new onset or exacerbation of existing seborrheic dermatitis.  The condition is not curable but treatable and can be controlled.   Continue Ketoconazole 2% shampoo - advised patient she may alternate with zinc, sal acid or tar shampoo. Continue Mometasone lotion prn  Related Medications ketoconazole (NIZORAL) 2 % shampoo apply to scalp and behind ears 5 - 7 times per week, massage into scalp and leave in for 3 -  5  minutes before rinsing out (after can decrease to 3 times per week)  mometasone (ELOCON) 0.1 % lotion Apply topically to aa's of scalp and behind ears 5 days weekly at night for itchy scale for 2 weeks. If improved can decrease to  3 days weekly at night prn  History of actinic keratoses Left Ear, Right Ear  Clear. Observe for recurrence. Call clinic for new or changing lesions.  Recommend regular skin exams, daily broad-spectrum spf 30+ sunscreen use, and photoprotection.      Return in about 1 year (around 03/07/2023).  I, Ashok Cordia, CMA, am acting as scribe for Sarina Ser, MD . Documentation: I have reviewed the above documentation for accuracy and completeness, and I agree with the above.  Sarina Ser, MD

## 2022-06-01 ENCOUNTER — Ambulatory Visit
Admission: RE | Admit: 2022-06-01 | Discharge: 2022-06-01 | Disposition: A | Discharge status: Home / Self Care | Payer: Medicare Other | Source: Ambulatory Visit | Attending: Family Medicine | Admitting: Family Medicine

## 2022-06-01 DIAGNOSIS — Z1231 Encounter for screening mammogram for malignant neoplasm of breast: Secondary | ICD-10-CM | POA: Diagnosis not present

## 2022-06-01 DIAGNOSIS — Z780 Asymptomatic menopausal state: Secondary | ICD-10-CM | POA: Insufficient documentation

## 2022-06-02 ENCOUNTER — Other Ambulatory Visit: Payer: Self-pay | Admitting: Family Medicine

## 2022-06-02 DIAGNOSIS — I10 Essential (primary) hypertension: Secondary | ICD-10-CM

## 2022-06-02 NOTE — Telephone Encounter (Signed)
Pt has enough meds

## 2022-06-07 NOTE — Progress Notes (Unsigned)
Name: Hannah Lindsey   MRN: [id number]    DOB: August 26, 1945   Date:06/08/2022       Progress Note  Subjective  Chief Complaint  Follow up   HPI  HTN:she is on Norvasc 10 mg and higher dose of Diovan 160 /12.5, but BP was still towards low end of normal, we decreased dose of diovan  to 80/12.5 She denies dizziness, chest pain or palpitation. BP still towards low end of normal , we will try to decrease  dose of norvasc to 5 mg and monitor if needed we will double diovan dose again    Headache: still present, top of the head, intermittent, nortriptyline did not really help and caused constipation, so she stopped medication.  Mild and throbbing like pain radiates from left  parietal area to her nose but now is more like a dull ache and no longer constant  She states mother and two siblings with Parkinson's disease. Marland Kitchen  She was seen by neurologist , Dr. Melrose Nakayama, she was given Effexor two 75 mg daily , she states symptoms improved temporarily but had to keep increasing dose, she had a problem filling rx and had severe withdrawal so she decided to stop taking. She had multiple MRI's and normal . She is not interested in taking anything well. 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 Discussed Alendronate, she will see her dentist, resume high calcium diet and vitamin D 2000 units daily   Perennial allergic rhinitis: she uses  flonase prn and loratadine daily , symptoms are stable.    Senile Purpura: doing better now, but still bruises on arms, unchanged   Seborrhea capitis, using otc selsum blue and stable   Patient Active Problem List   Diagnosis Date Noted   Senile purpura ([place]) 12/08/2020   Tinnitus of both ears 12/07/2020   Anxiety, generalized 10/20/2020   Chronic daily headache 08/22/2020   Perennial allergic rhinitis with seasonal variation 11/09/2017   Osteopenia after menopause 11/09/2017   Post-menopausal 09/06/2016   Hyperlipidemia 06/19/2016   Essential hypertension 06/04/2015    Past Surgical History:  Procedure Laterality Date   COLONOSCOPY WITH PROPOFOL N/A 08/19/2018   Procedure: COLONOSCOPY WITH PROPOFOL;  Surgeon: Manya Silvas, MD;  Location: [facility] ENDOSCOPY;  Service: Endoscopy;  Laterality: N/A;   TONSILLECTOMY      Family History  Problem Relation Age of Onset   Fibromyalgia Mother    Heart Problems Mother    Dementia Father    Heart Problems Father    Heart disease Father    Breast cancer Maternal Grandmother    Cancer Maternal Grandmother    Cancer Maternal Aunt    Breast cancer Maternal Aunt     Social History   Tobacco Use   Smoking status: Never   Smokeless tobacco: Never  Substance Use Topics   Alcohol use: Yes  Alcohol/week: 1.0 standard drink of alcohol    Types: 1 Glasses of wine per week    Comment: nightly     Current Outpatient Medications:    amLODipine (NORVASC) 10 MG tablet, Take 1 tablet (10 mg total) by mouth daily., Disp: 90 tablet, Rfl: 1   baclofen (LIORESAL) 10 MG tablet, TAKE 1 TABLET BY MOUTH DAILY AS NEEDED FOR MUSCLE SPASMS., Disp: 90 tablet, Rfl: 0   diclofenac Sodium (VOLTAREN) 1 % GEL, Apply 2 g topically 4 (four) times daily., Disp: 100 g, Rfl: 1   fluticasone (FLONASE) 50 MCG/ACT nasal spray, SPRAY 2 SPRAYS INTO EACH NOSTRIL EVERY DAY, Disp: 16 g, Rfl: 1   ketoconazole (NIZORAL) 2 % shampoo, apply to scalp and behind ears 5 -  7 times per week, massage into scalp and leave in for 3 -  5  minutes before rinsing out (after can decrease to 3 times per week), Disp: 120 mL, Rfl: 11   loratadine (CLARITIN) 10 MG tablet, Take 10 mg by mouth daily., Disp: , Rfl:    mometasone (ELOCON) 0.1 % lotion, Apply topically to aa's of scalp and behind ears 5 days weekly at night for itchy scale for 2 weeks. If improved can decrease to  3 days weekly at night prn, Disp: 60 mL, Rfl: 11   rosuvastatin (CRESTOR) 20 MG tablet, Take 1 tablet (20 mg total) by mouth daily., Disp: 90 tablet, Rfl: 1   valsartan-hydrochlorothiazide (DIOVAN-HCT) 80-12.5 MG tablet, Take 1 tablet by mouth daily., Disp: 90 tablet, Rfl: 1  No Known Allergies  I personally reviewed active problem list, medication list, allergies, family history, social history with the patient/caregiver today.   ROS  Constitutional: Negative for fever or weight change.  Respiratory: Negative for cough and shortness of breath.   Cardiovascular: Negative for chest pain or palpitations.  Gastrointestinal: Negative for abdominal pain, no bowel changes.  Musculoskeletal: Negative for gait problem or joint swelling.  Skin: Negative for rash.  Neurological: Negative for dizziness or headache.  No other specific complaints in a complete review of systems (except as listed in HPI above).   Objective  Vitals:   06/08/22 0808  BP: 118/72  Pulse: 77  Resp: 16  Temp: 97.6 F (36.4 C)  TempSrc: Oral  SpO2: 94%  Weight: 166 lb 1.6 oz (75.3 kg)  Height: '5\' 5"'$  (1.651 m)    Body mass index is 27.64 kg/m.  Physical Exam  Constitutional: Patient appears well-developed and well-nourished.  No distress.  HEENT: head atraumatic, normocephalic, pupils equal and reactive to light, neck supple Cardiovascular: Normal rate, regular rhythm and normal heart sounds.  No murmur heard. No BLE edema. Pulmonary/Chest: Effort normal and breath sounds normal. No respiratory distress. Abdominal:  Soft.  There is no tenderness. Psychiatric: Patient has a normal mood and affect. behavior is normal. Judgment and thought content normal.   PHQ2/9:    06/08/2022    8:10 AM 12/27/2021    3:35 PM 12/06/2021    8:48 AM 06/10/2021    8:32 AM 12/23/2020   11:28 AM  Depression screen PHQ 2/9  Decreased Interest 0 0 0 0 0  Down, Depressed, Hopeless 0 0 0 0 0  PHQ - 2 Score 0 0 0 0 0  Altered sleeping 0   0   Tired, decreased energy 0   0   Change in appetite 0   0   Feeling bad or failure about yourself  0   0   Trouble concentrating  0   0   Moving slowly or fidgety/restless 0   0   Suicidal thoughts 0   0   PHQ-9 Score 0   0   Difficult doing work/chores Not difficult at all        phq 9 is negative   Fall Risk:    06/08/2022    8:10 AM 12/27/2021    3:36 PM 12/06/2021    8:48 AM 06/10/2021    8:32 AM 12/23/2020   11:31 AM  [place] in the past year? 0 0 0 1 1  Number falls in past yr: 0 0  0 0  Injury with Fall? 0 0  1 1  Risk for fall due to :  No Fall Risks No Fall Risks No Fall Risks History of fall(s)  Follow up  Falls prevention discussed Falls prevention discussed Falls prevention discussed Falls prevention discussed     Functional Status Survey: Is the patient deaf or have difficulty hearing?: No Does the patient have difficulty seeing, even when wearing glasses/contacts?: No Does the patient have difficulty concentrating, remembering, or making decisions?: No Does the patient have difficulty walking or climbing stairs?: No Does the patient have difficulty dressing or bathing?: No Does the patient have difficulty doing errands alone such as visiting a doctor's office or shopping?: No    Assessment & Plan   1. Essential hypertension  - amLODipine (NORVASC) 5 MG tablet; Take 1 tablet (5 mg total) by mouth daily.  Dispense: 90 tablet; Refill: 0  2. Senile purpura ([place])  Reassurance given  3. Pure hypercholesterolemia  - rosuvastatin (CRESTOR) 20 MG  tablet; Take 1 tablet (20 mg total) by mouth daily.  Dispense: 90 tablet; Refill: 1  4. Osteopenia after menopause  - alendronate (FOSAMAX) 35 MG tablet; Take 1 tablet (35 mg total) by mouth every 7 (seven) days. Take with a full glass of water on an empty stomach.  Dispense: 12 tablet; Refill: 3  5. Need for immunization against influenza  - Flu Vaccine QUAD High Dose(Fluad) - Flu Vaccine QUAD High Dose(Fluad)  6. Need for vaccination  - Tdap vaccine greater than or equal to 7yo IM  7. Vitamin D deficiency  Resume supplementation  8. Perennial allergic rhinitis with seasonal variation   9. Daily headache  Try baclofen at night   10. Seborrhea capitis

## 2022-06-08 ENCOUNTER — Ambulatory Visit (INDEPENDENT_AMBULATORY_CARE_PROVIDER_SITE_OTHER): Payer: Medicare Other | Admitting: Family Medicine

## 2022-06-08 ENCOUNTER — Encounter: Payer: Self-pay | Admitting: Family Medicine

## 2022-06-08 VITALS — BP 118/72 | HR 77 | Temp 97.6°F | Resp 16 | Ht 65.0 in | Wt 166.1 lb

## 2022-06-08 DIAGNOSIS — Z780 Asymptomatic menopausal state: Secondary | ICD-10-CM

## 2022-06-08 DIAGNOSIS — J3089 Other allergic rhinitis: Secondary | ICD-10-CM

## 2022-06-08 DIAGNOSIS — E7800 Pure hypercholesterolemia, unspecified: Secondary | ICD-10-CM

## 2022-06-08 DIAGNOSIS — E559 Vitamin D deficiency, unspecified: Secondary | ICD-10-CM

## 2022-06-08 DIAGNOSIS — I10 Essential (primary) hypertension: Secondary | ICD-10-CM

## 2022-06-08 DIAGNOSIS — D692 Other nonthrombocytopenic purpura: Secondary | ICD-10-CM | POA: Diagnosis not present

## 2022-06-08 DIAGNOSIS — J302 Other seasonal allergic rhinitis: Secondary | ICD-10-CM

## 2022-06-08 DIAGNOSIS — Z23 Encounter for immunization: Secondary | ICD-10-CM | POA: Diagnosis not present

## 2022-06-08 DIAGNOSIS — M8580 Other specified disorders of bone density and structure, unspecified site: Secondary | ICD-10-CM | POA: Diagnosis not present

## 2022-06-08 DIAGNOSIS — L210 Seborrhea capitis: Secondary | ICD-10-CM

## 2022-06-08 DIAGNOSIS — R519 Headache, unspecified: Secondary | ICD-10-CM

## 2022-06-08 MED ORDER — ALENDRONATE SODIUM 35 MG PO TABS: 35.0000 mg | ORAL_TABLET | ORAL | 3 refills | Status: DC

## 2022-06-08 MED ORDER — ROSUVASTATIN CALCIUM 20 MG PO TABS: 20.0000 mg | ORAL_TABLET | Freq: Every day | ORAL | 1 refills | Status: DC

## 2022-06-08 MED ORDER — AMLODIPINE BESYLATE 5 MG PO TABS: 5.0000 mg | ORAL_TABLET | Freq: Every day | ORAL | 0 refills | Status: DC

## 2022-06-08 NOTE — Patient Instructions (Signed)
Osteopenia  Osteopenia is a loss of thickness (density) inside the bones. Another name for osteopenia is low bone mass. Mild osteopenia is a normal part of aging. It is not a disease, and it does not cause symptoms. However, if you have osteopenia and continue to lose bone mass, you could develop a condition that causes the bones to become thin and break more easily (osteoporosis). Osteoporosis can cause you to lose some height, have back pain, and have a stooped posture. Although osteopenia is not a disease, making changes to your lifestyle and diet can help to prevent osteopenia from developing into osteoporosis. What are the causes? Osteopenia is caused by loss of calcium in the bones. Bones are constantly changing. Old bone cells are continually being replaced with new bone cells. This process builds new bone. The mineral calcium is needed to build new bone and maintain bone density. Bone density is usually highest around age 35. After that, most people's bodies cannot replace all the bone they have lost with new bone. What increases the risk? You are more likely to develop this condition if: You are older than age 50. You are a woman who went through menopause early. You have a long illness that keeps you in bed. You do not get enough exercise. You lack certain nutrients (malnutrition). You have an overactive thyroid gland (hyperthyroidism). You use products that contain nicotine or tobacco, such as cigarettes, e-cigarettes and chewing tobacco, or you drink a lot of alcohol. You are taking medicines that weaken the bones, such as steroids. What are the signs or symptoms? This condition does not cause any symptoms. You may have a slightly higher risk for bone breaks (fractures), so getting fractures more easily than normal may be an indication of osteopenia. How is this diagnosed? This condition may be diagnosed based on an X-ray exam that measures bone density (dual-energy X-ray  absorptiometry, or DEXA). This test can measure bone density in your hips, spine, and wrists. Osteopenia has no symptoms, so this condition is usually diagnosed after a routine bone density screening test is done for osteoporosis. This routine screening is usually done for: Women who are age 65 or older. Men who are age 70 or older. If you have risk factors for osteopenia, you may have the screening test at an earlier age. How is this treated? Making dietary and lifestyle changes can lower your risk for osteoporosis. If you have severe osteopenia that is close to becoming osteoporosis, this condition can be treated with medicines and dietary supplements such as calcium and vitamin D. These supplements help to rebuild bone density. Follow these instructions at home: Eating and drinking Eat a diet that is high in calcium and vitamin D. Calcium is found in dairy products, beans, salmon, and leafy green vegetables like spinach and broccoli. Look for foods that have vitamin D and calcium added to them (fortified foods), such as orange juice, cereal, and bread.  Lifestyle Do 30 minutes or more of a weight-bearing exercise every day, such as walking, jogging, or playing a sport. These types of exercises strengthen the bones. Do not use any products that contain nicotine or tobacco, such as cigarettes, e-cigarettes, and chewing tobacco. If you need help quitting, ask your health care provider. Do not drink alcohol if: Your health care provider tells you not to drink. You are pregnant, may be pregnant, or are planning to become pregnant. If you drink alcohol: Limit how much you use to: 0-1 drink a day for women. 0-2   drinks a day for men. Be aware of how much alcohol is in your drink. In the [place] one drink equals one 12 oz bottle of beer (355 mL), one 5 oz glass of wine (148 mL), or one 1 oz glass of hard liquor (44 mL). General instructions Take over-the-counter and prescription medicines only as  told by your health care provider. These include vitamins and supplements. Take precautions at home to lower your risk of falling, such as: Keeping rooms well-lit and free of clutter, such as cords. Installing safety rails on stairs. Using rubber mats in the bathroom or other areas that are often wet or slippery. Keep all follow-up visits. This is important. Contact a health care provider if: You have not had a bone density screening for osteoporosis and you are: A woman who is age 65 or older. A man who is age 70 or older. You are a postmenopausal woman who has not had a bone density screening for osteoporosis. You are older than age 50 and you want to know if you should have bone density screening for osteoporosis. Summary Osteopenia is a loss of thickness (density) inside the bones. Another name for osteopenia is low bone mass. Osteopenia is not a disease, but it may increase your risk for a condition that causes the bones to become thin and break more easily (osteoporosis). You may be at risk for osteopenia if you are older than age 50 or if you are a woman who went through early menopause. Osteopenia does not cause any symptoms, but it can be diagnosed with a bone density screening test. Dietary and lifestyle changes are the first treatment for osteopenia. These may lower your risk for osteoporosis. This information is not intended to replace advice given to you by your health care provider. Make sure you discuss any questions you have with your health care provider. Document Revised: 01/08/2020 Document Reviewed: 01/08/2020 Elsevier Patient Education  2023 [employer] Inc.  

## 2022-06-09 ENCOUNTER — Other Ambulatory Visit: Payer: Self-pay | Admitting: Family Medicine

## 2022-06-09 DIAGNOSIS — I10 Essential (primary) hypertension: Secondary | ICD-10-CM

## 2022-06-09 NOTE — Telephone Encounter (Signed)
Requested medication (s) are due for refill today: yes  Requested medication (s) are on the active medication list: yes  Last refill:  12/06/21 #90 1 refills  Future visit scheduled: yes in 6 months  Notes to clinic:  protocol failed last labs 12/06/21 do you want to refill Rx?     Requested Prescriptions  Pending Prescriptions Disp Refills   valsartan-hydrochlorothiazide (DIOVAN-HCT) 80-12.5 MG tablet [Pharmacy Med Name: VALSARTAN-HCTZ 80-12.5 MG TAB] 90 tablet 1    Sig: TAKE 1 TABLET BY MOUTH EVERY DAY     Cardiovascular: ARB + Diuretic Combos Failed - 06/09/2022 12:14 PM      Failed - K in normal range and within 180 days    Potassium  Date Value Ref Range Status  12/06/2021 4.3 3.5 - 5.3 mmol/L Final         Failed - Na in normal range and within 180 days    Sodium  Date Value Ref Range Status  12/06/2021 142 135 - 146 mmol/L Final  04/19/2015 140 134 - 144 mmol/L Final         Failed - Cr in normal range and within 180 days    Creat  Date Value Ref Range Status  12/06/2021 0.72 0.60 - 1.00 mg/dL Final         Failed - eGFR is 10 or above and within 180 days    GFR, Est African American  Date Value Ref Range Status  12/08/2020 83 > OR = 60 mL/min/1.71m Final   GFR, Est Non African American  Date Value Ref Range Status  12/08/2020 72 > OR = 60 mL/min/1.758mFinal   eGFR  Date Value Ref Range Status  12/06/2021 87 > OR = 60 mL/min/1.731minal    Comment:    The eGFR is based on the CKD-EPI 2021 equation. To calculate  the new eGFR from a previous Creatinine or Cystatin C result, go to https://www.kidney.org/professionals/ kdoqi/gfr%5Fcalculator          Passed - Patient is not pregnant      Passed - Last BP in normal range    BP Readings from Last 1 Encounters:  06/08/22 118/72         Passed - Valid encounter within last 6 months    Recent [facility] Visits           [facility]   [facility]   [facility]   1 year ago [facility]olesterolemia   [facility]   2 years ago Essential hypertension   [facility]       Future Appointments             In 6 months SowAncil BoozerriDrue StagerD [facility] KowRalene BatheD [facility]

## 2022-06-12 ENCOUNTER — Encounter: Payer: Self-pay | Admitting: Family Medicine

## 2022-06-12 ENCOUNTER — Other Ambulatory Visit: Payer: Self-pay

## 2022-06-13 ENCOUNTER — Other Ambulatory Visit: Payer: Self-pay | Admitting: Family Medicine

## 2022-06-13 DIAGNOSIS — I10 Essential (primary) hypertension: Secondary | ICD-10-CM

## 2022-06-13 MED ORDER — VALSARTAN-HYDROCHLOROTHIAZIDE 80-12.5 MG PO TABS: 1.0000 | ORAL_TABLET | Freq: Every day | ORAL | 1 refills | Status: DC

## 2022-12-06 ENCOUNTER — Other Ambulatory Visit: Payer: Self-pay | Admitting: Family Medicine

## 2022-12-06 DIAGNOSIS — I10 Essential (primary) hypertension: Secondary | ICD-10-CM

## 2022-12-06 NOTE — Progress Notes (Unsigned)
Name: Hannah Lindsey   MRN: [id number]    DOB: 01/04/1946   Date:12/07/2022       Progress Note  Subjective  Chief Complaint  Follow Up  HPI  HTN: she is down on norvasc 5 mg and low dose valsartan hctz 80/12.5 mg and bp is at goal today. Denies chest pain or palpitation  Headache: still present, top of the head, intermittent, nortriptyline did not really help and caused constipation, so she stopped medication.  Mild and throbbing like pain radiates from left  parietal area to her nose but now is more like a dull ache and no longer constant  She states mother and two siblings with Parkinson's disease. Marland Kitchen  She was seen by neurologist , Dr. Malvin Johns, she was given Effexor two 75 mg daily , she states symptoms improved temporarily but had to keep increasing dose, she had a problem filling rx and had severe withdrawal so she decided to stop taking. She had multiple MRI's and normal . She is not interested in taking anything well. 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 She takes Tylenol occasionally    Hyperlipidemia: she is taking Crestor and last LDL at goal, no side effects of medications We will recheck las    Osteopenia:high FRAX score is above goal now, reviewed last labs, no secondary causes, we will check TSH on her next labs. She is now on Alendronate and tolerating it well   Perennial allergic rhinitis: she has noticed increase in eye irritation, itching, stings and  sometimes wakes up with eyes crusty. Seen by ophthalmologist and advised eye drops. Advised her to resume flonase and continue zyrtec     Senile Purpura: doing better now, but still bruises on  arms, unchanged   Seborrhea capitis, using otc selsum blue and stable   Tinnitus bilateral: mild and chronic - about 10 years - very mild hearing loss - states not bad enough to see ENT, reassurance given   Left shoulder pain: she states that many years ago she had a left shoulder dislocation and is now noticing pain when laying on left side and also with abduction and rotation of left shoulder, discussed PT but she states she prefers trying home exercises first   Patient Active Problem List   Diagnosis Date Noted   Senile purpura (HCC) 12/08/2020   Tinnitus of both ears 12/07/2020   Anxiety, generalized 10/20/2020   Chronic daily headache 08/22/2020   Perennial allergic rhinitis with seasonal variation 11/09/2017   Osteopenia after menopause 11/09/2017   Post-menopausal 09/06/2016   Hyperlipidemia 06/19/2016   Essential hypertension 06/04/2015    Past Surgical History:  Procedure Laterality Date   COLONOSCOPY WITH PROPOFOL N/A 08/19/2018   Procedure: COLONOSCOPY WITH PROPOFOL;  Surgeon: Scot Jun, MD;  Location: [facility] ENDOSCOPY;  Service: Endoscopy;  Laterality: N/A;   TONSILLECTOMY      Family History  Problem Relation Age of Onset   Fibromyalgia Mother    Heart Problems Mother    Dementia Father    Heart Problems Father    Heart disease Father    Breast cancer Maternal Grandmother    Cancer  Maternal Grandmother    Cancer Maternal Aunt    Breast cancer Maternal Aunt     Social History   Tobacco Use   Smoking status: Never   Smokeless tobacco: Never  Substance Use Topics   Alcohol use: Yes    Alcohol/week: 1.0 standard drink of alcohol    Types: 1 Glasses of wine per week    Comment: nightly     Current Outpatient Medications:    alendronate (FOSAMAX) 35 MG tablet, Take 1 tablet (35 mg total) by mouth every 7 (seven) days. Take with a full glass of water on an empty stomach., Disp: 12 tablet, Rfl: 3   amLODipine (NORVASC) 5 MG tablet, Take 1 tablet (5 mg  total) by mouth daily., Disp: 90 tablet, Rfl: 0   cetirizine (ZYRTEC) 10 MG tablet, Take 10 mg by mouth daily., Disp: , Rfl:    fluticasone (FLONASE) 50 MCG/ACT nasal spray, SPRAY 2 SPRAYS INTO EACH NOSTRIL EVERY DAY, Disp: 16 g, Rfl: 1   rosuvastatin (CRESTOR) 20 MG tablet, Take 1 tablet (20 mg total) by mouth daily., Disp: 90 tablet, Rfl: 1   valsartan-hydrochlorothiazide (DIOVAN-HCT) 80-12.5 MG tablet, Take 1 tablet by mouth daily., Disp: 90 tablet, Rfl: 1   baclofen (LIORESAL) 10 MG tablet, TAKE 1 TABLET BY MOUTH DAILY AS NEEDED FOR MUSCLE SPASMS. (Patient not taking: Reported on 12/07/2022), Disp: 90 tablet, Rfl: 0   diclofenac Sodium (VOLTAREN) 1 % GEL, Apply 2 g topically 4 (four) times daily. (Patient not taking: Reported on 12/07/2022), Disp: 100 g, Rfl: 1   loratadine (CLARITIN) 10 MG tablet, Take 10 mg by mouth daily. (Patient not taking: Reported on 12/07/2022), Disp: , Rfl:   No Known Allergies  I personally reviewed active problem list, medication list, allergies, family history, social history, health maintenance with the patient/caregiver today.   ROS  Constitutional: Negative for fever or weight change.  Respiratory: Negative for cough and shortness of breath.   Cardiovascular: Negative for chest pain or palpitations.  Gastrointestinal: Negative for abdominal pain, no bowel changes.  Musculoskeletal: Negative for gait problem or joint swelling.  Skin: Negative for rash.  Neurological: Negative for dizziness or headache.  No other specific complaints in a complete review of systems (except as listed in HPI above).   Objective  Vitals:   12/07/22 0809  BP: 126/70  Pulse: 83  Resp: 16  SpO2: 99%  Weight: 173 lb (78.5 kg)  Height: 5\' 5"  (1.651 m)    Body mass index is 28.79 kg/m.  Physical Exam  Constitutional: Patient appears well-developed and well-nourished.  No distress.  HEENT: head atraumatic, normocephalic, pupils equal and reactive to light, ears normal TM,  neck supple, throat within normal limits Cardiovascular: Normal rate, regular rhythm and normal heart sounds.  No murmur heard. No BLE edema. Pulmonary/Chest: Effort normal and breath sounds normal. No respiratory distress. Abdominal: Soft.  There is no tenderness. Muscular skeletal: pain with abduction of left shoulder  Psychiatric: Patient has a normal mood and affect. behavior is normal. Judgment and thought content normal.    PHQ2/9:    12/07/2022    8:09 AM 06/08/2022    8:10 AM 12/27/2021    3:35 PM 12/06/2021    8:48 AM 06/10/2021    8:32 AM  Depression screen PHQ 2/9  Decreased Interest 0 0 0 0 0  Down, Depressed, Hopeless 0 0 0 0 0  PHQ - 2 Score 0 0 0 0 0  Altered sleeping 0 0   0  Tired, decreased energy 0 0   0  Change in appetite 0 0   0  Feeling bad or failure about yourself  0 0   0  Trouble concentrating 0 0   0  Moving slowly or fidgety/restless 0 0   0  Suicidal thoughts 0 0   0  PHQ-9 Score 0 0   0  Difficult doing work/chores  Not difficult at all       phq 9 is negative   Fall Risk:    12/07/2022    8:09 AM 06/08/2022    8:10 AM 12/27/2021    3:36 PM 12/06/2021    8:48 AM 06/10/2021    8:32 AM  Fall Risk   Falls in the past year? 0 0 0 0 1  Number falls in past yr: 0 0 0  0  Injury with Fall? 0 0 0  1  Risk for fall due to : No Fall Risks  No Fall Risks No Fall Risks No Fall Risks  Follow up Falls prevention discussed  Falls prevention discussed Falls prevention discussed Falls prevention discussed      Functional Status Survey: Is the patient deaf or have difficulty hearing?: No Does the patient have difficulty seeing, even when wearing glasses/contacts?: No Does the patient have difficulty concentrating, remembering, or making decisions?: No Does the patient have difficulty walking or climbing stairs?: No Does the patient have difficulty dressing or bathing?: No Does the patient have difficulty doing errands alone such as visiting a doctor's office or  shopping?: No    Assessment & Plan  1. Senile purpura (HCC)  Reassurance given   2. Pure hypercholesterolemia  - Lipid panel - rosuvastatin (CRESTOR) 20 MG tablet; Take 1 tablet (20 mg total) by mouth daily.  Dispense: 90 tablet; Refill: 1  3. Essential hypertension  - COMPLETE METABOLIC PANEL WITH GFR - CBC with Differential/Platelet - valsartan-hydrochlorothiazide (DIOVAN-HCT) 80-12.5 MG tablet; Take 1 tablet by mouth daily.  Dispense: 90 tablet; Refill: 1 - amLODipine (NORVASC) 5 MG tablet; Take 1 tablet (5 mg total) by mouth daily.  Dispense: 90 tablet; Refill: 1  4. Osteopenia after menopause  On Alendronate   5. Vitamin D deficiency  - VITAMIN D 25 Hydroxy (Vit-D Deficiency, Fractures)  6. Seborrhea capitis  Stable  7. Other insomnia  Usually secondary to neck pain but she would like to have something to take prn   8. Perennial allergic rhinitis with seasonal variation  - fluticasone (FLONASE) 50 MCG/ACT nasal spray; Place 2 sprays into both nostrils daily.  Dispense: 48 g; Refill: 1

## 2022-12-07 ENCOUNTER — Ambulatory Visit (INDEPENDENT_AMBULATORY_CARE_PROVIDER_SITE_OTHER): Payer: Medicare Other | Admitting: Family Medicine

## 2022-12-07 ENCOUNTER — Encounter: Payer: Self-pay | Admitting: Family Medicine

## 2022-12-07 VITALS — BP 126/70 | HR 83 | Resp 16 | Ht 65.0 in | Wt 173.0 lb

## 2022-12-07 DIAGNOSIS — J302 Other seasonal allergic rhinitis: Secondary | ICD-10-CM | POA: Diagnosis not present

## 2022-12-07 DIAGNOSIS — I10 Essential (primary) hypertension: Secondary | ICD-10-CM | POA: Diagnosis not present

## 2022-12-07 DIAGNOSIS — M8580 Other specified disorders of bone density and structure, unspecified site: Secondary | ICD-10-CM | POA: Diagnosis not present

## 2022-12-07 DIAGNOSIS — E7800 Pure hypercholesterolemia, unspecified: Secondary | ICD-10-CM | POA: Diagnosis not present

## 2022-12-07 DIAGNOSIS — J3089 Other allergic rhinitis: Secondary | ICD-10-CM

## 2022-12-07 DIAGNOSIS — L210 Seborrhea capitis: Secondary | ICD-10-CM

## 2022-12-07 DIAGNOSIS — Z780 Asymptomatic menopausal state: Secondary | ICD-10-CM | POA: Diagnosis not present

## 2022-12-07 DIAGNOSIS — G4709 Other insomnia: Secondary | ICD-10-CM | POA: Diagnosis not present

## 2022-12-07 DIAGNOSIS — E559 Vitamin D deficiency, unspecified: Secondary | ICD-10-CM | POA: Diagnosis not present

## 2022-12-07 DIAGNOSIS — D692 Other nonthrombocytopenic purpura: Secondary | ICD-10-CM

## 2022-12-07 MED ORDER — VALSARTAN-HYDROCHLOROTHIAZIDE 80-12.5 MG PO TABS: 1.0000 | ORAL_TABLET | Freq: Every day | ORAL | 1 refills | Status: DC

## 2022-12-07 MED ORDER — QUETIAPINE FUMARATE 25 MG PO TABS: 25.0000 mg | ORAL_TABLET | Freq: Every day | ORAL | 0 refills | Status: DC

## 2022-12-07 MED ORDER — FLUTICASONE PROPIONATE 50 MCG/ACT NA SUSP: 2.0000 | Freq: Every day | NASAL | 1 refills | Status: DC

## 2022-12-07 MED ORDER — AMLODIPINE BESYLATE 5 MG PO TABS: 5.0000 mg | ORAL_TABLET | Freq: Every day | ORAL | 1 refills | Status: DC

## 2022-12-07 MED ORDER — ROSUVASTATIN CALCIUM 20 MG PO TABS: 20.0000 mg | ORAL_TABLET | Freq: Every day | ORAL | 1 refills | Status: DC

## 2022-12-07 NOTE — Patient Instructions (Signed)
Rotator Cuff Tear Rehab After Surgery, Adult You will learn about taking care of yourself after surgery. To view the content, go to this web address: [email or web address]  This video will expire on: 07/19/2024. If you need access to this video following this date, please reach out to the healthcare provider who assigned it to you. This information is not intended to replace advice given to you by your health care provider. Make sure you discuss any questions you have with your health care provider. Elsevier Patient Education  2023 [employer].

## 2022-12-08 LAB — CBC WITH DIFFERENTIAL/PLATELET
Absolute Monocytes: 410 cells/uL (ref 200–950)
Basophils Absolute: 32 cells/uL (ref 0–200)
Basophils Relative: 0.5 %
Eosinophils Absolute: 50 cells/uL (ref 15–500)
Eosinophils Relative: 0.8 %
HCT: 44.2 % (ref 35.0–45.0)
Hemoglobin: 14.7 g/dL (ref 11.7–15.5)
Lymphs Abs: 1644 cells/uL (ref 850–3900)
MCH: 28.3 pg (ref 27.0–33.0)
MCHC: 33.3 g/dL (ref 32.0–36.0)
MCV: 85 fL (ref 80.0–100.0)
MPV: 8.8 fL (ref 7.5–12.5)
Monocytes Relative: 6.5 %
Neutro Abs: 4164 cells/uL (ref 1500–7800)
Neutrophils Relative %: 66.1 %
Platelets: 184 10*3/uL (ref 140–400)
RBC: 5.2 10*6/uL — ABNORMAL HIGH (ref 3.80–5.10)
RDW: 12.6 % (ref 11.0–15.0)
Total Lymphocyte: 26.1 %
WBC: 6.3 10*3/uL (ref 3.8–10.8)

## 2022-12-08 LAB — COMPLETE METABOLIC PANEL WITH GFR
AG Ratio: 1.8 (calc) (ref 1.0–2.5)
ALT: 22 U/L (ref 6–29)
AST: 22 U/L (ref 10–35)
Albumin: 4.6 g/dL (ref 3.6–5.1)
Alkaline phosphatase (APISO): 72 U/L (ref 37–153)
BUN: 17 mg/dL (ref 7–25)
CO2: 30 mmol/L (ref 20–32)
Calcium: 10 mg/dL (ref 8.6–10.4)
Chloride: 101 mmol/L (ref 98–110)
Creat: 0.84 mg/dL (ref 0.60–1.00)
Globulin: 2.6 g/dL (calc) (ref 1.9–3.7)
Glucose, Bld: 94 mg/dL (ref 65–99)
Potassium: 4.4 mmol/L (ref 3.5–5.3)
Sodium: 139 mmol/L (ref 135–146)
Total Bilirubin: 0.4 mg/dL (ref 0.2–1.2)
Total Protein: 7.2 g/dL (ref 6.1–8.1)
eGFR: 72 mL/min/{1.73_m2} (ref >=60)

## 2022-12-08 LAB — LIPID PANEL
Cholesterol: 153 mg/dL (ref <200)
HDL: 51 mg/dL (ref >=50)
LDL Cholesterol (Calc): 78 mg/dL (calc)
Non-HDL Cholesterol (Calc): 102 mg/dL (calc) (ref <130)
Total CHOL/HDL Ratio: 3 (calc) (ref <5.0)
Triglycerides: 137 mg/dL (ref <150)

## 2022-12-08 LAB — VITAMIN D 25 HYDROXY (VIT D DEFICIENCY, FRACTURES): Vit D, 25-Hydroxy: 40 ng/mL (ref 30–100)

## 2022-12-29 ENCOUNTER — Other Ambulatory Visit: Payer: Self-pay | Admitting: Family Medicine

## 2022-12-29 DIAGNOSIS — G4709 Other insomnia: Secondary | ICD-10-CM

## 2023-01-18 ENCOUNTER — Ambulatory Visit (INDEPENDENT_AMBULATORY_CARE_PROVIDER_SITE_OTHER): Payer: Medicare Other

## 2023-01-18 VITALS — Ht 65.0 in | Wt 173.0 lb

## 2023-01-18 DIAGNOSIS — Z0000 Encounter for general adult medical examination without abnormal findings: Secondary | ICD-10-CM | POA: Diagnosis not present

## 2023-01-18 NOTE — Patient Instructions (Signed)
Hannah Lindsey , Thank you for taking time to come for your Medicare Wellness Visit. I appreciate your ongoing commitment to your health goals. Please review the following plan we discussed and let me know if I can assist you in the future.   These are the goals we discussed:  Goals      DIET - INCREASE WATER INTAKE     Recommend to drink at least 6-8 8oz glasses of water per day.        This is a list of the screening recommended for you and due dates:  Health Maintenance  Topic Date Due   COVID-19 Vaccine (7 - 2023-24 season) 01/18/2023   Flu Shot  03/08/2023   Mammogram  06/02/2023   Colon Cancer Screening  08/21/2023   Medicare Annual Wellness Visit  01/18/2024   DTaP/Tdap/Td vaccine (3 - Td or Tdap) 06/08/2032   Pneumonia Vaccine  Completed   DEXA scan (bone density measurement)  Completed   Hepatitis C Screening  Completed   Zoster (Shingles) Vaccine  Completed   HPV Vaccine  Aged Out    Advanced directives: yes  Conditions/risks identified: low falls risk  Next appointment: Follow up in one year for your annual wellness visit 01/24/2024 @10 :15am telephone   Preventive Care 65 Years and Older, Female Preventive care refers to lifestyle choices and visits with your health care provider that can promote health and wellness. What does preventive care include? A yearly physical exam. This is also called an annual well check. Dental exams once or twice a year. Routine eye exams. Ask your health care provider how often you should have your eyes checked. Personal lifestyle choices, including: Daily care of your teeth and gums. Regular physical activity. Eating a healthy diet. Avoiding tobacco and drug use. Limiting alcohol use. Practicing safe sex. Taking low-dose aspirin every day. Taking vitamin and mineral supplements as recommended by your health care provider. What happens during an annual well check? The services and screenings done by your health care provider  during your annual well check will depend on your age, overall health, lifestyle risk factors, and family history of disease. Counseling  Your health care provider may ask you questions about your: Alcohol use. Tobacco use. Drug use. Emotional well-being. Home and relationship well-being. Sexual activity. Eating habits. History of falls. Memory and ability to understand (cognition). Work and work Astronomer. Reproductive health. Screening  You may have the following tests or measurements: Height, weight, and BMI. Blood pressure. Lipid and cholesterol levels. These may be checked every 5 years, or more frequently if you are over 44 years old. Skin check. Lung cancer screening. You may have this screening every year starting at age 78 if you have a 30-pack-year history of smoking and currently smoke or have quit within the past 15 years. Fecal occult blood test (FOBT) of the stool. You may have this test every year starting at age 30. Flexible sigmoidoscopy or colonoscopy. You may have a sigmoidoscopy every 5 years or a colonoscopy every 10 years starting at age 57. Hepatitis C blood test. Hepatitis B blood test. Sexually transmitted disease (STD) testing. Diabetes screening. This is done by checking your blood sugar (glucose) after you have not eaten for a while (fasting). You may have this done every 1-3 years. Bone density scan. This is done to screen for osteoporosis. You may have this done starting at age 73. Mammogram. This may be done every 1-2 years. Talk to your health care provider about how often  you should have regular mammograms. Talk with your health care provider about your test results, treatment options, and if necessary, the need for more tests. Vaccines  Your health care provider may recommend certain vaccines, such as: Influenza vaccine. This is recommended every year. Tetanus, diphtheria, and acellular pertussis (Tdap, Td) vaccine. You may need a Td booster every  10 years. Zoster vaccine. You may need this after age 40. Pneumococcal 13-valent conjugate (PCV13) vaccine. One dose is recommended after age 50. Pneumococcal polysaccharide (PPSV23) vaccine. One dose is recommended after age 62. Talk to your health care provider about which screenings and vaccines you need and how often you need them. This information is not intended to replace advice given to you by your health care provider. Make sure you discuss any questions you have with your health care provider. Document Released: 08/20/2015 Document Revised: 04/12/2016 Document Reviewed: 05/25/2015 Elsevier Interactive Patient Education  2017 [employer].  Fall Prevention in the Home Falls can cause injuries. They can happen to people of all ages. There are many things you can do to make your home safe and to help prevent falls. What can I do on the outside of my home? Regularly fix the edges of walkways and driveways and fix any cracks. Remove anything that might make you trip as you walk through a door, such as a raised step or threshold. Trim any bushes or trees on the path to your home. Use bright outdoor lighting. Clear any walking paths of anything that might make someone trip, such as rocks or tools. Regularly check to see if handrails are loose or broken. Make sure that both sides of any steps have handrails. Any raised decks and porches should have guardrails on the edges. Have any leaves, snow, or ice cleared regularly. Use sand or salt on walking paths during winter. Clean up any spills in your garage right away. This includes oil or grease spills. What can I do in the bathroom? Use night lights. Install grab bars by the toilet and in the tub and shower. Do not use towel bars as grab bars. Use non-skid mats or decals in the tub or shower. If you need to sit down in the shower, use a plastic, non-slip stool. Keep the floor dry. Clean up any water that spills on the floor as soon as it  happens. Remove soap buildup in the tub or shower regularly. Attach bath mats securely with double-sided non-slip rug tape. Do not have throw rugs and other things on the floor that can make you trip. What can I do in the bedroom? Use night lights. Make sure that you have a light by your bed that is easy to reach. Do not use any sheets or blankets that are too big for your bed. They should not hang down onto the floor. Have a firm chair that has side arms. You can use this for support while you get dressed. Do not have throw rugs and other things on the floor that can make you trip. What can I do in the kitchen? Clean up any spills right away. Avoid walking on wet floors. Keep items that you use a lot in easy-to-reach places. If you need to reach something above you, use a strong step stool that has a grab bar. Keep electrical cords out of the way. Do not use floor polish or wax that makes floors slippery. If you must use wax, use non-skid floor wax. Do not have throw rugs and other things on  the floor that can make you trip. What can I do with my stairs? Do not leave any items on the stairs. Make sure that there are handrails on both sides of the stairs and use them. Fix handrails that are broken or loose. Make sure that handrails are as long as the stairways. Check any carpeting to make sure that it is firmly attached to the stairs. Fix any carpet that is loose or worn. Avoid having throw rugs at the top or bottom of the stairs. If you do have throw rugs, attach them to the floor with carpet tape. Make sure that you have a light switch at the top of the stairs and the bottom of the stairs. If you do not have them, ask someone to add them for you. What else can I do to help prevent falls? Wear shoes that: Do not have high heels. Have rubber bottoms. Are comfortable and fit you well. Are closed at the toe. Do not wear sandals. If you use a stepladder: Make sure that it is fully opened.  Do not climb a closed stepladder. Make sure that both sides of the stepladder are locked into place. Ask someone to hold it for you, if possible. Clearly mark and make sure that you can see: Any grab bars or handrails. First and last steps. Where the edge of each step is. Use tools that help you move around (mobility aids) if they are needed. These include: Canes. Walkers. Scooters. Crutches. Turn on the lights when you go into a dark area. Replace any light bulbs as soon as they burn out. Set up your furniture so you have a clear path. Avoid moving your furniture around. If any of your floors are uneven, fix them. If there are any pets around you, be aware of where they are. Review your medicines with your doctor. Some medicines can make you feel dizzy. This can increase your chance of falling. Ask your doctor what other things that you can do to help prevent falls. This information is not intended to replace advice given to you by your health care provider. Make sure you discuss any questions you have with your health care provider. Document Released: 05/20/2009 Document Revised: 12/30/2015 Document Reviewed: 08/28/2014 Elsevier Interactive Patient Education  2017 [employer].

## 2023-01-18 NOTE — Progress Notes (Signed)
I connected with  Hannah Lindsey on 01/18/23 by a audio enabled telemedicine application and verified that I am speaking with the correct person using two identifiers.  Patient Location: Home  Provider Location: Office/Clinic  I discussed the limitations of evaluation and management by telemedicine. The patient expressed understanding and agreed to proceed.  Subjective:   Hannah Lindsey is a 77 y.o. female who presents for Medicare Annual (Subsequent) preventive examination.  Review of Systems     Cardiac Risk Factors include: advanced age (>33men, >52 women);dyslipidemia;hypertension     Objective:    Today's Vitals   01/18/23 1019  Weight: 173 lb (78.5 kg)  Height: 5\' 5"  (1.651 m)   Body mass index is 28.79 kg/m.     01/18/2023   10:26 AM 12/27/2021    3:35 PM 12/23/2020   11:29 AM 11/30/2020    9:25 AM 03/15/2020   10:07 AM 11/18/2019    9:05 AM 08/19/2018    9:25 AM  Advanced Directives  Does Patient Have a Medical Advance Directive? Yes Yes Yes No Yes Yes No  Type of Estate agent of [place];Living will Healthcare Power of [place];Living will Healthcare Power of [place];Living will  Healthcare Power of [place];Living will Healthcare Power of [place];Living will   Copy of Healthcare Power of Attorney in Chart?  No - copy requested No - copy requested   No - copy requested     Current Medications (verified) Outpatient Encounter Medications as of 01/18/2023  Medication Sig   alendronate (FOSAMAX) 35 MG tablet Take 1 tablet (35 mg total) by mouth every 7 (seven) days. Take with a full glass of water on an empty stomach.   amLODipine (NORVASC) 5 MG tablet Take 1 tablet (5 mg total) by mouth daily.   cetirizine (ZYRTEC) 10 MG tablet Take 10 mg by mouth daily.   fluticasone (FLONASE) 50 MCG/ACT nasal spray Place 2 sprays into both nostrils daily.   QUEtiapine (SEROQUEL) 25 MG tablet TAKE 1 TABLET (25 MG TOTAL) BY MOUTH AT BEDTIME. FOR SLEEP    rosuvastatin (CRESTOR) 20 MG tablet Take 1 tablet (20 mg total) by mouth daily.   valsartan-hydrochlorothiazide (DIOVAN-HCT) 80-12.5 MG tablet Take 1 tablet by mouth daily.   No facility-administered encounter medications on file as of 01/18/2023.    Allergies (verified) Patient has no known allergies.   History: Past Medical History:  Diagnosis Date   Allergy    Anxiety, generalized    Hyperglycemia    Hyperlipidemia    Hypertension    Past Surgical History:  Procedure Laterality Date   COLONOSCOPY WITH PROPOFOL N/A 08/19/2018   Procedure: COLONOSCOPY WITH PROPOFOL;  Surgeon: Scot Jun, MD;  Location: [facility] ENDOSCOPY;  Service: Endoscopy;  Laterality: N/A;   TONSILLECTOMY     Family History  Problem Relation Age of Onset   Fibromyalgia Mother    Heart Problems Mother    Dementia Father    Heart Problems Father    Heart disease Father    Breast cancer Maternal Grandmother    Cancer Maternal Grandmother    Cancer Maternal Aunt    Breast cancer Maternal Aunt    Social History   Socioeconomic History   Marital status: Married    Spouse name: John   Number of children: 1   Years of education: Not on file   Highest education level: Bachelor's degree (e.g., BA, AB, BS)  Occupational History   Not on file  Tobacco Use   Smoking status: Never  Smokeless tobacco: Never  Vaping Use   Vaping Use: Never used  Substance and Sexual Activity   Alcohol use: Yes    Alcohol/week: 1.0 standard drink of alcohol    Types: 1 Glasses of wine per week    Comment: nightly   Drug use: No   Sexual activity: Not Currently    Partners: Male    Birth control/protection: Post-menopausal  Other Topics Concern   Not on file  Social History Narrative   Not on file   Social Determinants of Health   Financial Resource Strain: Low Risk  (01/14/2023)   Overall Financial Resource Strain (CARDIA)    Difficulty of Paying Living Expenses: Not hard at all  Food Insecurity: No Food  Insecurity (01/14/2023)   Hunger Vital Sign    Worried About Running Out of Food in the Last Year: Never true    Ran Out of Food in the Last Year: Never true  Transportation Needs: No Transportation Needs (01/14/2023)   PRAPARE - Administrator, Civil Service (Medical): No    Lack of Transportation (Non-Medical): No  Physical Activity: Insufficiently Active (01/14/2023)   Exercise Vital Sign    Days of Exercise per Week: 7 days    Minutes of Exercise per Session: 20 min  Stress: No Stress Concern Present (01/14/2023)   [employer] of Occupational Health - Occupational Stress Questionnaire    Feeling of Stress : Not at all  Social Connections: Moderately Integrated (01/14/2023)   Social Connection and Isolation Panel [NHANES]    Frequency of Communication with Friends and Family: Twice a week    Frequency of Social Gatherings with Friends and Family: Once a week    Attends Religious Services: Never    Database administrator or Organizations: Yes    Attends Engineer, structural: More than 4 times per year    Marital Status: Married    Tobacco Counseling Counseling given: Not Answered   Clinical Intake:  Pre-visit preparation completed: Yes  Pain : No/denies pain     BMI - recorded: 28.79 Nutritional Status: BMI 25 -29 Overweight Nutritional Risks: None Diabetes: No  How often do you need to have someone help you when you read instructions, pamphlets, or other written materials from your doctor or pharmacy?: 1 - Never  Diabetic?no  Interpreter Needed?: No  Comments: lives with husband Information entered by :: B.Shuntavia Yerby,LPN   Activities of Daily Living    01/14/2023   10:44 AM 12/07/2022    8:09 AM  In your present state of health, do you have any difficulty performing the following activities:  Hearing? 0 0  Vision? 0 0  Difficulty concentrating or making decisions? 0 0  Walking or climbing stairs? 0 0  Dressing or bathing? 0 0  Doing errands,  shopping? 0 0  Preparing Food and eating ? N   Using the Toilet? N   In the past six months, have you accidently leaked urine? Y   Do you have problems with loss of bowel control? N   Managing your Medications? N   Managing your Finances? N   Housekeeping or managing your Housekeeping? N     Patient Care Team: Alba Cory, MD as PCP - General (Family Medicine) Morene Crocker, MD as Referring Physician (Neurology)  Indicate any recent Medical Services you may have received from other than [facility] providers in the past year (date may be approximate).     Assessment:   This is a routine  wellness examination for Zainab.  Hearing/Vision screen Hearing Screening - Comments:: Adequate hearing Vision Screening - Comments:: Adequate vision w/glasses Dr Clearance Coots  Dietary issues and exercise activities discussed: Current Exercise Habits: Home exercise routine, Type of exercise: walking, Time (Minutes): 20, Frequency (Times/Week): 7, Weekly Exercise (Minutes/Week): 140, Intensity: Mild, Exercise limited by: None identified   Goals Addressed             This Visit's Progress    DIET - INCREASE WATER INTAKE   On track    Recommend to drink at least 6-8 8oz glasses of water per day.       Depression Screen    01/18/2023   10:24 AM 12/07/2022    8:09 AM 06/08/2022    8:10 AM 12/27/2021    3:35 PM 12/06/2021    8:48 AM 06/10/2021    8:32 AM 12/23/2020   11:28 AM  PHQ 2/9 Scores  PHQ - 2 Score 0 0 0 0 0 0 0  PHQ- 9 Score  0 0   0     Fall Risk    01/14/2023   10:44 AM 12/07/2022    8:09 AM 06/08/2022    8:10 AM 12/27/2021    3:36 PM 12/06/2021    8:48 AM  Fall Risk   Falls in the past year? 0 0 0 0 0  Number falls in past yr:  0 0 0   Injury with Fall?  0 0 0   Risk for fall due to : No Fall Risks No Fall Risks  No Fall Risks No Fall Risks  Follow up Education provided;Falls prevention discussed Falls prevention discussed  Falls prevention discussed Falls prevention discussed     FALL RISK PREVENTION PERTAINING TO THE HOME:  Any stairs in or around the home? Yes  If so, are there any without handrails? Yes  Home free of loose throw rugs in walkways, pet beds, electrical cords, etc? Yes  Adequate lighting in your home to reduce risk of falls? Yes   ASSISTIVE DEVICES UTILIZED TO PREVENT FALLS:  Life alert?  Use of a cane, walker or w/c?  Grab bars in the bathroom?  Shower chair or bench in shower? no Elevated toilet seat or a handicapped toilet? No    Cognitive Function:        01/18/2023   10:30 AM 11/18/2019    9:08 AM 11/09/2017   11:07 AM 05/29/2016    9:06 AM  6CIT Screen  What Year? 0 points 0 points 0 points 0 points  What month? 0 points 0 points 0 points 0 points  What time? 0 points 0 points 0 points 0 points  Count back from 20 0 points 0 points 0 points 0 points  Months in reverse 0 points 0 points 0 points 0 points  Repeat phrase 0 points 0 points 2 points 2 points  Total Score 0 points 0 points 2 points 2 points    Immunizations Immunization History  Administered Date(s) Administered   Covid-19, Mrna,Vaccine(Spikevax)56yrs and older 11/23/2022   Fluad Quad(high Dose 65+) 05/14/2019, 06/08/2020, 06/10/2021, 06/08/2022   Influenza, High Dose Seasonal PF 04/19/2015, 05/29/2016, 05/09/2017, 05/10/2018   Influenza-Unspecified 04/07/2014   Moderna Covid-19 Vaccine Bivalent Booster 47yrs & up 06/02/2021, 05/16/2022   PFIZER(Purple Top)SARS-COV-2 Vaccination 09/16/2019, 10/07/2019, 05/06/2020   Pneumococcal Conjugate-13 10/08/2013   Pneumococcal Polysaccharide-23 02/29/2012   Respiratory Syncytial Virus Vaccine,Recomb Aduvanted(Arexvy) 04/03/2022   Tdap 02/29/2012, 06/08/2022   Zoster Recombinat (Shingrix) 10/07/2021, 12/09/2021   Zoster, Live 04/14/2013  TDAP status: Up to date  Flu Vaccine status: Up to date  Pneumococcal vaccine status: Up to date  Covid-19 vaccine status: Completed vaccines  Qualifies for Shingles  Vaccine? Yes   Zostavax completed Yes   Shingrix Completed?: Yes  Screening Tests Health Maintenance  Topic Date Due   COVID-19 Vaccine (7 - 2023-24 season) 01/18/2023   INFLUENZA VACCINE  03/08/2023   MAMMOGRAM  06/02/2023   Colonoscopy  08/21/2023   Medicare Annual Wellness (AWV)  01/18/2024   DTaP/Tdap/Td (3 - Td or Tdap) 06/08/2032   Pneumonia Vaccine 71+ Years old  Completed   DEXA SCAN  Completed   Hepatitis C Screening  Completed   Zoster Vaccines- Shingrix  Completed   HPV VACCINES  Aged Out    Health Maintenance  Health Maintenance Due  Topic Date Due   COVID-19 Vaccine (7 - 2023-24 season) 01/18/2023    Colorectal cancer screening: Type of screening: Colonoscopy. Completed yes. Repeat every 5 years  Mammogram status: No longer required due to age.  Bone Density status: Completed yes. Results reflect: Bone density results: OSTEOPENIA. Repeat every 3 years.  Lung Cancer Screening: (Low Dose CT Chest recommended if Age 69-80 years, 30 pack-year currently smoking OR have quit w/in 15years.) does not qualify.   Lung Cancer Screening Referral: no  Additional Screening:  Hepatitis C Screening: does not qualify; Completed yes  Vision Screening: Recommended annual ophthalmology exams for early detection of glaucoma and other disorders of the eye. Is the patient up to date with their annual eye exam?  Yes  Who is the provider or what is the name of the office in which the patient attends annual eye exams? Dr Clearance Coots If pt is not established with a provider, would they like to be referred to a provider to establish care? No .   Dental Screening: Recommended annual dental exams for proper oral hygiene  Community Resource Referral / Chronic Care Management: CRR required this visit?  No   CCM required this visit?  No      Plan:     I have personally reviewed and noted the following in the patient's chart:   Medical and social history Use of alcohol, tobacco or  illicit drugs  Current medications and supplements including opioid prescriptions. Patient is not currently taking opioid prescriptions. Functional ability and status Nutritional status Physical activity Advanced directives List of other physicians Hospitalizations, surgeries, and ER visits in previous 12 months Vitals Screenings to include cognitive, depression, and falls Referrals and appointments  In addition, I have reviewed and discussed with patient certain preventive protocols, quality metrics, and best practice recommendations. A written personalized care plan for preventive services as well as general preventive health recommendations were provided to patient.     Sue Lush, LPN   1/61/0960   Nurse Notes: The patient states she is doing well and has no concerns or questions at this time.

## 2023-03-26 ENCOUNTER — Other Ambulatory Visit: Payer: Self-pay | Admitting: Family Medicine

## 2023-03-26 DIAGNOSIS — G4709 Other insomnia: Secondary | ICD-10-CM

## 2023-04-06 DIAGNOSIS — H2513 Age-related nuclear cataract, bilateral: Secondary | ICD-10-CM | POA: Diagnosis not present

## 2023-04-11 ENCOUNTER — Ambulatory Visit: Payer: No Typology Code available for payment source | Admitting: Dermatology

## 2023-04-11 ENCOUNTER — Other Ambulatory Visit: Payer: Self-pay | Admitting: Family Medicine

## 2023-04-11 DIAGNOSIS — I10 Essential (primary) hypertension: Secondary | ICD-10-CM

## 2023-04-18 ENCOUNTER — Other Ambulatory Visit: Payer: Self-pay | Admitting: Family Medicine

## 2023-04-25 ENCOUNTER — Other Ambulatory Visit: Payer: Self-pay | Admitting: Family Medicine

## 2023-04-25 DIAGNOSIS — E7800 Pure hypercholesterolemia, unspecified: Secondary | ICD-10-CM

## 2023-05-02 ENCOUNTER — Encounter: Payer: Self-pay | Admitting: Family Medicine

## 2023-05-04 ENCOUNTER — Other Ambulatory Visit (HOSPITAL_COMMUNITY)
Admission: RE | Admit: 2023-05-04 | Discharge: 2023-05-04 | Disposition: A | Discharge status: Home / Self Care | Payer: Medicare Other | Source: Ambulatory Visit | Attending: Nurse Practitioner | Admitting: Nurse Practitioner

## 2023-05-04 ENCOUNTER — Ambulatory Visit (INDEPENDENT_AMBULATORY_CARE_PROVIDER_SITE_OTHER): Payer: Medicare Other | Admitting: Nurse Practitioner

## 2023-05-04 ENCOUNTER — Encounter: Payer: Self-pay | Admitting: Nurse Practitioner

## 2023-05-04 ENCOUNTER — Other Ambulatory Visit: Payer: Self-pay

## 2023-05-04 VITALS — BP 118/72 | HR 87 | Temp 98.5°F | Resp 14 | Ht 65.0 in | Wt 161.5 lb

## 2023-05-04 DIAGNOSIS — N898 Other specified noninflammatory disorders of vagina: Secondary | ICD-10-CM | POA: Diagnosis present

## 2023-05-04 LAB — POCT URINALYSIS DIPSTICK
Appearance: NORMAL
Bilirubin, UA: NEGATIVE
Blood, UA: NEGATIVE
Glucose, UA: NEGATIVE
Ketones, UA: NEGATIVE
Leukocytes, UA: NEGATIVE
Nitrite, UA: NEGATIVE
Protein, UA: NEGATIVE
Spec Grav, UA: 1.015 (ref 1.010–1.025)
Urobilinogen, UA: 0.2 U/dL
pH, UA: 6.5 (ref 5.0–8.0)

## 2023-05-04 MED ORDER — NYSTATIN 100000 UNIT/GM EX CREA
1.0000 | TOPICAL_CREAM | Freq: Two times a day (BID) | CUTANEOUS | 0 refills | Status: DC
Start: 2023-05-04 — End: 2023-06-07

## 2023-05-08 ENCOUNTER — Other Ambulatory Visit: Payer: Self-pay | Admitting: Nurse Practitioner

## 2023-05-08 ENCOUNTER — Encounter: Payer: Self-pay | Admitting: Nurse Practitioner

## 2023-05-08 DIAGNOSIS — N898 Other specified noninflammatory disorders of vagina: Secondary | ICD-10-CM

## 2023-05-08 LAB — CERVICOVAGINAL ANCILLARY ONLY
Bacterial Vaginitis (gardnerella): NEGATIVE
Candida Glabrata: NEGATIVE
Candida Vaginitis: NEGATIVE
Chlamydia: NEGATIVE
Comment: NEGATIVE
Comment: NEGATIVE
Comment: NEGATIVE
Comment: NEGATIVE
Comment: NEGATIVE
Comment: NORMAL
Neisseria Gonorrhea: NEGATIVE
Trichomonas: NEGATIVE

## 2023-05-08 MED ORDER — TRIAMCINOLONE ACETONIDE 0.1 % EX CREA
1.0000 | TOPICAL_CREAM | Freq: Two times a day (BID) | CUTANEOUS | 0 refills | Status: DC
Start: 2023-05-08 — End: 2023-07-03

## 2023-06-02 ENCOUNTER — Other Ambulatory Visit: Payer: Self-pay | Admitting: Family Medicine

## 2023-06-02 DIAGNOSIS — I10 Essential (primary) hypertension: Secondary | ICD-10-CM

## 2023-06-07 NOTE — Progress Notes (Signed)
Name: Hannah Lindsey   MRN: [id number]    DOB: 01/21/1946   Date:06/08/2023       Progress Note  Subjective  Chief Complaint  Follow Up  HPI  HTN: she is currently on  norvasc 5 mg and low dose valsartan hctz 80/12.5 mg . BP is towards low end of normal. She is very active and denies orthostatic changes. Denies chest pain or palpitation  Headache: still present, top of the head, intermittent, nortriptyline did not really help and caused constipation, so she stopped medication.  Mild and throbbing like pain radiates from left  parietal area to her nose but now is more like a dull ache and no longer constant  She states mother and two siblings with Parkinson's disease.  She was seen by neurologist , Dr. Malvin Johns, she was given Effexor two 75 mg daily , she states symptoms improved temporarily but had to keep increasing dose, she had a problem filling rx and had severe withdrawal so she decided to stop taking. She had multiple MRI's and normal . Symptoms are slightly better and resolves with Tylenol prn   Alzheimer's study: going to have MRI brain done soon, also genetic testing. 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 Unchanged Occasionally takes baclofen for lower back pain    Hyperlipidemia: she is taking Crestor and last LDL at goal, no side effects of medications . Continue medications    Osteopenia:high FRAX score is above goal now, she is taking Alendronate weekly   Perennial allergic rhinitis: she stopped taking all medications unless if severe rhinorrhea. 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   Insomnia: doing well on seroquel, able to fall and stay asleep    Patient Active Problem List   Diagnosis Date Noted   Senile purpura (HCC) 12/08/2020   Tinnitus of both ears 12/07/2020   Anxiety, generalized 10/20/2020   Chronic daily headache 08/22/2020   Perennial allergic rhinitis with seasonal variation 11/09/2017   Osteopenia after menopause 11/09/2017   Post-menopausal 09/06/2016   Hyperlipidemia 06/19/2016   Essential hypertension 06/04/2015    Past Surgical History:  Procedure Laterality Date   COLONOSCOPY WITH PROPOFOL N/A 08/19/2018   Procedure: COLONOSCOPY WITH PROPOFOL;  Surgeon: Scot Jun, MD;  Location: [facility] ENDOSCOPY;  Service: Endoscopy;  Laterality: N/A;   TONSILLECTOMY      Family History  Problem Relation Age of Onset   Fibromyalgia Mother    Heart Problems Mother    Dementia Father    Heart Problems Father    Heart disease Father    Breast cancer Maternal Grandmother    Cancer Maternal Grandmother    Cancer Maternal Aunt    Breast cancer Maternal Aunt     Social History   Tobacco Use   Smoking status: Never   Smokeless tobacco: Never  Substance Use Topics   Alcohol use: Yes    Alcohol/week: 1.0 standard drink of alcohol    Types: 1 Glasses of wine per  week    Comment: nightly     Current Outpatient Medications:    cetirizine (ZYRTEC) 10 MG tablet, Take 10 mg by mouth daily., Disp: , Rfl:    Cholecalciferol (VITAMIN D) 50 MCG (2000 UT) CAPS, Take 1 capsule by mouth daily at 12 noon., Disp: , Rfl:    estradiol (ESTRACE) 0.1 MG/GM vaginal cream, Place 1 Applicatorful vaginally at bedtime., Disp: 42.5 g, Rfl: 12   fluticasone (FLONASE) 50 MCG/ACT nasal spray, Place 2 sprays into both nostrils daily., Disp: 48 g, Rfl: 1   rosuvastatin (CRESTOR) 20 MG tablet, TAKE 1 TABLET BY MOUTH EVERY DAY, Disp: 90 tablet, Rfl: 1   triamcinolone cream (KENALOG)  0.1 %, Apply 1 Application topically 2 (two) times daily., Disp: 30 g, Rfl: 0   alendronate (FOSAMAX) 35 MG tablet, Take 1 tablet (35 mg total) by mouth every 7 (seven) days. Take with a full glass of water on an empty stomach., Disp: 12 tablet, Rfl: 3   amLODipine (NORVASC) 5 MG tablet, Take 1 tablet (5 mg total) by mouth daily., Disp: 90 tablet, Rfl: 0   QUEtiapine (SEROQUEL) 25 MG tablet, Take 1 tablet (25 mg total) by mouth at bedtime. For sleep, Disp: 90 tablet, Rfl: 1   valsartan-hydrochlorothiazide (DIOVAN-HCT) 80-12.5 MG tablet, Take 1 tablet by mouth daily., Disp: 90 tablet, Rfl: 0  No Known Allergies  I personally reviewed active problem list, medication list, allergies, family history, social history, health maintenance with the patient/caregiver today.   ROS  Ten systems reviewed and is negative except as mentioned in HPI    Objective  Vitals:   06/08/23 0824  BP: 116/72  Pulse: 77  Resp: 14  Temp: 97.6 F (36.4 C)  TempSrc: Oral  SpO2: 94%  Weight: 172 lb 14.4 oz (78.4 kg)  Height: 5\' 5"  (1.651 m)    Body mass index is 28.77 kg/m.  Physical Exam  Constitutional: Patient appears well-developed and well-nourished.  No distress.  HEENT: head atraumatic, normocephalic, pupils equal and reactive to light, neck supple Cardiovascular: Normal rate, regular rhythm and normal heart sounds.  No murmur heard. No BLE edema. Pulmonary/Chest: Effort normal and breath sounds normal. No respiratory distress. Abdominal: Soft.  There is no tenderness. Psychiatric: Patient has a normal mood and affect. behavior is normal. Judgment and thought content normal.    PHQ2/9:    06/08/2023    8:25 AM 05/04/2023   11:23 AM 01/18/2023   10:24 AM 12/07/2022    8:09 AM 06/08/2022    8:10 AM  Depression screen PHQ 2/9  Decreased Interest 0 0 0 0 0  Down, Depressed, Hopeless 0 0 0 0 0  PHQ - 2 Score 0 0 0 0 0  Altered sleeping 0   0 0  Tired, decreased energy 0   0 0  Change in  appetite 0   0 0  Feeling bad or failure about yourself  0   0 0  Trouble concentrating 0   0 0  Moving slowly or fidgety/restless 0   0 0  Suicidal thoughts 0   0 0  PHQ-9 Score 0   0 0  Difficult doing work/chores     Not difficult at all    phq 9 is negative   Fall Risk:    06/08/2023    8:25 AM 05/04/2023   11:22 AM 01/14/2023   10:44 AM 12/07/2022    8:09 AM 06/08/2022    8:10 AM  Fall Risk   Falls  in the past year? 0 0 0 0 0  Number falls in past yr:  0  0 0  Injury with Fall?  0  0 0  Risk for fall due to : No Fall Risks No Fall Risks No Fall Risks No Fall Risks   Follow up Falls prevention discussed Falls prevention discussed Education provided;Falls prevention discussed Falls prevention discussed       Functional Status Survey: Is the patient deaf or have difficulty hearing?: No Does the patient have difficulty seeing, even when wearing glasses/contacts?: No Does the patient have difficulty concentrating, remembering, or making decisions?: No Does the patient have difficulty walking or climbing stairs?: No Does the patient have difficulty dressing or bathing?: No Does the patient have difficulty doing errands alone such as visiting a doctor's office or shopping?: No    Assessment & Plan  1. Senile purpura (HCC)  Reassurance given   2. Osteopenia after menopause  - alendronate (FOSAMAX) 35 MG tablet; Take 1 tablet (35 mg total) by mouth every 7 (seven) days. Take with a full glass of water on an empty stomach.  Dispense: 12 tablet; Refill: 3  3. Pure hypercholesterolemia  On diet only   4. Need for immunization against influenza  - Flu Vaccine Trivalent High Dose (Fluad)  5. Essential hypertension  - valsartan-hydrochlorothiazide (DIOVAN-HCT) 80-12.5 MG tablet; Take 1 tablet by mouth daily.  Dispense: 90 tablet; Refill: 0 - amLODipine (NORVASC) 5 MG tablet; Take 1 tablet (5 mg total) by mouth daily.  Dispense: 90 tablet; Refill: 0  6. Seborrhea  capitis  Improved   7. Daily headache  stable  8. Vaginal atrophy  - estradiol (ESTRACE) 0.1 MG/GM vaginal cream; Place 1 Applicatorful vaginally at bedtime.  Dispense: 42.5 g; Refill: 12  9. Other insomnia  - QUEtiapine (SEROQUEL) 25 MG tablet; Take 1 tablet (25 mg total) by mouth at bedtime. For sleep  Dispense: 90 tablet; Refill: 1  10. Breast cancer screening by mammogram  - MM 3D SCREENING MAMMOGRAM BILATERAL BREAST; Future

## 2023-06-08 ENCOUNTER — Ambulatory Visit (INDEPENDENT_AMBULATORY_CARE_PROVIDER_SITE_OTHER): Payer: Medicare Other | Admitting: Family Medicine

## 2023-06-08 ENCOUNTER — Encounter: Payer: Self-pay | Admitting: Family Medicine

## 2023-06-08 VITALS — BP 116/72 | HR 77 | Temp 97.6°F | Resp 14 | Ht 65.0 in | Wt 172.9 lb

## 2023-06-08 DIAGNOSIS — N952 Postmenopausal atrophic vaginitis: Secondary | ICD-10-CM

## 2023-06-08 DIAGNOSIS — M8580 Other specified disorders of bone density and structure, unspecified site: Secondary | ICD-10-CM | POA: Diagnosis not present

## 2023-06-08 DIAGNOSIS — R519 Headache, unspecified: Secondary | ICD-10-CM

## 2023-06-08 DIAGNOSIS — D692 Other nonthrombocytopenic purpura: Secondary | ICD-10-CM | POA: Diagnosis not present

## 2023-06-08 DIAGNOSIS — I10 Essential (primary) hypertension: Secondary | ICD-10-CM | POA: Diagnosis not present

## 2023-06-08 DIAGNOSIS — E7800 Pure hypercholesterolemia, unspecified: Secondary | ICD-10-CM

## 2023-06-08 DIAGNOSIS — Z780 Asymptomatic menopausal state: Secondary | ICD-10-CM

## 2023-06-08 DIAGNOSIS — L210 Seborrhea capitis: Secondary | ICD-10-CM

## 2023-06-08 DIAGNOSIS — Z1231 Encounter for screening mammogram for malignant neoplasm of breast: Secondary | ICD-10-CM

## 2023-06-08 DIAGNOSIS — Z23 Encounter for immunization: Secondary | ICD-10-CM

## 2023-06-08 DIAGNOSIS — G4709 Other insomnia: Secondary | ICD-10-CM | POA: Diagnosis not present

## 2023-06-08 MED ORDER — ESTRADIOL 0.1 MG/GM VA CREA
1.0000 | TOPICAL_CREAM | Freq: Every day | VAGINAL | 12 refills | Status: AC
Start: 2023-06-08 — End: ?

## 2023-06-08 MED ORDER — QUETIAPINE FUMARATE 25 MG PO TABS: 25.0000 mg | ORAL_TABLET | Freq: Every day | ORAL | 1 refills | Status: DC

## 2023-06-08 MED ORDER — AMLODIPINE BESYLATE 5 MG PO TABS: 5.0000 mg | ORAL_TABLET | Freq: Every day | ORAL | 0 refills | Status: DC

## 2023-06-08 MED ORDER — ALENDRONATE SODIUM 35 MG PO TABS: 35.0000 mg | ORAL_TABLET | ORAL | 3 refills | Status: AC

## 2023-06-08 MED ORDER — VALSARTAN-HYDROCHLOROTHIAZIDE 80-12.5 MG PO TABS: 1.0000 | ORAL_TABLET | Freq: Every day | ORAL | 0 refills | Status: DC

## 2023-07-02 ENCOUNTER — Encounter: Payer: Self-pay | Admitting: Family Medicine

## 2023-07-03 ENCOUNTER — Encounter: Payer: Self-pay | Admitting: Physician Assistant

## 2023-07-03 ENCOUNTER — Ambulatory Visit: Payer: Medicare Other | Admitting: Physician Assistant

## 2023-07-03 VITALS — BP 120/68 | HR 76 | Temp 97.9°F | Resp 16 | Ht 65.0 in | Wt 170.8 lb

## 2023-07-03 DIAGNOSIS — N952 Postmenopausal atrophic vaginitis: Secondary | ICD-10-CM | POA: Insufficient documentation

## 2023-07-03 DIAGNOSIS — N905 Atrophy of vulva: Secondary | ICD-10-CM | POA: Diagnosis not present

## 2023-07-03 DIAGNOSIS — N898 Other specified noninflammatory disorders of vagina: Secondary | ICD-10-CM

## 2023-07-03 MED ORDER — TRIAMCINOLONE ACETONIDE 0.1 % EX CREA: 1.0000 | TOPICAL_CREAM | Freq: Two times a day (BID) | CUTANEOUS | 0 refills | Status: DC

## 2023-07-03 NOTE — Progress Notes (Signed)
Acute Office Visit   Patient: Hannah Lindsey   DOB: 1945-09-25   77 y.o. Female  MRN: [id number] Visit Date: 07/03/2023  Today's healthcare provider: Oswaldo Conroy Jahmeir Geisen, PA-C  Introduced myself to the patient as a Secondary school teacher and provided education on APPs in clinical practice.    Chief Complaint  Patient presents with   Vaginal Itching    Itching and irritated on going since last visit not any better   Subjective    HPI HPI     Vaginal Itching    Additional comments: Itching and irritated on going since last visit not any better      Last edited by Forde Radon, CMA on 07/03/2023  9:43 AM.     She was seen for this by PCP and another practice provider Was provided with Triamcinolone cream first then, estradiol cream for atrophy- reports minimal improvement with estradiol cream in regards to dryness She reports symptoms have been persistent since Oct She has tried oatmeal baths at home  She reports there is stinging and mild discomfort that is constant  She is still using estradiol cream as directed - has been using for about 3 weeks so far She feels like there is some improvement but not resolving       Medications: Outpatient Medications Prior to Visit  Medication Sig   alendronate (FOSAMAX) 35 MG tablet Take 1 tablet (35 mg total) by mouth every 7 (seven) days. Take with a full glass of water on an empty stomach.   amLODipine (NORVASC) 5 MG tablet Take 1 tablet (5 mg total) by mouth daily.   cetirizine (ZYRTEC) 10 MG tablet Take 10 mg by mouth daily.   Cholecalciferol (VITAMIN D) 50 MCG (2000 UT) CAPS Take 1 capsule by mouth daily at 12 noon.   estradiol (ESTRACE) 0.1 MG/GM vaginal cream Place 1 Applicatorful vaginally at bedtime.   fluticasone (FLONASE) 50 MCG/ACT nasal spray Place 2 sprays into both nostrils daily.   QUEtiapine (SEROQUEL) 25 MG tablet Take 1 tablet (25 mg total) by mouth at bedtime. For sleep   rosuvastatin (CRESTOR) 20 MG tablet  TAKE 1 TABLET BY MOUTH EVERY DAY   triamcinolone cream (KENALOG) 0.1 % Apply 1 Application topically 2 (two) times daily.   valsartan-hydrochlorothiazide (DIOVAN-HCT) 80-12.5 MG tablet Take 1 tablet by mouth daily.   No facility-administered medications prior to visit.    Review of Systems  Genitourinary:        Vaginal itching         Objective    BP 120/68   Pulse 76   Temp 97.9 F (36.6 C) (Oral)   Resp 16   Ht 5\' 5"  (1.651 m)   Wt 170 lb 12.8 oz (77.5 kg)   SpO2 96%   BMI 28.42 kg/m     Physical Exam Vitals reviewed.  Constitutional:      General: She is awake.     Appearance: Normal appearance. She is well-developed and well-groomed.  HENT:     Head: Normocephalic and atraumatic.  Eyes:     General: Lids are normal. Gaze aligned appropriately.  Pulmonary:     Effort: Pulmonary effort is normal.  Genitourinary:    Tanner stage (genital): 5.     Comments: Patient declined chaperone   Exam consistent with vulvovaginal atrophy - redness along vaginal opening and decreased prominence of labia minora and majora  Musculoskeletal:     Cervical back: Normal range of motion.  Neurological:     Mental Status: She is alert.  Psychiatric:        Behavior: Behavior is cooperative.       No results found for any visits on 07/03/23.  Assessment & Plan      No follow-ups on file.       Problem List Items Addressed This Visit       Genitourinary   Vaginal atrophy - Primary   Vulvar atrophy   Other Visit Diagnoses     Vaginal itching          Acute on chronic, ongoing Mildly improving She reports some improvement in symptoms with current treatment  Exam is consistent with vulvovaginal atrophy today- redness and irritation present along vaginal opening with reduced prominence of labia majora and minora Recommend she continues with Estradiol cream daily as directed- discussed this can take 4-6 weeks to show full effects Will provide triamcinolone  cream to assist with more severe discomfort - reviewed not to use this for longer than 2 weeks due to potential side effects Follow up as needed for persistent or progressing symptoms    No follow-ups on file.   I, Ariyah Sedlack E Dorr Perrot, PA-C, have reviewed all documentation for this visit. The documentation on 07/03/23 for the exam, diagnosis, procedures, and orders are all accurate and complete.   Jacquelin Hawking, MHS, PA-C [facility]

## 2023-07-25 ENCOUNTER — Ambulatory Visit
Admission: RE | Admit: 2023-07-25 | Discharge: 2023-07-25 | Disposition: A | Discharge status: Home / Self Care | Payer: Medicare Other | Source: Ambulatory Visit | Attending: Family Medicine | Admitting: Family Medicine

## 2023-07-25 DIAGNOSIS — Z1231 Encounter for screening mammogram for malignant neoplasm of breast: Secondary | ICD-10-CM | POA: Insufficient documentation

## 2023-11-29 ENCOUNTER — Other Ambulatory Visit: Payer: Self-pay | Admitting: Family Medicine

## 2023-11-29 DIAGNOSIS — I10 Essential (primary) hypertension: Secondary | ICD-10-CM

## 2023-12-02 ENCOUNTER — Other Ambulatory Visit: Payer: Self-pay | Admitting: Family Medicine

## 2023-12-02 DIAGNOSIS — E7800 Pure hypercholesterolemia, unspecified: Secondary | ICD-10-CM

## 2023-12-04 ENCOUNTER — Other Ambulatory Visit: Payer: Self-pay | Admitting: Family Medicine

## 2023-12-04 DIAGNOSIS — G4709 Other insomnia: Secondary | ICD-10-CM

## 2023-12-12 ENCOUNTER — Ambulatory Visit (INDEPENDENT_AMBULATORY_CARE_PROVIDER_SITE_OTHER): Payer: Self-pay | Admitting: Family Medicine

## 2023-12-12 ENCOUNTER — Encounter: Payer: Self-pay | Admitting: Family Medicine

## 2023-12-12 VITALS — BP 130/74 | HR 85 | Resp 16 | Ht 65.0 in | Wt 167.3 lb

## 2023-12-12 DIAGNOSIS — G4709 Other insomnia: Secondary | ICD-10-CM | POA: Diagnosis not present

## 2023-12-12 DIAGNOSIS — E559 Vitamin D deficiency, unspecified: Secondary | ICD-10-CM | POA: Diagnosis not present

## 2023-12-12 DIAGNOSIS — J302 Other seasonal allergic rhinitis: Secondary | ICD-10-CM

## 2023-12-12 DIAGNOSIS — R519 Headache, unspecified: Secondary | ICD-10-CM

## 2023-12-12 DIAGNOSIS — E7800 Pure hypercholesterolemia, unspecified: Secondary | ICD-10-CM

## 2023-12-12 DIAGNOSIS — M5030 Other cervical disc degeneration, unspecified cervical region: Secondary | ICD-10-CM

## 2023-12-12 DIAGNOSIS — I10 Essential (primary) hypertension: Secondary | ICD-10-CM | POA: Diagnosis not present

## 2023-12-12 DIAGNOSIS — M8580 Other specified disorders of bone density and structure, unspecified site: Secondary | ICD-10-CM

## 2023-12-12 DIAGNOSIS — Z780 Asymptomatic menopausal state: Secondary | ICD-10-CM

## 2023-12-12 DIAGNOSIS — J3089 Other allergic rhinitis: Secondary | ICD-10-CM

## 2023-12-12 MED ORDER — FLUTICASONE PROPIONATE 50 MCG/ACT NA SUSP: 2.0000 | Freq: Every day | NASAL | 1 refills | Status: AC

## 2023-12-12 MED ORDER — QUETIAPINE FUMARATE 25 MG PO TABS: 25.0000 mg | ORAL_TABLET | Freq: Every day | ORAL | 1 refills | Status: AC

## 2023-12-12 MED ORDER — AMLODIPINE BESYLATE 5 MG PO TABS: 5.0000 mg | ORAL_TABLET | Freq: Every day | ORAL | 1 refills | Status: DC

## 2023-12-12 MED ORDER — BACLOFEN 10 MG PO TABS: 10.0000 mg | ORAL_TABLET | Freq: Three times a day (TID) | ORAL | 0 refills | Status: AC

## 2023-12-12 MED ORDER — ROSUVASTATIN CALCIUM 20 MG PO TABS: 20.0000 mg | ORAL_TABLET | Freq: Every day | ORAL | 1 refills | Status: DC

## 2023-12-12 MED ORDER — VALSARTAN-HYDROCHLOROTHIAZIDE 80-12.5 MG PO TABS: 1.0000 | ORAL_TABLET | Freq: Every day | ORAL | 1 refills | Status: DC

## 2023-12-12 NOTE — Progress Notes (Signed)
 Name: Hannah Lindsey   MRN: [id number]    DOB: August 06, 1946   Date:12/12/2023       Progress Note  Subjective  Chief Complaint  Chief Complaint  Patient presents with   Medical Management of Chronic Issues   Discussed the use of AI scribe software for clinical note transcription with the patient, who gave verbal consent to proceed.  History of Present Illness Hannah Lindsey is a 78 year old female with hypertension and hyperlipidemia who presents for a follow-up visit and medication refills.  She is in the process of finding a new healthcare provider in the [place] area Her blood pressure is well-controlled with her current medication regimen, which includes amlodipine  5 mg and hydrochlorothiazide  12.5 mg. No side effects such as dizziness, chest pain, or palpitations.   She is also taking rosuvastatin  20 mg for hyperlipidemia and reports no side effects.  She is participating in an Alzheimer's study, which has been transferred to the [place] area. MRIs are part of the study protocol, especially if she is on the placebo.  She has a history of cervical spine degenerative disc disease and occasionally takes Tylenol for neck pain. She used baclofen  during her recent move for muscle strain and finds it helpful for temporary relief. She does not currently have a prescription for baclofen  but used some from her husband's supply during the move.  For insomnia, she takes quetiapine  at night, which helps her fall and stay asleep without side effects.   She is on alendronate  35 mg  once a week for osteopenia and reports no side effects. She ensures a high calcium  diet and takes vitamin D  2000 units daily.  She experiences daily  headaches, described as mild discomfort on the left side of her head. She has tried various treatments in the past, including nortriptyline  and Effexor, but currently manages with Tylenol as needed. She has a family history of brain tumors, which initially concerned her,  but her MRI results have been normal.  She has a history of allergies and uses fluticasone  nasal spray as needed. She also takes over-the-counter Zyrtec  or Claritin  for symptom relief. She reports a runny nose and congestion, which she manages with fluticasone  nasal spray and over-the-counter antihistamines like Zyrtec  or Claritin .    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   Senile purpura (HCC) 12/08/2020   Tinnitus of both ears 12/07/2020   Anxiety, generalized 10/20/2020   Chronic daily headache 08/22/2020   Perennial allergic rhinitis with seasonal variation 11/09/2017   Osteopenia after menopause 11/09/2017   Post-menopausal 09/06/2016   Hyperlipidemia 06/19/2016   Essential hypertension 06/04/2015    Past Surgical History:  Procedure Laterality Date   COLONOSCOPY WITH PROPOFOL  N/A 08/19/2018   Procedure: COLONOSCOPY WITH PROPOFOL ;  Surgeon: Cassie Click, MD;  Location: [facility] ENDOSCOPY;  Service: Endoscopy;  Laterality: N/A;   TONSILLECTOMY      Family History  Problem Relation Age of Onset   Fibromyalgia Mother    Heart Problems Mother    Dementia Father    Heart Problems Father    Heart disease Father    Breast cancer Maternal Grandmother    Cancer Maternal Grandmother    Cancer Maternal Aunt    Breast cancer Maternal Aunt     Social History   Tobacco Use   Smoking status: Never   Smokeless tobacco: Never  Substance Use Topics   Alcohol use: Yes  Alcohol/week: 1.0 standard drink of alcohol    Types: 1 Glasses of wine per week    Comment: nightly     Current Outpatient Medications:    alendronate  (FOSAMAX ) 35 MG tablet, Take 1 tablet (35 mg total) by mouth every 7 (seven) days. Take with a full glass of water on an empty stomach., Disp: 12 tablet, Rfl: 3   amLODipine  (NORVASC ) 5 MG tablet, Take 1 tablet (5 mg total) by mouth daily., Disp: 90 tablet, Rfl: 0   cetirizine  (ZYRTEC ) 10  MG tablet, Take 10 mg by mouth daily., Disp: , Rfl:    Cholecalciferol (VITAMIN D ) 50 MCG (2000 UT) CAPS, Take 1 capsule by mouth daily at 12 noon., Disp: , Rfl:    estradiol  (ESTRACE ) 0.1 MG/GM vaginal cream, Place 1 Applicatorful vaginally at bedtime., Disp: 42.5 g, Rfl: 12   fluticasone  (FLONASE ) 50 MCG/ACT nasal spray, Place 2 sprays into both nostrils daily., Disp: 48 g, Rfl: 1   QUEtiapine  (SEROQUEL ) 25 MG tablet, TAKE 1 TABLET (25 MG TOTAL) BY MOUTH AT BEDTIME. FOR SLEEP, Disp: 30 tablet, Rfl: 0   rosuvastatin  (CRESTOR ) 20 MG tablet, TAKE 1 TABLET BY MOUTH EVERY DAY, Disp: 30 tablet, Rfl: 0   valsartan -hydrochlorothiazide  (DIOVAN -HCT) 80-12.5 MG tablet, TAKE 1 TABLET BY MOUTH EVERY DAY, Disp: 30 tablet, Rfl: 0   triamcinolone  cream (KENALOG ) 0.1 %, Apply 1 Application topically 2 (two) times daily. (Patient not taking: Reported on 12/12/2023), Disp: 30 g, Rfl: 0  No Known Allergies  I personally reviewed active problem list, medication list, allergies, family history with the patient/caregiver today.   ROS  Ten systems reviewed and is negative except as mentioned in HPI    Objective Physical Exam Constitutional: Patient appears well-developed and well-nourished.  No distress.  HEENT: head atraumatic, normocephalic, pupils equal and reactive to light, neck supple Cardiovascular: Normal rate, regular rhythm and normal heart sounds.  No murmur heard. No BLE edema. Pulmonary/Chest: Effort normal and breath sounds normal. No respiratory distress. Abdominal: Soft.  There is no tenderness. Psychiatric: Patient has a normal mood and affect. behavior is normal. Judgment and thought content normal.     Vitals:   12/12/23 0801  BP: 130/74  Pulse: 85  Resp: 16  SpO2: 96%  Weight: 167 lb 4.8 oz (75.9 kg)  Height: 5\' 5"  (1.651 m)    Body mass index is 27.84 kg/m.   PHQ2/9:    12/12/2023    7:57 AM 07/03/2023    9:37 AM 06/08/2023    8:25 AM 05/04/2023   11:23 AM 01/18/2023   10:24  AM  Depression screen PHQ 2/9  Decreased Interest 0 0 0 0 0  Down, Depressed, Hopeless 0 0 0 0 0  PHQ - 2 Score 0 0 0 0 0  Altered sleeping   0    Tired, decreased energy   0    Change in appetite   0    Feeling bad or failure about yourself    0    Trouble concentrating   0    Moving slowly or fidgety/restless   0    Suicidal thoughts   0    PHQ-9 Score   0      phq 9 is negative  Fall Risk:    12/12/2023    7:57 AM 07/03/2023    9:37 AM 06/08/2023    8:25 AM 05/04/2023   11:22 AM 01/14/2023   10:44 AM  Fall Risk   Falls in the past year?  0 0 0 0 0  Number falls in past yr: 0 0  0   Injury with Fall? 0 0  0   Risk for fall due to : No Fall Risks No Fall Risks No Fall Risks No Fall Risks No Fall Risks  Follow up Falls prevention discussed;Education provided;Falls evaluation completed Falls prevention discussed;Education provided;Falls evaluation completed Falls prevention discussed Falls prevention discussed Education provided;Falls prevention discussed    Assessment & Plan   Hypertension Hypertension controlled with current medications. - Continue current antihypertensive medications: amlodipine  5 mg and hydrochlorothiazide  12.5 mg. - Provide 90-day supply of medications.  Hyperlipidemia Hyperlipidemia managed with rosuvastatin . - Continue rosuvastatin  20 mg. - Provide 90-day supply of medication.  Osteopenia Osteopenia managed with alendronate . Emphasized preventive measures. - Continue alendronate  once weekly. - Ensure high calcium  diet and daily vitamin D  supplementation.  Vitamin D  deficiency Vitamin D  deficiency managed with supplementation. - Check vitamin D  level today. - Continue vitamin D  2000 units daily.  Allergic rhinitis Allergic rhinitis managed with fluticasone  and antihistamines. - Send prescription for fluticasone  nasal spray to pharmacy. - Continue use of over-the-counter antihistamines as needed.  Insomnia Insomnia managed with quetiapine .  Also used for husband's anxiety. - Continue quetiapine  at night.  Headache Chronic mild headache managed with Tylenol. MRI normal. - Manage with Tylenol as needed for discomfort.  Cervical disc disorder Cervical disc disorder with occasional neck pain managed with Tylenol and baclofen . - Prescribe baclofen  30 tablets for use during physical strain.

## 2023-12-13 LAB — CBC WITH DIFFERENTIAL/PLATELET
Absolute Lymphocytes: 1658 {cells}/uL (ref 850–3900)
Absolute Monocytes: 429 {cells}/uL (ref 200–950)
Basophils Absolute: 13 {cells}/uL (ref 0–200)
Basophils Relative: 0.2 %
Eosinophils Absolute: 39 {cells}/uL (ref 15–500)
Eosinophils Relative: 0.6 %
HCT: 41.7 % (ref 35.0–45.0)
Hemoglobin: 14.1 g/dL (ref 11.7–15.5)
MCH: 28.8 pg (ref 27.0–33.0)
MCHC: 33.8 g/dL (ref 32.0–36.0)
MCV: 85.1 fL (ref 80.0–100.0)
MPV: 9.1 fL (ref 7.5–12.5)
Monocytes Relative: 6.6 %
Neutro Abs: 4362 {cells}/uL (ref 1500–7800)
Neutrophils Relative %: 67.1 %
Platelets: 181 10*3/uL (ref 140–400)
RBC: 4.9 10*6/uL (ref 3.80–5.10)
RDW: 12.3 % (ref 11.0–15.0)
Total Lymphocyte: 25.5 %
WBC: 6.5 10*3/uL (ref 3.8–10.8)

## 2023-12-13 LAB — LIPID PANEL
Cholesterol: 151 mg/dL (ref <200)
HDL: 51 mg/dL (ref >=50)
LDL Cholesterol (Calc): 79 mg/dL
Non-HDL Cholesterol (Calc): 100 mg/dL (ref <130)
Total CHOL/HDL Ratio: 3 (calc) (ref <5.0)
Triglycerides: 117 mg/dL (ref <150)

## 2023-12-13 LAB — COMPREHENSIVE METABOLIC PANEL WITH GFR
AG Ratio: 1.9 (calc) (ref 1.0–2.5)
ALT: 15 U/L (ref 6–29)
AST: 19 U/L (ref 10–35)
Albumin: 4.8 g/dL (ref 3.6–5.1)
Alkaline phosphatase (APISO): 61 U/L (ref 37–153)
BUN: 14 mg/dL (ref 7–25)
CO2: 31 mmol/L (ref 20–32)
Calcium: 10.1 mg/dL (ref 8.6–10.4)
Chloride: 102 mmol/L (ref 98–110)
Creat: 0.82 mg/dL (ref 0.60–1.00)
Globulin: 2.5 g/dL (ref 1.9–3.7)
Glucose, Bld: 105 mg/dL — ABNORMAL HIGH (ref 65–99)
Potassium: 4.3 mmol/L (ref 3.5–5.3)
Sodium: 139 mmol/L (ref 135–146)
Total Bilirubin: 0.4 mg/dL (ref 0.2–1.2)
Total Protein: 7.3 g/dL (ref 6.1–8.1)
eGFR: 74 mL/min/{1.73_m2} (ref >=60)

## 2023-12-13 LAB — VITAMIN D 25 HYDROXY (VIT D DEFICIENCY, FRACTURES): Vit D, 25-Hydroxy: 47 ng/mL (ref 30–100)

## 2023-12-14 ENCOUNTER — Encounter: Payer: Self-pay | Admitting: Family Medicine

## 2024-02-15 ENCOUNTER — Encounter

## 2024-06-13 ENCOUNTER — Other Ambulatory Visit: Payer: Self-pay | Admitting: Family Medicine

## 2024-06-13 DIAGNOSIS — Z780 Asymptomatic menopausal state: Secondary | ICD-10-CM

## 2024-06-13 NOTE — Telephone Encounter (Signed)
 Pt needs an appt

## 2024-06-16 NOTE — Telephone Encounter (Signed)
Lvm for pt to call and schedule an appt  

## 2024-06-21 ENCOUNTER — Other Ambulatory Visit: Payer: Self-pay | Admitting: Family Medicine

## 2024-06-21 DIAGNOSIS — I10 Essential (primary) hypertension: Secondary | ICD-10-CM

## 2024-06-21 DIAGNOSIS — E7800 Pure hypercholesterolemia, unspecified: Secondary | ICD-10-CM

## 2024-06-24 NOTE — Telephone Encounter (Signed)
 Requested Prescriptions  Pending Prescriptions Disp Refills   amLODipine  (NORVASC ) 5 MG tablet [Pharmacy Med Name: AMLODIPINE  BESYLATE 5 MG TAB] 90 tablet 0    Sig: TAKE 1 TABLET BY MOUTH ONCE DAILY AS DIRECTED     Cardiovascular: Calcium  Channel Blockers 2 Failed - 06/24/2024 11:46 AM      Failed - Valid encounter within last 6 months    Recent Outpatient Visits           6 months ago Essential hypertension   [place] [facility], Dorette, MD              Passed - Last BP in normal range    BP Readings from Last 1 Encounters:  12/12/23 130/74         Passed - Last Heart Rate in normal range    Pulse Readings from Last 1 Encounters:  12/12/23 85          rosuvastatin  (CRESTOR ) 20 MG tablet [Pharmacy Med Name: ROSUVASTATIN  CALCIUM  20 MG TAB] 90 tablet 0    Sig: TAKE 1 TABLET BY MOUTH EVERY DAY     Cardiovascular:  Antilipid - Statins 2 Failed - 06/24/2024 11:46 AM      Failed - Lipid Panel in normal range within the last 12 months    Cholesterol, Total  Date Value Ref Range Status  04/19/2015 188 100 - 199 mg/dL Final   Cholesterol  Date Value Ref Range Status  12/12/2023 151 <200 mg/dL Final   LDL Cholesterol (Calc)  Date Value Ref Range Status  12/12/2023 79 mg/dL (calc) Final    Comment:    Reference range: <100 . Desirable range <100 mg/dL for primary prevention;   <70 mg/dL for patients with CHD or diabetic patients  with > or = 2 CHD risk factors. SABRA LDL-C is now calculated using the Martin-Hopkins  calculation, which is a validated novel method providing  better accuracy than the Friedewald equation in the  estimation of LDL-C.  Gladis APPLETHWAITE et al. SANDREA. 7986;689(80): 2061-2068  (http://education.[facility].com/faq/FAQ164)    HDL  Date Value Ref Range Status  12/12/2023 51 > OR = 50 mg/dL Final  90/87/7983 51 >60 mg/dL Final    Comment:    According to ATP-III Guidelines, HDL-C >59 mg/dL is considered a negative risk  factor for CHD.    Triglycerides  Date Value Ref Range Status  12/12/2023 117 <150 mg/dL Final         Passed - Cr in normal range and within 360 days    Creat  Date Value Ref Range Status  12/12/2023 0.82 0.60 - 1.00 mg/dL Final         Passed - Patient is not pregnant      Passed - Valid encounter within last 12 months    Recent Outpatient Visits           6 months ago Essential hypertension   [place] [facility] [place], Krich[place], MD               valsartan -hydrochlorothiazide  (DIOVAN -HCT) 80-12.5 MG tablet [Pharmacy Med Name: VALSARTAN -HCTZ 80-12.5 MG TAB] 90 tablet 0    Sig: TAKE 1 TABLET BY MOUTH EVERY DAY     Cardiovascular: ARB + Diuretic Combos Failed - 06/24/2024 11:46 AM      Failed - K in normal range and within 180 days    Potassium  Date Value Ref Range Status  12/12/2023 4.3 3.5 - 5.3 mmol/L Final  Failed - Na in normal range and within 180 days    Sodium  Date Value Ref Range Status  12/12/2023 139 135 - 146 mmol/L Final  04/19/2015 140 134 - 144 mmol/L Final         Failed - Cr in normal range and within 180 days    Creat  Date Value Ref Range Status  12/12/2023 0.82 0.60 - 1.00 mg/dL Final         Failed - eGFR is 10 or above and within 180 days    GFR, Est African American  Date Value Ref Range Status  12/08/2020 83 > OR = 60 mL/min/1.49m2 Final   GFR, Est Non African American  Date Value Ref Range Status  12/08/2020 72 > OR = 60 mL/min/1.67m2 Final   eGFR  Date Value Ref Range Status  12/12/2023 74 > OR = 60 mL/min/1.20m2 Final         Failed - Valid encounter within last 6 months    Recent Outpatient Visits           6 months ago Essential hypertension   [place] [facility], Dorette, MD              Passed - Patient is not pregnant      Passed - Last BP in normal range    BP Readings from Last 1 Encounters:  12/12/23 130/74
